# Patient Record
Sex: Female | Born: 1950
Health system: Southern US, Community
[De-identification: ages and names within clinical notes are randomized; demographics above are authoritative.]

## PROBLEM LIST (undated history)

## (undated) DIAGNOSIS — B1910 Unspecified viral hepatitis B without hepatic coma: Secondary | ICD-10-CM

## (undated) DIAGNOSIS — E785 Hyperlipidemia, unspecified: Secondary | ICD-10-CM

## (undated) DIAGNOSIS — I519 Heart disease, unspecified: Secondary | ICD-10-CM

## (undated) DIAGNOSIS — E119 Type 2 diabetes mellitus without complications: Secondary | ICD-10-CM

## (undated) DIAGNOSIS — I10 Essential (primary) hypertension: Secondary | ICD-10-CM

## (undated) HISTORY — DX: Type 2 diabetes mellitus without complications: E11.9

## (undated) HISTORY — DX: Hyperlipidemia, unspecified: E78.5

## (undated) HISTORY — DX: Unspecified viral hepatitis B without hepatic coma: B19.10

## (undated) HISTORY — DX: Heart disease, unspecified: I51.9

## (undated) HISTORY — PX: ABDOMINAL HYSTERECTOMY: SHX81

## (undated) HISTORY — DX: Essential (primary) hypertension: I10

---

## 2012-04-24 ENCOUNTER — Ambulatory Visit (INDEPENDENT_AMBULATORY_CARE_PROVIDER_SITE_OTHER): Payer: BC Managed Care – PPO | Admitting: Family Medicine

## 2012-04-24 ENCOUNTER — Other Ambulatory Visit: Payer: Self-pay | Admitting: Radiology

## 2012-04-24 ENCOUNTER — Ambulatory Visit: Payer: BC Managed Care – PPO

## 2012-04-24 VITALS — BP 172/100 | HR 82 | Temp 98.2°F | Resp 18 | Ht 59.5 in | Wt 135.0 lb

## 2012-04-24 DIAGNOSIS — R509 Fever, unspecified: Secondary | ICD-10-CM

## 2012-04-24 DIAGNOSIS — M79609 Pain in unspecified limb: Secondary | ICD-10-CM

## 2012-04-24 DIAGNOSIS — R05 Cough: Secondary | ICD-10-CM

## 2012-04-24 DIAGNOSIS — R059 Cough, unspecified: Secondary | ICD-10-CM

## 2012-04-24 DIAGNOSIS — J020 Streptococcal pharyngitis: Secondary | ICD-10-CM

## 2012-04-24 LAB — POCT CBC
HCT, POC: 39 % (ref 37.7–47.9)
Hemoglobin: 12.2 g/dL (ref 12.2–16.2)
Lymph, poc: 1.7 (ref 0.6–3.4)
MCH, POC: 29.6 pg (ref 27–31.2)
MCHC: 31.3 g/dL — AB (ref 31.8–35.4)
MCV: 94.6 fL (ref 80–97)
WBC: 4.1 10*3/uL — AB (ref 4.6–10.2)

## 2012-04-24 MED ORDER — MUCINEX DM 30-600 MG PO TB12: 1.0000 | ORAL_TABLET | Freq: Two times a day (BID) | ORAL | Status: DC | PRN

## 2012-04-24 MED ORDER — MELOXICAM 15 MG PO TABS: 15.0000 mg | ORAL_TABLET | Freq: Every day | ORAL | Status: DC

## 2012-04-24 MED ORDER — AZITHROMYCIN 250 MG PO TABS: ORAL_TABLET | ORAL | Status: DC

## 2012-04-24 NOTE — Patient Instructions (Addendum)
Fever, unspecified - Plan: POCT CBC, POCT Influenza A/B, DG Chest 2 View  Cough - Plan: POCT CBC, POCT Influenza A/B, DG Chest 2 View, azithromycin (ZITHROMAX Z-PAK) 250 MG tablet, Dextromethorphan-Guaifenesin (MUCINEX DM) 30-600 MG TB12  Streptococcal sore throat - Plan: POCT CBC, POCT Influenza A/B, DG Chest 2 View  Hand pain, unspecified laterality - Plan: meloxicam (MOBIC) 15 MG tablet    1.  Return in one month for recheck blood pressure.

## 2012-04-24 NOTE — Progress Notes (Signed)
[address]   [place], [place]  [address]   [phone]  Subjective:    Patient ID: Dominique Hayes, female    DOB: [birth year], 62 y.o.   MRN: [id number]  HPI This 62 y.o. female presents for evaluation of fever, cough.  Onset four days ago.  +fever; +cough with sputum production.  Has not checked temperpature; feels warm.  No ear pain; +ST painful.+rhionrrhea; +nasal congestion; +cough.  No v/d.  Works at [employer].  No medications at home.  From [place].  No tobacco.  No flu vaccine this year.  2.  Elevated blood pressure: has been told blood pressure elevated in past; no previous treatment.  3.  B hand pain:  Mentioned at very end of visit; onset one month ago; pain in DIP joints with swelling. Uses hands at work.  PCP: none   Review of Systems  Constitutional: Positive for fever and fatigue. Negative for chills and diaphoresis.  HENT: Positive for congestion, sore throat and trouble swallowing. Negative for ear pain, drooling and voice change.   Respiratory: Positive for cough. Negative for shortness of breath and stridor.   Gastrointestinal: Negative for nausea, vomiting and diarrhea.  Musculoskeletal: Positive for joint swelling and arthralgias.  Skin: Negative for pallor.    History reviewed. No pertinent past medical history.  Past Surgical History  Procedure Laterality Date  . Abdominal hysterectomy      Prior to Admission medications   Not on File    No Known Allergies  History   Social History  . Marital Status: Married    Spouse Name: N/A    Number of Children: N/A  . Years of Education: N/A   Occupational History  . Not on file.   Social History Main Topics  . Smoking status: Never Smoker   . Smokeless tobacco: Not on file  . Alcohol Use: No  . Drug Use: No  . Sexually Active: Yes   Other Topics Concern  . 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    Family  History  Problem Relation Age of Onset  . Hypertension Mother   . Hypertension Father   . Hypertension Sister   . Hypertension Sister        Objective:   Physical Exam  Nursing note and vitals reviewed. Constitutional: She is oriented to person, place, and time. She appears well-developed and well-nourished. No distress.  HENT:  Head: Normocephalic and atraumatic.  Right Ear: External ear normal.  Left Ear: External ear normal.  Nose: Nose normal.  Mouth/Throat: Oropharynx is clear and moist.  Eyes: Conjunctivae are normal. Pupils are equal, round, and reactive to light.  Neck: Normal range of motion. Neck supple.  Cardiovascular: Normal rate, regular rhythm and normal heart sounds.   Pulmonary/Chest: Effort normal and breath sounds normal.  Musculoskeletal:       Right hand: She exhibits decreased range of motion and swelling.       Left hand: She exhibits decreased range of motion and swelling. She exhibits no tenderness.  B HANDS: MILD SWELLING DIP, PIP JOINTS FINGERS THROUGHOUT; NON-TENDER ALONG METACARPALS AND CARPAL REGIONS.  Lymphadenopathy:    She has no cervical adenopathy.  Neurological: She is alert and oriented to person, place, and time.  Skin: Skin is warm and dry. She is not diaphoretic.  Psychiatric: She has a normal mood and affect. Her behavior is normal.  Results for orders placed in visit on 04/24/12  POCT CBC      Result Value Range   WBC 4.1 (*) 4.6 - 10.2 K/uL   Lymph, poc 1.7  0.6 - 3.4   POC LYMPH PERCENT 42.0  10 - 50 %L   MID (cbc) 0.4  0 - 0.9   POC MID % 10.8  0 - 12 %M   POC Granulocyte 1.9 (*) 2 - 6.9   Granulocyte percent 47.2  37 - 80 %G   RBC 4.12  4.04 - 5.48 M/uL   Hemoglobin 12.2  12.2 - 16.2 g/dL   HCT, POC 16.1  09.6 - 47.9 %   MCV 94.6  80 - 97 fL   MCH, POC 29.6  27 - 31.2 pg   MCHC 31.3 (*) 31.8 - 35.4 g/dL   RDW, POC 04.5     Platelet Count, POC 263  142 - 424 K/uL   MPV 7.8  0 - 99.8 fL  POCT INFLUENZA A/B      Result  Value Range   Influenza A, POC Negative     Influenza B, POC Negative     [facility] reading (PRIMARY) by  Dr. Katrinka Blazing.  CXR: small RML infiltrate?  Over-read by radiology: likely fat pad; no infiltrate.      Assessment & Plan:  Fever, unspecified - Plan: POCT CBC, POCT Influenza A/B, DG Chest 2 View  Cough - Plan: POCT CBC, POCT Influenza A/B, DG Chest 2 View, azithromycin (ZITHROMAX Z-PAK) 250 MG tablet, Dextromethorphan-Guaifenesin (MUCINEX DM) 30-600 MG TB12  Streptococcal sore throat - Plan: POCT CBC, POCT Influenza A/B, DG Chest 2 View  Hand pain, unspecified laterality - Plan: meloxicam (MOBIC) 15 MG tablet   1.  URI:  New.  Due to language barrier, treat with Zithromax, Mucinex DM bid.  Supportive care with rest, fluids.  RTC for acute worsening. 2.  Pain Hands B: New. Onset in past month; rx for Mobic 15mg  provided.  No further work up performed; recommend xray hand next month if persistent symptoms. 3.  Elevated blood pressure without diagnosis:  New.  History of elevated blood pressure per husband; RTC one month for BP recheck after resolution of acute illness.  Asymptomatic.  Meds ordered this encounter  Medications  . azithromycin (ZITHROMAX Z-PAK) 250 MG tablet    Sig: Two tablets daily x 1 day, then one tablet daily x 4 days; Vietnemese label if available.    Dispense:  6 each    Refill:  0  . Dextromethorphan-Guaifenesin (MUCINEX DM) 30-600 MG TB12    Sig: Take 1 tablet by mouth 2 (two) times daily as needed.    Dispense:  28 each    Refill:  0  . meloxicam (MOBIC) 15 MG tablet    Sig: Take 1 tablet (15 mg total) by mouth daily. For hand pain; Vietnemese label if available    Dispense:  30 tablet    Refill:  1

## 2012-10-13 ENCOUNTER — Ambulatory Visit (INDEPENDENT_AMBULATORY_CARE_PROVIDER_SITE_OTHER): Payer: BC Managed Care – PPO | Admitting: Emergency Medicine

## 2012-10-13 ENCOUNTER — Other Ambulatory Visit: Payer: Self-pay | Admitting: Emergency Medicine

## 2012-10-13 ENCOUNTER — Telehealth: Payer: Self-pay | Admitting: Radiology

## 2012-10-13 VITALS — BP 140/90 | HR 78 | Temp 99.0°F | Resp 16 | Ht 58.5 in | Wt 139.4 lb

## 2012-10-13 DIAGNOSIS — I10 Essential (primary) hypertension: Secondary | ICD-10-CM

## 2012-10-13 DIAGNOSIS — Z789 Other specified health status: Secondary | ICD-10-CM

## 2012-10-13 DIAGNOSIS — Z609 Problem related to social environment, unspecified: Secondary | ICD-10-CM

## 2012-10-13 DIAGNOSIS — Z23 Encounter for immunization: Secondary | ICD-10-CM

## 2012-10-13 DIAGNOSIS — R9431 Abnormal electrocardiogram [ECG] [EKG]: Secondary | ICD-10-CM

## 2012-10-13 LAB — LIPID PANEL
Cholesterol: 255 mg/dL — ABNORMAL HIGH (ref 0–200)
HDL: 46 mg/dL (ref >39)
LDL Cholesterol: 160 mg/dL — ABNORMAL HIGH (ref 0–99)
Total CHOL/HDL Ratio: 5.5 Ratio
Triglycerides: 243 mg/dL — ABNORMAL HIGH (ref <150)
VLDL: 49 mg/dL — ABNORMAL HIGH (ref 0–40)

## 2012-10-13 LAB — POCT CBC
Granulocyte percent: 67.5 %G (ref 37–80)
MCV: 97.8 fL — AB (ref 80–97)
MID (cbc): 0.5 (ref 0–0.9)
MPV: 8.2 fL (ref 0–99.8)
POC MID %: 6.3 %M (ref 0–12)
Platelet Count, POC: 335 10*3/uL (ref 142–424)
RBC: 4.83 M/uL (ref 4.04–5.48)

## 2012-10-13 LAB — COMPREHENSIVE METABOLIC PANEL
Albumin: 4.5 g/dL (ref 3.5–5.2)
BUN: 14 mg/dL (ref 6–23)
Calcium: 10.3 mg/dL (ref 8.4–10.5)
Chloride: 98 mEq/L (ref 96–112)
Glucose, Bld: 143 mg/dL — ABNORMAL HIGH (ref 70–99)
Potassium: 4.2 mEq/L (ref 3.5–5.3)
Total Protein: 7.9 g/dL (ref 6.0–8.3)

## 2012-10-13 MED ORDER — LOSARTAN POTASSIUM-HCTZ 50-12.5 MG PO TABS: 1.0000 | ORAL_TABLET | Freq: Every day | ORAL | Status: DC

## 2012-10-13 MED ORDER — ASPIRIN EC 81 MG PO TBEC: 81.0000 mg | DELAYED_RELEASE_TABLET | Freq: Every day | ORAL | Status: DC

## 2012-10-13 MED ORDER — NITROGLYCERIN 0.4 MG SL SUBL: 0.4000 mg | SUBLINGUAL_TABLET | SUBLINGUAL | Status: DC | PRN

## 2012-10-13 MED ORDER — METOPROLOL TARTRATE 25 MG PO TABS: 25.0000 mg | ORAL_TABLET | Freq: Two times a day (BID) | ORAL | Status: DC

## 2012-10-13 NOTE — Patient Instructions (Addendum)
[address], [place], [place] [address] phone [phone], Dr Jacinto Halim will call your son tomorrow with an appointment to be seen, he is the cardiologist.     Influenza Vaccine (Flu Vaccine, Inactivated) 2013 2014 What You Need to Know WHY GET VACCINATED?  Influenza ("flu") is a contagious disease that spreads around the [place] every winter, usually between October and May.  Flu is caused by the influenza virus, and can be spread by coughing, sneezing, and close contact.  Anyone can get flu, but the risk of getting flu is highest among children. Symptoms come on suddenly and may last several days. They can include:  Fever or chills.  Sore throat.  Muscle aches.  Fatigue.  Cough.  Headache.  Runny or stuffy nose. Flu can make some people much sicker than others. These people include young children, people 50 and older, pregnant women, and people with certain health conditions such as heart, lung or kidney disease, or a weakened immune system. Flu vaccine is especially important for these people, and anyone in close contact with them. Flu can also lead to pneumonia, and make existing medical conditions worse. It can cause diarrhea and seizures in children. Each year thousands of people in the [place] die from flu, and many more are hospitalized. Flu vaccine is the best protection we have from flu and its complications. Flu vaccine also helps prevent spreading flu from person to person. INACTIVATED FLU VACCINE There are 2 types of influenza vaccine:  You are getting an inactivated flu vaccine, which does not contain any live influenza virus. It is given by injection with a needle, and often called the "flu shot."  A different live, attenuated (weakened) influenza vaccine is sprayed into the nostrils. This vaccine is described in a separate Vaccine Information Statement. Flu vaccine is recommended every year. Children 6 months through 23 years of age should get  2 doses the first year they get vaccinated. Flu viruses are always changing. Each year's flu vaccine is made to protect from viruses that are most likely to cause disease that year. While flu vaccine cannot prevent all cases of flu, it is our best defense against the disease. Inactivated flu vaccine protects against 3 or 4 different influenza viruses. It takes about 2 weeks for protection to develop after the vaccination, and protection lasts several months to a year. Some illnesses that are not caused by influenza virus are often mistaken for flu. Flu vaccine will not prevent these illnesses. It can only prevent influenza. A "high-dose" flu vaccine is available for people 10 years of age and older. The person giving you the vaccine can tell you more about it. Some inactivated flu vaccine contains a very small amount of a mercury-based preservative called thimerosal. Studies have shown that thimerosal in vaccines is not harmful, but flu vaccines that do not contain a preservative are available. SOME PEOPLE SHOULD NOT GET THIS VACCINE Tell the person who gives you the vaccine:  If you have any severe (life-threatening) allergies. If you ever had a life-threatening allergic reaction after a dose of flu vaccine, or have a severe allergy to any part of this vaccine, you may be advised not to get a dose. Most, but not all, types of flu vaccine contain a small amount of egg.  If you ever had Guillain Barr Syndrome (a severe paralyzing illness, also called GBS). Some people with a history of GBS should not get this vaccine. This should be discussed with your doctor.  If you are not feeling well. They might suggest waiting until you feel better. But you should come back. RISKS OF A VACCINE REACTION With a vaccine, like any medicine, there is a chance of side effects. These are usually mild and go away on their own. Serious side effects are also possible, but are very rare. Inactivated flu vaccine does not  contain live flu virus, sogetting flu from this vaccine is not possible. Brief fainting spells and related symptoms (such as jerking movements) can happen after any medical procedure, including vaccination. Sitting or lying down for about 15 minutes after a vaccination can help prevent fainting and injuries caused by falls. Tell your doctor if you feel dizzy or lightheaded, or have vision changes or ringing in the ears. Mild problems following inactivated flu vaccine:  Soreness, redness, or swelling where the shot was given.  Hoarseness; sore, red or itchy eyes; or cough.  Fever.  Aches.  Headache.  Itching.  Fatigue. If these problems occur, they usually begin soon after the shot and last 1 or 2 days. Moderate problems following inactivated flu vaccine:  Young children who get inactivated flu vaccine and pneumococcal vaccine (PCV13) at the same time may be at increased risk for seizures caused by fever. Ask your doctor for more information. Tell your doctor if a child who is getting flu vaccine has ever had a seizure. Severe problems following inactivated flu vaccine:  A severe allergic reaction could occur after any vaccine (estimated less than 1 in a million doses).  There is a small possibility that inactivated flu vaccine could be associated with Guillan Barr Syndrome (GBS), no more than 1 or 2 cases per million people vaccinated. This is much lower than the risk of severe complications from flu, which can be prevented by flu vaccine. The safety of vaccines is always being monitored. For more information, visit: http://floyd.org/ WHAT IF THERE IS A SERIOUS REACTION? What should I look for?  Look for anything that concerns you, such as signs of a severe allergic reaction, very high fever, or behavior changes. Signs of a severe allergic reaction can include hives, swelling of the face and throat, difficulty breathing, a fast heartbeat, dizziness, and weakness. These would  start a few minutes to a few hours after the vaccination. What should I do?  If you think it is a severe allergic reaction or other emergency that cannot wait, call [phone] 1 1  or get the person to the nearest hospital. Otherwise, call your doctor.  Afterward, the reaction should be reported to the Vaccine Adverse Event Reporting System (VAERS). Your doctor might file this report, or you can do it yourself through the VAERS website at www.vaers.LAgents.no, or by calling 1-(514)819-7882. VAERS is only for reporting reactions. They do not give medical advice. THE NATIONAL VACCINE INJURY COMPENSATION PROGRAM The National Vaccine Injury Compensation Program (VICP) is a federal program that was created to compensate people who may have been injured by certain vaccines. Persons who believe they may have been injured by a vaccine can learn about the program and about filing a claim by calling 1-514-100-0118 or visiting the VICP website at SpiritualWord.at HOW CAN I LEARN MORE?  Ask your doctor.  Call your local or state health department.  Contact the Centers for Disease Control and Prevention (CDC):  Call 213-589-0002 (1-800-CDC-INFO) or  Visit CDC's website at BiotechRoom.com.cy CDC Inactivated Influenza Vaccine Interim VIS (08/11/11) Document Released: 10/27/2005 Document Revised: 09/27/2011 Document Reviewed: 08/11/2011 [facility] Patient Information 2014 [place], [place].

## 2012-10-13 NOTE — Progress Notes (Signed)
  Subjective:    Patient ID: Dominique Hayes, female    DOB: [birth year], 62 y.o.   MRN: [id number]  HPI Patient presents for blood pressure check. She has been taking her husbands Cozaar/ HCTZ. She has done well on this. Her BP today is 140/90. She states she would like a Rx for this medication so she does not have to continue using her husbands when she was seen here by Dr. Katrinka Blazing for an illness her blood pressure was significantly elevated at that time. She was advised to return to clinic for reevaluation.. Patient reports (husband interprets) 3 days ago patient felt very bad and had chest pains. States this began in the morning, she felt like her head was heavy. Her blood pressure was very elevated. Her husband gave her his blood pressure medication after this episode.  Today she reports she feels okay. She did not take the blood pressure medications today. Results for orders placed in visit on 10/13/12  POCT CBC      Result Value Range   WBC 8.7  4.6 - 10.2 K/uL   Lymph, poc 2.3  0.6 - 3.4   POC LYMPH PERCENT 26.2  10 - 50 %L   MID (cbc) 0.5  0 - 0.9   POC MID % 6.3  0 - 12 %M   POC Granulocyte 5.9  2 - 6.9   Granulocyte percent 67.5  37 - 80 %G   RBC 4.83  4.04 - 5.48 M/uL   Hemoglobin 15.4  12.2 - 16.2 g/dL   HCT, POC 13.0  86.5 - 47.9 %   MCV 97.8 (*) 80 - 97 fL   MCH, POC 31.9 (*) 27 - 31.2 pg   MCHC 32.6  31.8 - 35.4 g/dL   RDW, POC 78.4     Platelet Count, POC 335  142 - 424 K/uL   MPV 8.2  0 - 99.8 fL     Review of Systems     Objective:   Physical Exam patient is alert and cooperative she is not in any distress. Her neck is supple. There is no thyromegaly. Her chest was clear to auscultation and percussion. Cardiac exam is an S4 but no other murmurs or gallops abdomen was without masses or cells pedis posterior tibial pulses are 2+ with no edema.   EKG there T wave changes in V3 and V4     Assessment & Plan:  Abnormal EKG in a patient with a episode of palpitations lasting  1-2 minutes on Friday. She has a history of hypertension which has not been under treatment. We'll place the patient on metoprolol tartrate twice a day she will be on baby aspirin one a day she will also have nitroglycerin to take as needed for chest pain. She will follow up tomorrow with Dr. Jacinto Halim

## 2012-10-13 NOTE — Telephone Encounter (Signed)
Phone call to son from office. Dr Cleta Alberts wants to refer patient to cardiology. We have called [phone] to advise him of situation with his mother.

## 2012-10-14 LAB — HEPATITIS C ANTIBODY: HCV Ab: NEGATIVE

## 2012-10-14 LAB — HEPATITIS B SURF AG CONFIRMATION: Hepatitis B Surf Ag Confirmation: POSITIVE — AB

## 2012-10-18 ENCOUNTER — Other Ambulatory Visit: Payer: Self-pay | Admitting: Emergency Medicine

## 2012-10-18 ENCOUNTER — Ambulatory Visit (INDEPENDENT_AMBULATORY_CARE_PROVIDER_SITE_OTHER): Payer: BC Managed Care – PPO | Admitting: Emergency Medicine

## 2012-10-18 VITALS — BP 124/80 | HR 64 | Temp 98.0°F | Resp 16 | Ht 58.25 in | Wt 140.0 lb

## 2012-10-18 DIAGNOSIS — R7309 Other abnormal glucose: Secondary | ICD-10-CM

## 2012-10-18 DIAGNOSIS — E119 Type 2 diabetes mellitus without complications: Secondary | ICD-10-CM

## 2012-10-18 DIAGNOSIS — R748 Abnormal levels of other serum enzymes: Secondary | ICD-10-CM

## 2012-10-18 DIAGNOSIS — E1139 Type 2 diabetes mellitus with other diabetic ophthalmic complication: Secondary | ICD-10-CM | POA: Insufficient documentation

## 2012-10-18 DIAGNOSIS — E785 Hyperlipidemia, unspecified: Secondary | ICD-10-CM

## 2012-10-18 DIAGNOSIS — B1910 Unspecified viral hepatitis B without hepatic coma: Secondary | ICD-10-CM | POA: Insufficient documentation

## 2012-10-18 DIAGNOSIS — R7989 Other specified abnormal findings of blood chemistry: Secondary | ICD-10-CM

## 2012-10-18 DIAGNOSIS — R739 Hyperglycemia, unspecified: Secondary | ICD-10-CM

## 2012-10-18 LAB — HEPATITIS B SURFACE ANTIBODY,QUALITATIVE: Hep B S Ab: NEGATIVE

## 2012-10-18 NOTE — Progress Notes (Signed)
  Subjective:    Patient ID: Dominique Hayes, female    DOB: [birth year], 62 y.o.   MRN: [id number]  HPI patient in for followup. She was seen last week with palpitations chest discomfort and elevated blood pressure. She has been in to see Dr. Jacinto Halim. She is scheduled for an echocardiogram and a stress test. On screening labs she was found to have an elevated blood glucose as well as elevated liver function tests. Subsequent testing revealed her to be positive for hepatitis B surface antigen. She is in today for followup blood work.   Review of Systems     Objective:   Physical Exam Blood pressure is now under control at 124/80 her neck is supple her chest clear heart regular rate no murmurs. Results for orders placed in visit on 10/18/12  GLUCOSE, POCT (MANUAL RESULT ENTRY)      Result Value Range   POC Glucose 13 (*) 70 - 99 mg/dl  POCT GLYCOSYLATED HEMOGLOBIN (HGB A1C)      Result Value Range   Hemoglobin A1C 8.5         Assessment & Plan:  Today we did fasting glucose along with a hemoglobin A1c. I did confirmatory tests for her positive hepatitis B surface antigen. We will go ahead and check surface and core antibodies as well as e antigen and e antibodies. We'll go ahead and add an AFP because of the positive hepatitis B. Patient and her husband had to leave so we'll contact the son regarding Test Results. We'll go ahead also make referral to diabetic education.

## 2012-10-19 LAB — AFP TUMOR MARKER: AFP-Tumor Marker: 3.1 ng/mL (ref 0.0–8.0)

## 2012-10-20 LAB — HEPATITIS B CORE ANTIBODY, IGM: Hep B C IgM: NEGATIVE

## 2012-10-22 LAB — HEPATITIS B E ANTIGEN: Hepatitis Be Antigen: NEGATIVE

## 2012-10-23 LAB — HEPATITIS A ANTIBODY, IGM: Hep A IgM: NEGATIVE

## 2012-10-23 LAB — HEPATITIS A ANTIBODY, TOTAL: Hep A Total Ab: POSITIVE — AB

## 2012-11-10 ENCOUNTER — Ambulatory Visit (INDEPENDENT_AMBULATORY_CARE_PROVIDER_SITE_OTHER): Payer: BC Managed Care – PPO | Admitting: Family Medicine

## 2012-11-10 VITALS — BP 145/90 | HR 70 | Temp 98.6°F | Resp 18 | Wt 142.0 lb

## 2012-11-10 DIAGNOSIS — Z23 Encounter for immunization: Secondary | ICD-10-CM

## 2012-11-10 DIAGNOSIS — Z2839 Other underimmunization status: Secondary | ICD-10-CM

## 2012-11-10 DIAGNOSIS — Z283 Underimmunization status: Secondary | ICD-10-CM

## 2012-11-10 MED ORDER — VARICELLA-ZOSTER IMMUNE GLOB 125 UNITS IJ SOLR: 1.0000 "application " | Freq: Once | INTRAMUSCULAR | Status: DC

## 2012-11-10 NOTE — Progress Notes (Signed)
[facility]:  Office Visit  Chief Complaint:  Chief Complaint  Patient presents with  . Immunizations    wants tdap and shingles vaccine    HPI: Dominique Hayes is a 62 y.o. female who is here for  TDaP and shingles vaccine. No complaints. Had flu vaccine 2 weeks ago.  No fevers, chills, sick sxs.  On meds for hyperlipidemia and htn  Past Medical History  Diagnosis Date  . Hypertension   . Hyperlipidemia    Past Surgical History  Procedure Laterality Date  . Abdominal hysterectomy     History   Social History  . Marital Status: Married    Spouse Name: N/A    Number of Children: N/A  . Years of Education: N/A   Social History Main Topics  . Smoking status: Never Smoker   . Smokeless tobacco: None  . Alcohol Use: No  . Drug Use: No  . Sexual Activity: Yes   Other Topics Concern  . 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   Family History  Problem Relation Age of Onset  . Hypertension Mother   . Hypertension Father   . Hypertension Sister   . Hypertension Sister    No Known Allergies Prior to Admission medications   Medication Sig Start Date End Date Taking? Authorizing Provider  metoprolol tartrate (LOPRESSOR) 25 MG tablet Take 1 tablet (25 mg total) by mouth 2 (two) times daily. 10/13/12  Yes Collene Gobble, MD  nitroGLYCERIN (NITROSTAT) 0.4 MG SL tablet Place 1 tablet (0.4 mg total) under the tongue every 5 (five) minutes as needed for chest pain. 10/13/12  Yes Collene Gobble, MD  aspirin EC 81 MG tablet Take 1 tablet (81 mg total) by mouth daily. 10/13/12   Collene Gobble, MD  meloxicam (MOBIC) 15 MG tablet Take 1 tablet (15 mg total) by mouth daily. For hand pain; Vietnemese label if available 04/24/12   Ethelda Chick, MD  Varicella-Zoster Immune Glob 125 UNITS SOLR Inject 1 application as directed once. 11/10/12   Dessiree Sze P Patsy Zaragoza, DO     ROS: The patient denies  fevers, chills, night sweats, unintentional weight loss, chest pain, palpitations, wheezing, dyspnea on exertion, nausea, vomiting, abdominal pain, dysuria, hematuria, melena, numbness, weakness, or tingling.   All other systems have been reviewed and were otherwise negative with the exception of those mentioned in the HPI and as above.    PHYSICAL EXAM: Filed Vitals:   11/10/12 0807  BP: 145/90  Pulse: 70  Temp: 98.6 F (37 C)  Resp: 18   Filed Vitals:   11/10/12 0807  Weight: 142 lb (64.411 kg)   Body mass index is 29.41 kg/(m^2).  General: Alert, no acute distress HEENT:  Normocephalic, atraumatic, oropharynx patent. EOMI, PERRLA Cardiovascular:  Regular rate and rhythm, no rubs murmurs or gallops.  No Carotid bruits, radial pulse intact. No pedal edema.  Respiratory: Clear to auscultation bilaterally.  No wheezes, rales, or rhonchi.  No cyanosis, no use of accessory musculature GI: No organomegaly, abdomen is soft and non-tender, positive bowel sounds.  No masses. Skin: No rashes. Neurologic: Facial musculature symmetric. Psychiatric: Patient is appropriate throughout our interaction. Lymphatic: No cervical lymphadenopathy Musculoskeletal: Gait intact.   LABS: Results for orders placed in visit on 10/18/12  HEPATITIS B CORE ANTIBODY, IGM      Result Value Range   Hep B C  IgM NEG  NEGATIVE  HEPATITIS B E ANTIBODY      Result Value Range   Hepatitis Be Antibody Positive (*) Negative  HEPATITIS B E ANTIGEN      Result Value Range   Hepatitis Be Antigen Negative  Negative  HEPATITIS B SURFACE ANTIBODY      Result Value Range   Hep B S Ab NEG  NEGATIVE  AFP TUMOR MARKER      Result Value Range   AFP-Tumor Marker 3.1  0.0 - 8.0 ng/mL  GLUCOSE, POCT (MANUAL RESULT ENTRY)      Result Value Range   POC Glucose 113 (*) 70 - 99 mg/dl  POCT GLYCOSYLATED HEMOGLOBIN (HGB A1C)      Result Value Range   Hemoglobin A1C 8.5       EKG/XRAY:   Primary read interpreted by Dr.  Conley Rolls at [facility].   ASSESSMENT/PLAN: Encounter Diagnosis  Name Primary?  . Immunization deficiency Yes   Rx Shingles Vaccine [facility] She was given TDap today Has not taken her BP meds yet since has not eaten so slightly elevated, bp was 150/100 but recheck was lower F/u prn Gross sideeffects, risk and benefits, and alternatives of medications d/w patient. Patient is aware that all medications have potential sideeffects and we are unable to predict every sideeffect or drug-drug interaction that may occur.  Sherice Ijames PHUONG, DO 11/10/2012 8:35 AM

## 2012-11-10 NOTE — Progress Notes (Deleted)
Subjective:    Patient ID: Dominique Hayes, female    DOB: 1950-08-03, 62 y.o.   MRN: [id number]  HPI  [facility]:  Office Visit  Chief Complaint:  Chief Complaint  Patient presents with   Immunizations    wants tdap and shingles vaccine    HPI: Dominique Hayes is a 62 y.o. female who is here for  T-Dap vaccine and shingles vaccine. She has already had her flu shot. She states that she is not feeling ill and would just like the vaccinations.   No past medical history on file. 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   Family History  Problem Relation Age of Onset   Hypertension Mother    Hypertension Father    Hypertension Sister    Hypertension Sister    No Known Allergies Prior to Admission medications   Medication Sig Start Date End Date Taking? Authorizing Provider  metoprolol tartrate (LOPRESSOR) 25 MG tablet Take 1 tablet (25 mg total) by mouth 2 (two) times daily. 10/13/12  Yes Collene Gobble, MD  nitroGLYCERIN (NITROSTAT) 0.4 MG SL tablet Place 1 tablet (0.4 mg total) under the tongue every 5 (five) minutes as needed for chest pain. 10/13/12  Yes Collene Gobble, MD  aspirin EC 81 MG tablet Take 1 tablet (81 mg total) by mouth daily. 10/13/12   Collene Gobble, MD  meloxicam (MOBIC) 15 MG tablet Take 1 tablet (15 mg total) by mouth daily. For hand pain; Vietnemese label if available 04/24/12   Ethelda Chick, MD     ROS: The patient denies fevers, chills, night sweats, unintentional weight  loss, chest pain, palpitations, wheezing, dyspnea on exertion, nausea, vomiting, abdominal pain, dysuria, hematuria, melena, numbness, weakness, or tingling. ***  All other systems have been reviewed and were otherwise negative with the exception of those mentioned in the HPI and as above.    PHYSICAL EXAM: Filed Vitals:   11/10/12 0807  BP: 152/100  Pulse: 70  Temp: 98.6 F (37 C)  Resp: 18   Filed Vitals:   11/10/12 0807  Weight: 142 lb (64.411 kg)   Body mass index is 29.41 kg/(m^2).  General: Alert, no acute distress HEENT:  Normocephalic, atraumatic, oropharynx patent. EOMI, PERRLA Cardiovascular:  Regular rate and rhythm, no rubs murmurs or gallops.  No Carotid bruits, radial pulse intact. No pedal edema.  Respiratory: Clear to auscultation bilaterally.  No wheezes, rales, or rhonchi.  No cyanosis, no use of accessory musculature GI: No organomegaly, abdomen is soft and non-tender, positive bowel sounds.  No masses. Skin: No rashes. Neurologic: Facial musculature symmetric. Psychiatric: Patient is appropriate throughout our interaction. Lymphatic: No cervical lymphadenopathy Musculoskeletal: Gait intact.   LABS: Results for orders placed in visit on 10/18/12  HEPATITIS B CORE ANTIBODY, IGM      Result Value Range   Hep B C IgM NEG  NEGATIVE  HEPATITIS B E ANTIBODY      Result Value Range   Hepatitis Be Antibody Positive (*) Negative  HEPATITIS B E ANTIGEN      Result Value Range   Hepatitis Be Antigen Negative  Negative  HEPATITIS B SURFACE ANTIBODY      Result Value Range   Hep B S Ab NEG  NEGATIVE  AFP TUMOR MARKER      Result Value Range   AFP-Tumor Marker 3.1  0.0 - 8.0 ng/mL  GLUCOSE, POCT (MANUAL RESULT ENTRY)      Result Value Range   POC Glucose 113 (*) 70 - 99 mg/dl  POCT GLYCOSYLATED HEMOGLOBIN (HGB A1C)      Result Value Range   Hemoglobin A1C 8.5       EKG/XRAY:   Primary read interpreted by Dr. Conley Rolls at [facility].   ASSESSMENT/PLAN: No  diagnosis found.   Gross sideeffects, risk and benefits, and alternatives of medications d/w patient. Patient is aware that all medications have potential sideeffects and we are unable to predict every sideeffect or drug-drug interaction that may occur.  Callie A Corky Sox 11/10/2012 8:14 AM       Review of Systems     Objective:   Physical Exam        Assessment & Plan:

## 2012-12-28 ENCOUNTER — Other Ambulatory Visit: Payer: Self-pay | Admitting: Emergency Medicine

## 2013-03-11 ENCOUNTER — Ambulatory Visit: Payer: BC Managed Care – PPO | Admitting: Emergency Medicine

## 2013-05-15 ENCOUNTER — Ambulatory Visit: Payer: Self-pay | Admitting: Family Medicine

## 2013-05-23 ENCOUNTER — Telehealth: Payer: Self-pay

## 2013-05-23 NOTE — Telephone Encounter (Signed)
New Patient; [place]; Interpreter scheduled to interpret for patient.

## 2013-05-26 ENCOUNTER — Ambulatory Visit (INDEPENDENT_AMBULATORY_CARE_PROVIDER_SITE_OTHER): Payer: No Typology Code available for payment source | Admitting: Family Medicine

## 2013-05-26 ENCOUNTER — Encounter: Payer: Self-pay | Admitting: Internal Medicine

## 2013-05-26 ENCOUNTER — Encounter: Payer: Self-pay | Admitting: Family Medicine

## 2013-05-26 ENCOUNTER — Other Ambulatory Visit (HOSPITAL_COMMUNITY)
Admission: RE | Admit: 2013-05-26 | Discharge: 2013-05-26 | Disposition: A | Discharge status: Home / Self Care | Payer: No Typology Code available for payment source | Source: Ambulatory Visit | Attending: Family Medicine | Admitting: Family Medicine

## 2013-05-26 VITALS — BP 154/90 | HR 74 | Temp 98.1°F | Ht 59.5 in | Wt 134.0 lb

## 2013-05-26 DIAGNOSIS — R82998 Other abnormal findings in urine: Secondary | ICD-10-CM

## 2013-05-26 DIAGNOSIS — Z124 Encounter for screening for malignant neoplasm of cervix: Secondary | ICD-10-CM | POA: Insufficient documentation

## 2013-05-26 DIAGNOSIS — I519 Heart disease, unspecified: Secondary | ICD-10-CM

## 2013-05-26 DIAGNOSIS — R739 Hyperglycemia, unspecified: Secondary | ICD-10-CM

## 2013-05-26 DIAGNOSIS — R7309 Other abnormal glucose: Secondary | ICD-10-CM

## 2013-05-26 DIAGNOSIS — Z1239 Encounter for other screening for malignant neoplasm of breast: Secondary | ICD-10-CM

## 2013-05-26 DIAGNOSIS — E2839 Other primary ovarian failure: Secondary | ICD-10-CM

## 2013-05-26 DIAGNOSIS — Z1151 Encounter for screening for human papillomavirus (HPV): Secondary | ICD-10-CM | POA: Insufficient documentation

## 2013-05-26 DIAGNOSIS — L259 Unspecified contact dermatitis, unspecified cause: Secondary | ICD-10-CM

## 2013-05-26 DIAGNOSIS — N760 Acute vaginitis: Secondary | ICD-10-CM

## 2013-05-26 DIAGNOSIS — R8290 Unspecified abnormal findings in urine: Secondary | ICD-10-CM

## 2013-05-26 DIAGNOSIS — L309 Dermatitis, unspecified: Secondary | ICD-10-CM

## 2013-05-26 DIAGNOSIS — I10 Essential (primary) hypertension: Secondary | ICD-10-CM

## 2013-05-26 DIAGNOSIS — Z0000 Encounter for general adult medical examination without abnormal findings: Secondary | ICD-10-CM

## 2013-05-26 DIAGNOSIS — E785 Hyperlipidemia, unspecified: Secondary | ICD-10-CM

## 2013-05-26 LAB — CBC WITH DIFFERENTIAL/PLATELET
BASOS ABS: 0 10*3/uL (ref 0.0–0.1)
Basophils Relative: 0.6 % (ref 0.0–3.0)
Eosinophils Absolute: 0.2 10*3/uL (ref 0.0–0.7)
Eosinophils Relative: 3 % (ref 0.0–5.0)
HEMATOCRIT: 41.9 % (ref 36.0–46.0)
Hemoglobin: 14.3 g/dL (ref 12.0–15.0)
LYMPHS ABS: 2.4 10*3/uL (ref 0.7–4.0)
Lymphocytes Relative: 30.5 % (ref 12.0–46.0)
MCHC: 34.2 g/dL (ref 30.0–36.0)
MCV: 93.3 fl (ref 78.0–100.0)
MONO ABS: 0.5 10*3/uL (ref 0.1–1.0)
Monocytes Relative: 6.3 % (ref 3.0–12.0)
NEUTROS ABS: 4.8 10*3/uL (ref 1.4–7.7)
Neutrophils Relative %: 59.6 % (ref 43.0–77.0)
Platelets: 285 10*3/uL (ref 150.0–400.0)
RBC: 4.49 Mil/uL (ref 3.87–5.11)
RDW: 12.9 % (ref 11.5–15.5)
WBC: 8 10*3/uL (ref 4.0–10.5)

## 2013-05-26 LAB — POCT URINALYSIS DIPSTICK
Bilirubin, UA: NEGATIVE
Glucose, UA: NEGATIVE
Ketones, UA: NEGATIVE
Nitrite, UA: NEGATIVE
PH UA: 6
PROTEIN UA: NEGATIVE
Spec Grav, UA: 1.01
UROBILINOGEN UA: 0.2

## 2013-05-26 LAB — BASIC METABOLIC PANEL
BUN: 12 mg/dL (ref 6–23)
CHLORIDE: 100 meq/L (ref 96–112)
CO2: 28 meq/L (ref 19–32)
Calcium: 9.7 mg/dL (ref 8.4–10.5)
Creatinine, Ser: 0.7 mg/dL (ref 0.4–1.2)
GFR: 98.28 mL/min (ref >60.00)
Glucose, Bld: 177 mg/dL — ABNORMAL HIGH (ref 70–99)
POTASSIUM: 3.4 meq/L — AB (ref 3.5–5.1)
Sodium: 136 mEq/L (ref 135–145)

## 2013-05-26 LAB — LIPID PANEL
CHOLESTEROL: 174 mg/dL (ref 0–200)
HDL: 35.8 mg/dL — ABNORMAL LOW (ref >39.00)
LDL Cholesterol: 106 mg/dL — ABNORMAL HIGH (ref 0–99)
TRIGLYCERIDES: 159 mg/dL — AB (ref 0.0–149.0)
Total CHOL/HDL Ratio: 5
VLDL: 31.8 mg/dL (ref 0.0–40.0)

## 2013-05-26 LAB — HEMOGLOBIN A1C: Hgb A1c MFr Bld: 10.3 % — ABNORMAL HIGH (ref 4.6–6.5)

## 2013-05-26 LAB — HEPATIC FUNCTION PANEL
ALBUMIN: 4.3 g/dL (ref 3.5–5.2)
ALT: 57 U/L — AB (ref 0–35)
AST: 46 U/L — ABNORMAL HIGH (ref 0–37)
Alkaline Phosphatase: 43 U/L (ref 39–117)
Bilirubin, Direct: 0 mg/dL (ref 0.0–0.3)
TOTAL PROTEIN: 7.5 g/dL (ref 6.0–8.3)
Total Bilirubin: 1 mg/dL (ref 0.2–1.2)

## 2013-05-26 LAB — TSH: TSH: 1.05 u[IU]/mL (ref 0.35–4.50)

## 2013-05-26 MED ORDER — MOMETASONE FUROATE 0.1 % EX CREA: 1.0000 "application " | TOPICAL_CREAM | Freq: Every day | CUTANEOUS | Status: DC

## 2013-05-26 MED ORDER — NYSTATIN 100000 UNIT/GM EX CREA: 1.0000 "application " | TOPICAL_CREAM | Freq: Two times a day (BID) | CUTANEOUS | Status: DC

## 2013-05-26 MED ORDER — CHLORTHALIDONE 25 MG PO TABS: 25.0000 mg | ORAL_TABLET | Freq: Every day | ORAL | Status: DC

## 2013-05-26 NOTE — Progress Notes (Signed)
Pre visit review using our clinic review tool, if applicable. No additional management support is needed unless otherwise documented below in the visit note. 

## 2013-05-26 NOTE — Patient Instructions (Signed)
Preventing Constipation After Surgery Constipation is when a person has fewer than 3 bowel movements a week; has difficulty having a bowel movement; or has stools that are dry, hard, or larger than normal. Many things can make constipation likely after surgery. They include:  Medications, especially numbing medications (anesthetics) and very strong pain medications called narcotics.  Feeling stressed because of the surgery.  Eating different foods than normal.  Being less active. Symptoms of constipation include:  Having fewer than 3 bowel movements a week.  Straining to have a bowel movement.  Having hard, dry, or larger-than-normal stools.  Feeling full or bloated.  Having pain in the lower abdomen.  Not feeling relief after having a bowel movement. HOME CARE INSTRUCTIONS  Diet  Eat foods that have a lot of fiber. These include fruits, vegetables, whole grains, and beans. Limit foods high in fat and processed sugars. These include french fries, hamburgers, cookies, and candy.  Take a fiber supplement as directed. If you are not taking a fiber supplement and think that you are not getting enough fiber from foods, talk to your caregiver about adding a fiber supplement to your diet.  Drink clear fluids, especially water. Avoid drinking alcohol, caffeine, and soda. These can make constipation worse.  Drink enough fluids to keep your urine clear or pale yellow. Activity   After surgery, return to your normal activities slowly or when your caregiver says it is okay.  Start walking as soon as you can. Try to go a little farther each day.  Once your caregiver approves, do some sort of regular exercise. This helps prevent constipation. Bowel Movements  Go to the restroom when you have the urge to go. Do not hold it in.  Try drinking something hot to get a bowel movement started.  Keep track of how often you use the restroom. If you miss 2 3 bowel movements, talk to your  caregiver about medications that prevent constipation. Your caregiver may suggest a stool softener, laxative, or fiber supplement.  Only take over-the-counter or prescription medications as directed by your caregiver.  Do not take other medications without talking to your caregiver first. If you become constipated and take a medication to make you have a bowel movement, the problem may get worse. Other kinds of medication can also make the problem worse. SEEK MEDICAL CARE IF:  You used stool softeners or laxatives and still have not had a bowel movement within 24 48 hours after using them.  You have not had a bowel movement in 3 days. SEEK IMMEDIATE MEDICAL CARE IF:   Your constipation lasts for more than 4 days or gets worse.  You have bright red blood in your stool.  You have abdominal or rectal pain.  You have very bad cramping.  You have thin, pencil-like stools.  You have unexplained weight loss.  You have a fever or persistent symptoms for more than 2 3 days.  You have a fever and your symptoms suddenly get worse. MAKE SURE YOU:  Understand these instructions.  Will watch your condition.  Will get help right away if you are not doing well or get worse. Document Released: 04/29/2012 Document Reviewed: 04/29/2012 [facility] Patient Information 2014 [place], [place].

## 2013-05-26 NOTE — Addendum Note (Signed)
Addended by: Silvio PateHOMPSON, Eulanda Dorion D on: 05/26/2013 03:30 PM   Modules accepted: Orders

## 2013-05-26 NOTE — Progress Notes (Signed)
Subjective:     Dominique Hayes is a 63 y.o. female and is here for a comprehensive physical exam. The patient reports problems - rash on arms and legs.  Translator is present.   History   Social History  . Marital Status: Married    Spouse Name: N/A    Number of Children: N/A  . Years of Education: N/A   Occupational History  . Not on file.   Social History Main Topics  . Smoking status: Never Smoker   . Smokeless tobacco: Not on file  . Alcohol Use: No  . Drug Use: No  . Sexual Activity: Yes   Other Topics Concern  . Not on file   Social History Narrative   Marital status: married      Employment: employed; p/t [employer]   Exercise--- treadmill everday for 30 min   Health Maintenance  Topic Date Due  . Pap Smear  10/05/1969  . Mammogram  10/05/2001  . Colonoscopy  10/05/2001  . Influenza Vaccine  08/16/2013  . Tetanus/tdap  11/11/2022  . Zostavax  Completed    The following portions of the patient's history were reviewed and updated as appropriate:  She  has a past medical history of Hypertension and Hyperlipidemia. She  does not have any pertinent problems on file. She  has past surgical history that includes Abdominal hysterectomy. Her family history includes Hypertension in her father, mother, sister, and sister. She  reports that she has never smoked. She does not have any smokeless tobacco history on file. She reports that she does not drink alcohol or use illicit drugs. She has a current medication list which includes the following prescription(s): atorvastatin, chlorthalidone, [facility] aspirin low dose, meloxicam, metoprolol tartrate, nitroglycerin, ramipril, varicella-zoster immune glob, mometasone, and nystatin cream. Current Outpatient Prescriptions on File Prior to Visit  Medication Sig Dispense Refill  . [facility] ASPIRIN LOW DOSE 81 MG EC tablet TAKE 1 TABLET BY MOUTH DAILY.  30 tablet  3  . meloxicam (MOBIC) 15 MG tablet Take 1 tablet (15 mg total) by mouth daily.  For hand pain; Vietnemese label if available  30 tablet  1  . metoprolol tartrate (LOPRESSOR) 25 MG tablet Take 1 tablet (25 mg total) by mouth 2 (two) times daily.  60 tablet  2  . nitroGLYCERIN (NITROSTAT) 0.4 MG SL tablet Place 1 tablet (0.4 mg total) under the tongue every 5 (five) minutes as needed for chest pain.  30 tablet  0  . Varicella-Zoster Immune Glob 125 UNITS SOLR Inject 1 application as directed once.  1 each  0   No current facility-administered medications on file prior to visit.   She has No Known Allergies..  Review of Systems Review of Systems  Constitutional: Negative for activity change, appetite change and fatigue.  HENT: Negative for hearing loss, congestion, tinnitus and ear discharge.  dentist q181m Eyes: Negative for visual disturbance (see optho q1y -- vision corrected to 20/20 with glasses).  Respiratory: Negative for cough, chest tightness and shortness of breath.   Cardiovascular: Negative for chest pain, palpitations and leg swelling.  Gastrointestinal: Negative for abdominal pain, diarrhea, constipation and abdominal distention.  Genitourinary: Negative for urgency, frequency, decreased urine volume and difficulty urinating.  Musculoskeletal: Negative for back pain, arthralgias and gait problem.  Skin: Negative for color change, pallor and rash.  Neurological: Negative for dizziness, light-headedness, numbness and headaches.  Hematological: Negative for adenopathy. Does not bruise/bleed easily.  Psychiatric/Behavioral: Negative for suicidal ideas, confusion, sleep disturbance,  self-injury, dysphoric mood, decreased concentration and agitation.      Objective:    BP 154/90  Pulse 74  Temp(Src) 98.1 F (36.7 C) (Oral)  Ht 4' 11.5" (1.511 m)  Wt 134 lb (60.782 kg)  BMI 26.62 kg/m2  SpO2 98% General appearance: alert, cooperative and appears stated age Head: Normocephalic, without obvious abnormality, atraumatic Eyes: conjunctivae/corneas clear.  PERRL, EOM's intact. Fundi benign. Ears: normal TM's and external ear canals both ears Nose: Nares normal. Septum midline. Mucosa normal. No drainage or sinus tenderness. Throat: lips, mucosa, and tongue normal; teeth and gums normal Neck: no adenopathy, no carotid bruit, no JVD, supple, symmetrical, trachea midline and thyroid not enlarged, symmetric, no tenderness/mass/nodules Back: symmetric, no curvature. ROM normal. No CVA tenderness. Lungs: clear to auscultation bilaterally Breasts: normal appearance, no masses or tenderness Heart: regular rate and rhythm, S1, S2 normal, no murmur, click, rub or gallop Abdomen: soft, non-tender; bowel sounds normal; no masses,  no organomegaly Pelvic: cervix normal in appearance, external genitalia normal, no adnexal masses or tenderness, no cervical motion tenderness, rectovaginal septum normal, uterus normal size, shape, and consistency, vagina normal without discharge and pap done Extremities: extremities normal, atraumatic, no cyanosis or edema Pulses: 2+ and symmetric Skin: fine papular rash on arms and legs Lymph nodes: Cervical, supraclavicular, and axillary nodes normal. Neurologic: Alert and oriented X 3, normal strength and tone. Normal symmetric reflexes. Normal coordination and gait Psych- no depression, no anxiety      Assessment:    Healthy female exam.      Plan:    ghm utd Check labs See After Visit Summary for Counseling Recommendations   1. Dermatitis  - mometasone (ELOCON) 0.1 % cream; Apply 1 application topically daily.  Dispense: 45 g; Refill: 0  2. HTN (hypertension) stable - chlorthalidone (HYGROTON) 25 MG tablet; Take 1 tablet (25 mg total) by mouth daily.  Dispense: 90 tablet; Refill: 3 - Basic metabolic panel - CBC with Differential - POCT urinalysis dipstick - TSH  3. Other and unspecified hyperlipidemia con't meds - Hepatic function panel - Lipid panel  4. Preventative health care  - Ambulatory  referral to Gastroenterology  5. Estrogen deficiency  - DG Bone Density; Future  6. Other screening breast examination  - MM DIGITAL SCREENING BILATERAL; Future  7. Hyperglycemia  - Hemoglobin A1c  8. Mild diastolic dysfunction   9. Vaginitis and vulvovaginitis  - nystatin cream (MYCOSTATIN); Apply 1 application topically 2 (two) times daily.  Dispense: 30 g; Refill: 0  10. Screening for malignant neoplasm of the cervix  - Cytology - PAP

## 2013-05-27 ENCOUNTER — Telehealth: Payer: Self-pay | Admitting: Family Medicine

## 2013-05-27 NOTE — Telephone Encounter (Signed)
Relevant patient education mailed to patient.  

## 2013-05-28 ENCOUNTER — Telehealth: Payer: Self-pay

## 2013-05-28 LAB — URINE CULTURE: Colony Count: 70000

## 2013-05-28 MED ORDER — NITROGLYCERIN 0.4 MG SL SUBL
0.4000 mg | SUBLINGUAL_TABLET | SUBLINGUAL | Status: DC | PRN
Start: 2013-05-28 — End: 2014-10-16

## 2013-05-28 MED ORDER — METOPROLOL TARTRATE 25 MG PO TABS: 25.0000 mg | ORAL_TABLET | Freq: Two times a day (BID) | ORAL | Status: DC

## 2013-05-28 MED ORDER — RAMIPRIL 10 MG PO CAPS: 10.0000 mg | ORAL_CAPSULE | Freq: Every day | ORAL | Status: DC

## 2013-05-28 MED ORDER — MELOXICAM 15 MG PO TABS: 15.0000 mg | ORAL_TABLET | Freq: Every day | ORAL | Status: DC

## 2013-05-28 NOTE — Telephone Encounter (Signed)
Patient's husband walked into clinic requesting refills on his wife's behalf.  Interpreter language line was called to assist with this visit.  Husband expressed that patient needed the following refills:  Meloxicam 15 mg   Metoprolol tartrate 25 mg  Nitroglycerin 0.4 mg SL Tablets  Ramipril 10 mg capsule  Last OV:  05/26/2013 Labs: 05/26/2013  Medications reviewed with Dr. Laury AxonLowne and she ok'd to refill.    Medications refilled per prescription refill protocol [facility] [address].  Family aware.

## 2013-05-29 ENCOUNTER — Telehealth: Payer: Self-pay

## 2013-05-29 MED ORDER — METFORMIN HCL ER 500 MG PO TB24: 500.0000 mg | ORAL_TABLET | Freq: Every evening | ORAL | Status: DC

## 2013-05-29 MED ORDER — ATORVASTATIN CALCIUM 20 MG PO TABS: 10.0000 mg | ORAL_TABLET | Freq: Every day | ORAL | Status: DC

## 2013-05-29 NOTE — Telephone Encounter (Signed)
Spoke with Dominique Hayes along with an interpreter and he voiced understanding. Rx has been sent to the pharmacy. Ans apt scheduled for 06/06/13 at 8:15         KP

## 2013-05-29 NOTE — Telephone Encounter (Signed)
Message copied by Arnette NorrisPAYNE, Hasnain Manheim P on Thu May 29, 2013  1:26 PM ------      Message from: Lelon PerlaLOWNE, YVONNE R      Created: Tue May 27, 2013  8:37 AM       Pt is diabetic--- start metformin xr 500 mg 1 po qpm #30  , 2 refills.    Pt needs ov with translator to discuss diabetes and glucometer.   Cholesterol--- LDL goal < 70,  HDL >40,  TG < 150.  Diet and exercise will increase HDL and decrease LDL and TG.  Fish,  Fish Oil, Flaxseed oil will also help increase the HDL and decrease Triglycerides.   Recheck labs in 3 months--   250.02  272.4  Lipid, hep, bmp, hgba1c---- refer to diabetic educator      Increase lipitor 20  Mg #30  1 po qhs, 2 refills      Recheck 3 months-.             ------

## 2013-06-03 ENCOUNTER — Other Ambulatory Visit: Payer: Self-pay

## 2013-06-03 DIAGNOSIS — R87629 Unspecified abnormal cytological findings in specimens from vagina: Secondary | ICD-10-CM

## 2013-06-06 ENCOUNTER — Encounter: Payer: Self-pay | Admitting: Family Medicine

## 2013-06-06 ENCOUNTER — Ambulatory Visit (INDEPENDENT_AMBULATORY_CARE_PROVIDER_SITE_OTHER): Payer: No Typology Code available for payment source | Admitting: Family Medicine

## 2013-06-06 VITALS — BP 138/90 | HR 56 | Temp 98.6°F | Wt 135.0 lb

## 2013-06-06 DIAGNOSIS — R87629 Unspecified abnormal cytological findings in specimens from vagina: Secondary | ICD-10-CM

## 2013-06-06 DIAGNOSIS — E1159 Type 2 diabetes mellitus with other circulatory complications: Secondary | ICD-10-CM

## 2013-06-06 NOTE — Patient Instructions (Signed)

## 2013-06-06 NOTE — Progress Notes (Signed)
   Subjective:    Patient ID: Dominique Hayes, female    DOB: [birth year], 63 y.o.   MRN: [id number]  HPI \ Pt here to discuss abn pap and glucometer training.      Review of Systems As above    Objective:   Physical Exam  BP 138/90  Pulse 56  Temp(Src) 98.6 F (37 C) (Oral)  Wt 135 lb (61.236 kg)  SpO2 97% General appearance: alert, cooperative, appears stated age and no distress Neck: no adenopathy, no carotid bruit, no JVD, supple, symmetrical, trachea midline and thyroid not enlarged, symmetric, no tenderness/mass/nodules Lungs: clear to auscultation bilaterally Heart: regular rate and rhythm, S1, S2 normal, no murmur, click, rub or gallop      Assessment & Plan:  1. Type II or unspecified type diabetes mellitus with peripheral circulatory disorders, uncontrolled(250.72) Labs reviewed and pt instructed on glucometer use And diet Pt started glucometer  2. Abnormal Pap smear of vagina Gyn referral pending

## 2013-06-10 ENCOUNTER — Telehealth: Payer: Self-pay | Admitting: Family Medicine

## 2013-06-10 MED ORDER — ONETOUCH DELICA LANCETS FINE MISC: Status: DC

## 2013-06-10 MED ORDER — GLUCOSE BLOOD VI STRP
ORAL_STRIP | Status: DC
Start: 2013-06-10 — End: 2013-07-08

## 2013-06-10 NOTE — Telephone Encounter (Signed)
Medication has been sent         KP 

## 2013-06-10 NOTE — Telephone Encounter (Signed)
Pt's spouse came in today requesting refill on one touch ultra test strips and needles.  Please send to [facility].  Please advise.

## 2013-06-16 ENCOUNTER — Ambulatory Visit: Payer: No Typology Code available for payment source | Admitting: Family Medicine

## 2013-06-19 ENCOUNTER — Telehealth: Payer: Self-pay | Admitting: Gynecology

## 2013-06-19 NOTE — Telephone Encounter (Signed)
Called patient to schedule a new patient doctor referral appointment.

## 2013-06-25 ENCOUNTER — Other Ambulatory Visit: Payer: Self-pay

## 2013-06-25 DIAGNOSIS — L309 Dermatitis, unspecified: Secondary | ICD-10-CM

## 2013-06-25 DIAGNOSIS — I10 Essential (primary) hypertension: Secondary | ICD-10-CM

## 2013-06-25 MED ORDER — ATORVASTATIN CALCIUM 20 MG PO TABS: 20.0000 mg | ORAL_TABLET | Freq: Every day | ORAL | Status: DC

## 2013-06-25 MED ORDER — METFORMIN HCL ER 500 MG PO TB24: 500.0000 mg | ORAL_TABLET | Freq: Every evening | ORAL | Status: DC

## 2013-06-25 MED ORDER — CHLORTHALIDONE 25 MG PO TABS: 25.0000 mg | ORAL_TABLET | Freq: Every day | ORAL | Status: DC

## 2013-06-25 MED ORDER — RAMIPRIL 10 MG PO CAPS: 10.0000 mg | ORAL_CAPSULE | Freq: Every day | ORAL | Status: DC

## 2013-06-25 MED ORDER — MOMETASONE FUROATE 0.1 % EX CREA: 1.0000 "application " | TOPICAL_CREAM | Freq: Every day | CUTANEOUS | Status: DC

## 2013-06-25 MED ORDER — METOPROLOL TARTRATE 25 MG PO TABS: 25.0000 mg | ORAL_TABLET | Freq: Two times a day (BID) | ORAL | Status: DC

## 2013-07-08 ENCOUNTER — Other Ambulatory Visit: Payer: Self-pay

## 2013-07-08 ENCOUNTER — Telehealth: Payer: Self-pay | Admitting: Gynecology

## 2013-07-08 MED ORDER — ONETOUCH DELICA LANCETS FINE MISC: Status: DC

## 2013-07-08 MED ORDER — GLUCOSE BLOOD VI STRP: ORAL_STRIP | Status: DC

## 2013-07-08 NOTE — Telephone Encounter (Signed)
Called patient and LMTCB to reschedule new patient doctor referral.

## 2013-07-08 NOTE — Telephone Encounter (Signed)
Patient's husband called back and said, "My wife is not ready to be seen right now." He will call Dr. Ernst SpellLowne's office when his wife is ready to be seen. Referring office notified.

## 2013-07-11 ENCOUNTER — Telehealth: Payer: Self-pay

## 2013-07-11 NOTE — Telephone Encounter (Signed)
Glucose reading received from the patient and the reading are still elevated.  Per Dr.Lowne increase Metformin XR 500 mg to 2 po qpm #60 with 2 refills. Continue to check glucose. I tried calling along with [place] interpreter and the line rang. I will try again later.     KP

## 2013-07-15 ENCOUNTER — Ambulatory Visit: Payer: BC Managed Care – PPO | Admitting: Gynecology

## 2013-07-22 ENCOUNTER — Telehealth: Payer: Self-pay | Admitting: *Deleted

## 2013-07-22 MED ORDER — GLUCOSE BLOOD VI STRP: ORAL_STRIP | Status: DC

## 2013-07-22 NOTE — Telephone Encounter (Signed)
Rx has been sent.       KP 

## 2013-07-22 NOTE — Telephone Encounter (Signed)
Caller name:  Tot Relation to pt:  husband Call back number:  [phone]  Pharmacy:  [facility]  Reason for call:   Pt husband came by office.  [facility] sent letter stating they need prescription for ONE TOUCH ULTRA STRP BLUE 50S.  Letter states Dr can fax prescription, call in new prescription to [phone], will need member ID (last (4) [id number] and DOB when calling, or send using e-prescribing system.  Letter states once they receive doctor's authorization, they will process immediately.  Reference number on bottom of letter is:  [id number].

## 2013-07-22 NOTE — Telephone Encounter (Signed)
I tried again to get the patient but there was no answer on the home number.    KP

## 2013-07-28 NOTE — Telephone Encounter (Signed)
After multiple failed attempts. Letter mailed     KP

## 2013-07-29 ENCOUNTER — Encounter: Payer: No Typology Code available for payment source | Admitting: Internal Medicine

## 2013-08-04 ENCOUNTER — Telehealth: Payer: Self-pay

## 2013-08-04 DIAGNOSIS — I10 Essential (primary) hypertension: Secondary | ICD-10-CM

## 2013-08-04 DIAGNOSIS — E119 Type 2 diabetes mellitus without complications: Secondary | ICD-10-CM

## 2013-08-04 NOTE — Telephone Encounter (Signed)
Diabetic bundle  Per md's lab note pt needs to repeat labs in 3 months. Pt has appt with md on 09-10-13  Will order labs for that date

## 2013-08-25 ENCOUNTER — Telehealth: Payer: Self-pay | Admitting: *Deleted

## 2013-08-25 NOTE — Telephone Encounter (Signed)
Where is the letter???    KP

## 2013-08-25 NOTE — Telephone Encounter (Signed)
Caller name: Tawanna SatLe Tot Relation to pt: husband Call back number: [phone] Pharmacy: [facility]  Reason for call:   Pt is still having issue getting the One Touch Ultra Strip Blue 100 from pharmacy.  Letter dated 07/29/13 states it requires certain authorizations before it can be filled.  Can you check on this and let the pt husband know at above number what they need to do?  Thank you.

## 2013-08-27 NOTE — Telephone Encounter (Signed)
Pt husband had it with him.  I did not make a copy.  I can call and get him to bring it if you need to see it.

## 2013-09-05 ENCOUNTER — Other Ambulatory Visit: Payer: Self-pay | Admitting: Family Medicine

## 2013-09-05 DIAGNOSIS — E1159 Type 2 diabetes mellitus with other circulatory complications: Secondary | ICD-10-CM

## 2013-09-05 MED ORDER — ONETOUCH DELICA LANCETS FINE MISC: Status: DC

## 2013-09-05 MED ORDER — GLUCOSE BLOOD VI STRP: ORAL_STRIP | Status: DC

## 2013-09-05 NOTE — Telephone Encounter (Signed)
Before changing patient's diabetic supplies, [facility] [address].

## 2013-09-05 NOTE — Telephone Encounter (Signed)
We can change to freestyle-- we should have a meter here

## 2013-09-05 NOTE — Telephone Encounter (Signed)
Husband was here in office today for an appointment.  During appointment, husband stated that wife still has not received the ONE TOUCH ULTRA STRIP BLUE 50s strips from [facility].  Called [facility].  Representative stated that the test strips have not been released to the patient due to a need for a verification of the patient's address.  Was told that once a verification is received, the strips can be released.  Called patient utilizing [employer] interpre[employer].  Husband answered the phone.  Address was verified.  Called [facility], new representative stated that an address verification was not needed, instead a prior authorization is needed.  Pt's insurance will only pay for Freestyle products unless the patient is on an insulin pump, is deaf, blind, or unable to use Freestyle products.     Change pt's diabetic supplies to Freestyle products?    Please advise.

## 2013-09-05 NOTE — Telephone Encounter (Signed)
[facility] [address].  Called patient utilizing [employer].  Pt is aware and is in agreement with plan.  He was encouraged to notify us of any problems.  He stated understanding and said he would.

## 2013-09-10 ENCOUNTER — Ambulatory Visit (INDEPENDENT_AMBULATORY_CARE_PROVIDER_SITE_OTHER): Payer: No Typology Code available for payment source | Admitting: Family Medicine

## 2013-09-10 ENCOUNTER — Encounter: Payer: Self-pay | Admitting: Family Medicine

## 2013-09-10 VITALS — BP 130/92 | HR 80 | Temp 98.4°F | Wt 134.0 lb

## 2013-09-10 DIAGNOSIS — IMO0002 Reserved for concepts with insufficient information to code with codable children: Secondary | ICD-10-CM

## 2013-09-10 DIAGNOSIS — I10 Essential (primary) hypertension: Secondary | ICD-10-CM

## 2013-09-10 DIAGNOSIS — R82998 Other abnormal findings in urine: Secondary | ICD-10-CM

## 2013-09-10 DIAGNOSIS — IMO0001 Reserved for inherently not codable concepts without codable children: Secondary | ICD-10-CM

## 2013-09-10 DIAGNOSIS — E785 Hyperlipidemia, unspecified: Secondary | ICD-10-CM

## 2013-09-10 DIAGNOSIS — Z23 Encounter for immunization: Secondary | ICD-10-CM

## 2013-09-10 DIAGNOSIS — R8290 Unspecified abnormal findings in urine: Secondary | ICD-10-CM

## 2013-09-10 DIAGNOSIS — E1165 Type 2 diabetes mellitus with hyperglycemia: Secondary | ICD-10-CM

## 2013-09-10 LAB — BASIC METABOLIC PANEL
BUN: 12 mg/dL (ref 6–23)
CALCIUM: 9.7 mg/dL (ref 8.4–10.5)
CO2: 28 mEq/L (ref 19–32)
Chloride: 96 mEq/L (ref 96–112)
Creatinine, Ser: 0.8 mg/dL (ref 0.4–1.2)
GFR: 78.4 mL/min (ref >60.00)
GLUCOSE: 115 mg/dL — AB (ref 70–99)
POTASSIUM: 3.5 meq/L (ref 3.5–5.1)
Sodium: 134 mEq/L — ABNORMAL LOW (ref 135–145)

## 2013-09-10 LAB — MICROALBUMIN / CREATININE URINE RATIO
Creatinine,U: 18.3 mg/dL
MICROALB UR: 0.2 mg/dL (ref 0.0–1.9)
MICROALB/CREAT RATIO: 1.1 mg/g (ref 0.0–30.0)

## 2013-09-10 LAB — POCT URINALYSIS DIPSTICK
BILIRUBIN UA: NEGATIVE
Blood, UA: NEGATIVE
GLUCOSE UA: NEGATIVE
Ketones, UA: NEGATIVE
Nitrite, UA: NEGATIVE
Protein, UA: NEGATIVE
Spec Grav, UA: <=1.005
Urobilinogen, UA: NEGATIVE
pH, UA: 7

## 2013-09-10 LAB — HEMOGLOBIN A1C: Hgb A1c MFr Bld: 7 % — ABNORMAL HIGH (ref 4.6–6.5)

## 2013-09-10 LAB — HEPATIC FUNCTION PANEL
ALK PHOS: 37 U/L — AB (ref 39–117)
ALT: 44 U/L — ABNORMAL HIGH (ref 0–35)
AST: 36 U/L (ref 0–37)
Albumin: 4.1 g/dL (ref 3.5–5.2)
Bilirubin, Direct: 0 mg/dL (ref 0.0–0.3)
Total Bilirubin: 0.7 mg/dL (ref 0.2–1.2)
Total Protein: 7.6 g/dL (ref 6.0–8.3)

## 2013-09-10 LAB — LIPID PANEL
Cholesterol: 149 mg/dL (ref 0–200)
HDL: 40.6 mg/dL (ref >39.00)
LDL Cholesterol: 77 mg/dL (ref 0–99)
NONHDL: 108.4
Total CHOL/HDL Ratio: 4
Triglycerides: 155 mg/dL — ABNORMAL HIGH (ref 0.0–149.0)
VLDL: 31 mg/dL (ref 0.0–40.0)

## 2013-09-10 MED ORDER — LOSARTAN POTASSIUM 50 MG PO TABS: 50.0000 mg | ORAL_TABLET | Freq: Every day | ORAL | Status: DC

## 2013-09-10 MED ORDER — METOPROLOL TARTRATE 25 MG PO TABS: 25.0000 mg | ORAL_TABLET | Freq: Two times a day (BID) | ORAL | Status: DC

## 2013-09-10 MED ORDER — GLUCOSE BLOOD VI STRP: ORAL_STRIP | Status: DC

## 2013-09-10 MED ORDER — RAMIPRIL 10 MG PO CAPS: 10.0000 mg | ORAL_CAPSULE | Freq: Every day | ORAL | Status: DC

## 2013-09-10 MED ORDER — FREESTYLE LANCETS MISC: Status: DC

## 2013-09-10 MED ORDER — METFORMIN HCL ER 500 MG PO TB24: 500.0000 mg | ORAL_TABLET | Freq: Every evening | ORAL | Status: DC

## 2013-09-10 MED ORDER — ATORVASTATIN CALCIUM 20 MG PO TABS: 20.0000 mg | ORAL_TABLET | Freq: Every day | ORAL | Status: DC

## 2013-09-10 MED ORDER — CHLORTHALIDONE 25 MG PO TABS: 25.0000 mg | ORAL_TABLET | Freq: Every day | ORAL | Status: DC

## 2013-09-10 NOTE — Patient Instructions (Addendum)
Stop Altace and start cozaar.  Diabetes and Standards of Medical Care Diabetes is complicated. You may find that your diabetes team includes a dietitian, nurse, diabetes educator, eye doctor, and more. To help everyone know what is going on and to help you get the care you deserve, the following schedule of care was developed to help keep you on track. Below are the tests, exams, vaccines, medicines, education, and plans you will need. HbA1c test This test shows how well you have controlled your glucose over the past 2-3 months. It is used to see if your diabetes management plan needs to be adjusted.   It is performed at least 2 times a year if you are meeting treatment goals.  It is performed 4 times a year if therapy has changed or if you are not meeting treatment goals. Blood pressure test  This test is performed at every routine medical visit. The goal is less than 140/90 mm Hg for most people, but 130/80 mm Hg in some cases. Ask your health care provider about your goal. Dental exam  Follow up with the dentist regularly. Eye exam  If you are diagnosed with type 1 diabetes as a child, get an exam upon reaching the age of 62 years or older and have had diabetes for 3-5 years. Yearly eye exams are recommended after that initial eye exam.  If you are diagnosed with type 1 diabetes as an adult, get an exam within 5 years of diagnosis and then yearly.  If you are diagnosed with type 2 diabetes, get an exam as soon as possible after the diagnosis and then yearly. Foot care exam  Visual foot exams are performed at every routine medical visit. The exams check for cuts, injuries, or other problems with the feet.  A comprehensive foot exam should be done yearly. This includes visual inspection as well as assessing foot pulses and testing for loss of sensation.  Check your feet nightly for cuts, injuries, or other problems with your feet. Tell your health care provider if anything is not  healing. Kidney function test (urine microalbumin)  This test is performed once a year.  Type 1 diabetes: The first test is performed 5 years after diagnosis.  Type 2 diabetes: The first test is performed at the time of diagnosis.  A serum creatinine and estimated glomerular filtration rate (eGFR) test is done once a year to assess the level of chronic kidney disease (CKD), if present. Lipid profile (cholesterol, HDL, LDL, triglycerides)  Performed every 5 years for most people.  The goal for LDL is less than 100 mg/dL. If you are at high risk, the goal is less than 70 mg/dL.  The goal for HDL is 40 mg/dL-50 mg/dL for men and 50 mg/dL-60 mg/dL for women. An HDL cholesterol of 60 mg/dL or higher gives some protection against heart disease.  The goal for triglycerides is less than 150 mg/dL. Influenza vaccine, pneumococcal vaccine, and hepatitis B vaccine  The influenza vaccine is recommended yearly.  It is recommended that people with diabetes who are over 62 years old get the pneumonia vaccine. In some cases, two separate shots may be given. Ask your health care provider if your pneumonia vaccination is up to date.  The hepatitis B vaccine is also recommended for adults with diabetes. Diabetes self-management education  Education is recommended at diagnosis and ongoing as needed. Treatment plan  Your treatment plan is reviewed at every medical visit. Document Released: 10/30/2008 Document Revised: 05/19/2013 Document  Reviewed: 06/04/2012 [employer] Patient Information 2015 [place], [place]. This information is not intended to replace advice given to you by your health care provider. Make sure you discuss any questions you have with your health care provider.

## 2013-09-10 NOTE — Progress Notes (Signed)
Pre visit review using our clinic review tool, if applicable. No additional management support is needed unless otherwise documented below in the visit note. 

## 2013-09-10 NOTE — Progress Notes (Signed)
Subjective:    Patient ID: Dominique Hayes, female    DOB: 07/23/1950, 63 y.o.   MRN: [id number]  HPI  HPI HYPERTENSION  Blood pressure range-not checking  Chest pain- no      Dyspnea- no Lightheadedness- no   Edema- no Other side effects - no   Medication compliance: good Low salt diet- yes  DIABETES  Blood Sugar ranges-not checking  Polyuria- no New Visual problems- no Hypoglycemic symptoms- no Other side effects-no Medication compliance - good Last eye exam- due Foot exam- today  HYPERLIPIDEMIA  Medication compliance- good RUQ pain- no  Muscle aches- no Other side effects-no      Review of Systems As above    Past Medical History  Diagnosis Date  . Hypertension   . Hyperlipidemia    History   Social History  . Marital Status: Married    Spouse Name: N/A    Number of Children: N/A  . Years of Education: N/A   Occupational History  . Not on file.   Social History Main Topics  . Smoking status: Never Smoker   . Smokeless tobacco: Not on file  . Alcohol Use: No  . Drug Use: No  . Sexual Activity: Yes   Other Topics Concern  . Not on file   Social History Narrative   Marital status: married      Employment: employed; p/t [employer]   Exercise--- treadmill everday for 30 min   Family History  Problem Relation Age of Onset  . Hypertension Mother   . Hypertension Father   . Hypertension Sister   . Hypertension Sister    Current Outpatient Prescriptions  Medication Sig Dispense Refill  . atorvastatin (LIPITOR) 20 MG tablet Take 1 tablet (20 mg total) by mouth daily.  90 tablet  1  . chlorthalidone (HYGROTON) 25 MG tablet Take 1 tablet (25 mg total) by mouth daily.  90 tablet  3  . [facility] ASPIRIN LOW DOSE 81 MG EC tablet TAKE 1 TABLET BY MOUTH DAILY.  30 tablet  3  . glucose blood (FREESTYLE LITE) test strip Check blood sugar once daily Dx:250.00 (Freestyle Lite Meter)  100 each  12  . Lancets (FREESTYLE) lancets Check blood sugar daily Dx: 250.00  (Freestyle Lite Meter)  100 each  12  . meloxicam (MOBIC) 15 MG tablet Take 1 tablet (15 mg total) by mouth daily. For hand pain; Vietnemese label if available  30 tablet  2  . metFORMIN (GLUCOPHAGE XR) 500 MG 24 hr tablet Take 1 tablet (500 mg total) by mouth every evening.  90 tablet  1  . metoprolol tartrate (LOPRESSOR) 25 MG tablet Take 1 tablet (25 mg total) by mouth 2 (two) times daily.  180 tablet  1  . mometasone (ELOCON) 0.1 % cream Apply 1 application topically daily.  135 g  1  . nitroGLYCERIN (NITROSTAT) 0.4 MG SL tablet Place 1 tablet (0.4 mg total) under the tongue every 5 (five) minutes as needed for chest pain.  30 tablet  0  . nystatin cream (MYCOSTATIN) Apply 1 application topically 2 (two) times daily.  30 g  0  . Varicella-Zoster Immune Glob 125 UNITS SOLR Inject 1 application as directed once.  1 each  0  . losartan (COZAAR) 50 MG tablet Take 1 tablet (50 mg total) by mouth daily.  30 tablet  2   No current facility-administered medications for this visit.    Objective:   Physical Exam  BP 130/92  Pulse 80  Temp(Src) 98.4 F (36.9 C) (Oral)  Wt 134 lb (60.782 kg)  SpO2 99% General appearance: alert, cooperative, appears stated age and no distress Lungs: clear to auscultation bilaterally Heart: S1, S2 normal and + murmur Extremities: extremities normal, atraumatic, no cyanosis or edema Sensory exam of the foot is normal, tested with the monofilament. Good pulses, no lesions or ulcers, good peripheral pulses.       Assessment & Plan:  1. Essential hypertension D/c altace and start cozaar - metoprolol tartrate (LOPRESSOR) 25 MG tablet; Take 1 tablet (25 mg total) by mouth 2 (two) times daily.  Dispense: 180 tablet; Refill: 1 - chlorthalidone (HYGROTON) 25 MG tablet; Take 1 tablet (25 mg total) by mouth daily.  Dispense: 90 tablet; Refill: 3 - losartan (COZAAR) 50 MG tablet; Take 1 tablet (50 mg total) by mouth daily.  Dispense: 30 tablet; Refill: 2 - Basic  metabolic panel - POCT urinalysis dipstick - Microalbumin / creatinine urine ratio  2. Other and unspecified hyperlipidemia Check labs - atorvastatin (LIPITOR) 20 MG tablet; Take 1 tablet (20 mg total) by mouth daily.  Dispense: 90 tablet; Refill: 1 - Hepatic function panel - Lipid panel - POCT urinalysis dipstick - Microalbumin / creatinine urine ratio  3. Diabetes mellitus type II, uncontrolled Pt given freestyle meter-- that is the one covered by her ins - metFORMIN (GLUCOPHAGE XR) 500 MG 24 hr tablet; Take 1 tablet (500 mg total) by mouth every evening.  Dispense: 90 tablet; Refill: 1 - Hemoglobin A1c - POCT urinalysis dipstick - Microalbumin / creatinine urine ratio  4. Need for immunization against influenza   - Flu Vaccine QUAD 36+ mos IM  5. Abnormal urine   - Urine Culture

## 2013-09-11 ENCOUNTER — Other Ambulatory Visit: Payer: Self-pay

## 2013-09-12 LAB — URINE CULTURE
Colony Count: NO GROWTH
Organism ID, Bacteria: NO GROWTH

## 2013-09-17 DIAGNOSIS — E1165 Type 2 diabetes mellitus with hyperglycemia: Secondary | ICD-10-CM

## 2013-09-17 DIAGNOSIS — IMO0002 Reserved for concepts with insufficient information to code with codable children: Secondary | ICD-10-CM

## 2013-09-17 MED ORDER — METFORMIN HCL ER (MOD) 1000 MG PO TB24: 1000.0000 mg | ORAL_TABLET | Freq: Every evening | ORAL | Status: DC

## 2013-09-29 ENCOUNTER — Telehealth: Payer: Self-pay

## 2013-09-29 NOTE — Telephone Encounter (Signed)
Was able to reach pt with numbers provided.

## 2013-12-08 ENCOUNTER — Ambulatory Visit (INDEPENDENT_AMBULATORY_CARE_PROVIDER_SITE_OTHER): Payer: No Typology Code available for payment source | Admitting: Family Medicine

## 2013-12-08 ENCOUNTER — Encounter: Payer: Self-pay | Admitting: Family Medicine

## 2013-12-08 VITALS — HR 62 | Temp 98.3°F | Wt 139.4 lb

## 2013-12-08 DIAGNOSIS — E1165 Type 2 diabetes mellitus with hyperglycemia: Secondary | ICD-10-CM

## 2013-12-08 DIAGNOSIS — Z23 Encounter for immunization: Secondary | ICD-10-CM

## 2013-12-08 DIAGNOSIS — I10 Essential (primary) hypertension: Secondary | ICD-10-CM

## 2013-12-08 DIAGNOSIS — E785 Hyperlipidemia, unspecified: Secondary | ICD-10-CM

## 2013-12-08 DIAGNOSIS — IMO0002 Reserved for concepts with insufficient information to code with codable children: Secondary | ICD-10-CM

## 2013-12-08 DIAGNOSIS — Z1239 Encounter for other screening for malignant neoplasm of breast: Secondary | ICD-10-CM

## 2013-12-08 DIAGNOSIS — E2839 Other primary ovarian failure: Secondary | ICD-10-CM

## 2013-12-08 DIAGNOSIS — M150 Primary generalized (osteo)arthritis: Secondary | ICD-10-CM

## 2013-12-08 DIAGNOSIS — M8949 Other hypertrophic osteoarthropathy, multiple sites: Secondary | ICD-10-CM

## 2013-12-08 DIAGNOSIS — M159 Polyosteoarthritis, unspecified: Secondary | ICD-10-CM

## 2013-12-08 DIAGNOSIS — E119 Type 2 diabetes mellitus without complications: Secondary | ICD-10-CM

## 2013-12-08 DIAGNOSIS — R8290 Unspecified abnormal findings in urine: Secondary | ICD-10-CM

## 2013-12-08 LAB — LIPID PANEL
Cholesterol: 154 mg/dL (ref 0–200)
HDL: 32.2 mg/dL — AB (ref >39.00)
NonHDL: 121.8
Total CHOL/HDL Ratio: 5
Triglycerides: 221 mg/dL — ABNORMAL HIGH (ref 0.0–149.0)
VLDL: 44.2 mg/dL — ABNORMAL HIGH (ref 0.0–40.0)

## 2013-12-08 LAB — HEPATIC FUNCTION PANEL
ALBUMIN: 4.3 g/dL (ref 3.5–5.2)
ALT: 41 U/L — AB (ref 0–35)
AST: 38 U/L — ABNORMAL HIGH (ref 0–37)
Alkaline Phosphatase: 34 U/L — ABNORMAL LOW (ref 39–117)
Bilirubin, Direct: 0.1 mg/dL (ref 0.0–0.3)
Total Bilirubin: 0.8 mg/dL (ref 0.2–1.2)
Total Protein: 7.5 g/dL (ref 6.0–8.3)

## 2013-12-08 LAB — POCT URINALYSIS DIPSTICK
BILIRUBIN UA: NEGATIVE
Glucose, UA: NEGATIVE
Ketones, UA: NEGATIVE
Nitrite, UA: NEGATIVE
PROTEIN UA: NEGATIVE
RBC UA: NEGATIVE
Spec Grav, UA: 1.01
Urobilinogen, UA: 0.2
pH, UA: 7.5

## 2013-12-08 LAB — HEMOGLOBIN A1C: HEMOGLOBIN A1C: 7.2 % — AB (ref 4.6–6.5)

## 2013-12-08 LAB — MICROALBUMIN / CREATININE URINE RATIO
CREATININE, U: 50.2 mg/dL
Microalb Creat Ratio: 2.6 mg/g (ref 0.0–30.0)
Microalb, Ur: 1.3 mg/dL (ref 0.0–1.9)

## 2013-12-08 LAB — BASIC METABOLIC PANEL
BUN: 13 mg/dL (ref 6–23)
CALCIUM: 9.8 mg/dL (ref 8.4–10.5)
CHLORIDE: 100 meq/L (ref 96–112)
CO2: 30 meq/L (ref 19–32)
CREATININE: 0.8 mg/dL (ref 0.4–1.2)
GFR: 83.17 mL/min (ref >60.00)
Glucose, Bld: 132 mg/dL — ABNORMAL HIGH (ref 70–99)
Potassium: 4.7 mEq/L (ref 3.5–5.1)
Sodium: 138 mEq/L (ref 135–145)

## 2013-12-08 LAB — LDL CHOLESTEROL, DIRECT: Direct LDL: 89.7 mg/dL

## 2013-12-08 MED ORDER — LOSARTAN POTASSIUM 50 MG PO TABS: 50.0000 mg | ORAL_TABLET | Freq: Every day | ORAL | Status: DC

## 2013-12-08 MED ORDER — MELOXICAM 15 MG PO TABS: 15.0000 mg | ORAL_TABLET | Freq: Every day | ORAL | Status: DC

## 2013-12-08 MED ORDER — ATORVASTATIN CALCIUM 20 MG PO TABS: 20.0000 mg | ORAL_TABLET | Freq: Every day | ORAL | Status: DC

## 2013-12-08 MED ORDER — GLUCOSE BLOOD VI STRP: ORAL_STRIP | Status: DC

## 2013-12-08 MED ORDER — FREESTYLE LANCETS MISC: Status: DC

## 2013-12-08 MED ORDER — METFORMIN HCL ER (MOD) 1000 MG PO TB24: 1000.0000 mg | ORAL_TABLET | Freq: Every evening | ORAL | Status: DC

## 2013-12-08 MED ORDER — METOPROLOL TARTRATE 25 MG PO TABS: 25.0000 mg | ORAL_TABLET | Freq: Two times a day (BID) | ORAL | Status: DC

## 2013-12-08 MED ORDER — CHLORTHALIDONE 25 MG PO TABS: 25.0000 mg | ORAL_TABLET | Freq: Every day | ORAL | Status: DC

## 2013-12-08 NOTE — Progress Notes (Signed)
Pre visit review using our clinic review tool, if applicable. No additional management support is needed unless otherwise documented below in the visit note. 

## 2013-12-08 NOTE — Patient Instructions (Signed)
Diabetes and Standards of Medical Care Diabetes is complicated. You may find that your diabetes team includes a dietitian, nurse, diabetes educator, eye doctor, and more. To help everyone know what is going on and to help you get the care you deserve, the following schedule of care was developed to help keep you on track. Below are the tests, exams, vaccines, medicines, education, and plans you will need. HbA1c test This test shows how well you have controlled your glucose over the past 2-3 months. It is used to see if your diabetes management plan needs to be adjusted.   It is performed at least 2 times a year if you are meeting treatment goals.  It is performed 4 times a year if therapy has changed or if you are not meeting treatment goals. Blood pressure test  This test is performed at every routine medical visit. The goal is less than 140/90 mm Hg for most people, but 130/80 mm Hg in some cases. Ask your health care provider about your goal. Dental exam  Follow up with the dentist regularly. Eye exam  If you are diagnosed with type 1 diabetes as a child, get an exam upon reaching the age of 48 years or older and have had diabetes for 3-5 years. Yearly eye exams are recommended after that initial eye exam.  If you are diagnosed with type 1 diabetes as an adult, get an exam within 5 years of diagnosis and then yearly.  If you are diagnosed with type 2 diabetes, get an exam as soon as possible after the diagnosis and then yearly. Foot care exam  Visual foot exams are performed at every routine medical visit. The exams check for cuts, injuries, or other problems with the feet.  A comprehensive foot exam should be done yearly. This includes visual inspection as well as assessing foot pulses and testing for loss of sensation.  Check your feet nightly for cuts, injuries, or other problems with your feet. Tell your health care provider if anything is not healing. Kidney function test (urine  microalbumin)  This test is performed once a year.  Type 1 diabetes: The first test is performed 5 years after diagnosis.  Type 2 diabetes: The first test is performed at the time of diagnosis.  A serum creatinine and estimated glomerular filtration rate (eGFR) test is done once a year to assess the level of chronic kidney disease (CKD), if present. Lipid profile (cholesterol, HDL, LDL, triglycerides)  Performed every 5 years for most people.  The goal for LDL is less than 100 mg/dL. If you are at high risk, the goal is less than 70 mg/dL.  The goal for HDL is 40 mg/dL-50 mg/dL for men and 50 mg/dL-60 mg/dL for women. An HDL cholesterol of 60 mg/dL or higher gives some protection against heart disease.  The goal for triglycerides is less than 150 mg/dL. Influenza vaccine, pneumococcal vaccine, and hepatitis B vaccine  The influenza vaccine is recommended yearly.  It is recommended that people with diabetes who are over 84 years old get the pneumonia vaccine. In some cases, two separate shots may be given. Ask your health care provider if your pneumonia vaccination is up to date.  The hepatitis B vaccine is also recommended for adults with diabetes. Diabetes self-management education  Education is recommended at diagnosis and ongoing as needed. Treatment plan  Your treatment plan is reviewed at every medical visit. Document Released: 10/30/2008 Document Revised: 05/19/2013 Document Reviewed: 06/04/2012 [facility] Patient Information 2015 [place],  [employer]. This information is not intended to replace advice given to you by your health care provider. Make sure you discuss any questions you have with your health care provider.  

## 2013-12-08 NOTE — Progress Notes (Signed)
   Subjective:    Patient ID: Dominique Hayes, female    DOB: [birth year], 63 y.o.   MRN: [id number]  HPI  HPI HYPERTENSION  Blood pressure range-not checking  Chest pain- no      Dyspnea- no Lightheadedness- no   Edema- no Other side effects - no   Medication compliance: good Low salt diet- yes  DIABETES  Blood Sugar ranges-73-140  Polyuria- no New Visual problems- no Hypoglycemic symptoms- no Other side effects-no Medication compliance - good Last eye exam- due Foot exam- today  HYPERLIPIDEMIA  Medication compliance- good RUQ pain- no  Muscle aches- no Other side effects-no   Review of Systems As above     Objective:   Physical Exam Pulse 62  Temp(Src) 98.3 F (36.8 C) (Oral)  Wt 139 lb 6.4 oz (63.231 kg)  SpO2 98% General appearance: alert, cooperative, appears stated age and no distress Throat: lips, mucosa, and tongue normal; teeth and gums normal Neck: no adenopathy, supple, symmetrical, trachea midline and thyroid not enlarged, symmetric, no tenderness/mass/nodules Lungs: clear to auscultation bilaterally Heart: S1, S2 normal Extremities: extremities normal, atraumatic, no cyanosis or edema  Sensory exam of the foot is normal, tested with the monofilament. Good pulses, no lesions or ulcers, good peripheral pulses.      Assessment & Plan:  1. Diabetes mellitus type II, controlled Check labs, con't meds - Ambulatory referral to Ophthalmology - Lancets (FREESTYLE) lancets; Check blood sugar daily Dx: 250.00 (Freestyle Lite Meter)  Dispense: 100 each; Refill: 12 - glucose blood (FREESTYLE LITE) test strip; Check blood sugar once daily Dx:250.00 (Freestyle Lite Meter)  Dispense: 100 each; Refill: 12 - Basic metabolic panel - Hepatic function panel  2. Essential hypertension Stable, con't meds - metoprolol tartrate (LOPRESSOR) 25 MG tablet; Take 1 tablet (25 mg total) by mouth 2 (two) times daily.  Dispense: 180 tablet; Refill: 1 - chlorthalidone (HYGROTON)  25 MG tablet; Take 1 tablet (25 mg total) by mouth daily.  Dispense: 90 tablet; Refill: 3 - losartan (COZAAR) 50 MG tablet; Take 1 tablet (50 mg total) by mouth daily.  Dispense: 90 tablet; Refill - Hemoglobin A1c - Microalbumin / creatinine urine ratio - POCT urinalysis dipstick  4. Hyperlipidemia Check labs - atorvastatin (LIPITOR) 20 MG tablet; Take 1 tablet (20 mg total) by mouth daily.  Dispense: 90 tablet; Refill: 1 - Lipid panel  5. Primary osteoarthritis involving multiple joints Refill meds - meloxicam (MOBIC) 15 MG tablet; Take 1 tablet (15 mg total) by mouth daily. For hand pain; Vietnemese label if available  Dispense: 30 tablet; Refill: 5  6. Estrogen deficiency \ - DG Bone Density; Future  7. Breast cancer screening \ - MM DIGITAL SCREENING BILATERAL; Future  8. Need for pneumococcal vaccination \ - Pneumococcal polysaccharide vaccine 23-valent greater than or equal to 2yo subcutaneous/IM  9. Urine abnormality \ - Urine Culture

## 2013-12-09 LAB — URINE CULTURE: Colony Count: 8000

## 2013-12-22 ENCOUNTER — Telehealth: Payer: Self-pay | Admitting: Family Medicine

## 2013-12-22 MED ORDER — SITAGLIPTIN PHOS-METFORMIN HCL 50-1000 MG PO TABS: 1.0000 | ORAL_TABLET | Freq: Two times a day (BID) | ORAL | Status: DC

## 2013-12-22 MED ORDER — FENOFIBRATE 160 MG PO TABS: 160.0000 mg | ORAL_TABLET | Freq: Every day | ORAL | Status: DC

## 2013-12-22 NOTE — Telephone Encounter (Signed)
Pt in need of prio-auth for sitaGLIPtin-metformin (JANUMET) 50-1000 MG per tablet

## 2013-12-23 NOTE — Telephone Encounter (Signed)
PA initiated. Awaiting determination. JG//CMA 

## 2014-01-05 ENCOUNTER — Other Ambulatory Visit: Payer: Self-pay | Admitting: Family Medicine

## 2014-01-13 ENCOUNTER — Ambulatory Visit: Payer: No Typology Code available for payment source | Admitting: Family Medicine

## 2014-03-13 ENCOUNTER — Telehealth: Payer: Self-pay | Admitting: Family Medicine

## 2014-03-13 ENCOUNTER — Ambulatory Visit (INDEPENDENT_AMBULATORY_CARE_PROVIDER_SITE_OTHER): Payer: 59 | Admitting: Family Medicine

## 2014-03-13 ENCOUNTER — Encounter: Payer: Self-pay | Admitting: Family Medicine

## 2014-03-13 VITALS — BP 166/82 | HR 68 | Temp 98.2°F | Wt 132.6 lb

## 2014-03-13 DIAGNOSIS — N390 Urinary tract infection, site not specified: Secondary | ICD-10-CM

## 2014-03-13 DIAGNOSIS — R82998 Other abnormal findings in urine: Secondary | ICD-10-CM

## 2014-03-13 DIAGNOSIS — E119 Type 2 diabetes mellitus without complications: Secondary | ICD-10-CM

## 2014-03-13 DIAGNOSIS — R748 Abnormal levels of other serum enzymes: Secondary | ICD-10-CM

## 2014-03-13 DIAGNOSIS — E785 Hyperlipidemia, unspecified: Secondary | ICD-10-CM

## 2014-03-13 DIAGNOSIS — I10 Essential (primary) hypertension: Secondary | ICD-10-CM

## 2014-03-13 LAB — LIPID PANEL
CHOLESTEROL: 133 mg/dL (ref 0–200)
HDL: 31.9 mg/dL — ABNORMAL LOW (ref >39.00)
NONHDL: 101.1
Total CHOL/HDL Ratio: 4
Triglycerides: 213 mg/dL — ABNORMAL HIGH (ref 0.0–149.0)
VLDL: 42.6 mg/dL — ABNORMAL HIGH (ref 0.0–40.0)

## 2014-03-13 LAB — POCT URINALYSIS DIPSTICK
Bilirubin, UA: NEGATIVE
Glucose, UA: NEGATIVE
Ketones, UA: NEGATIVE
NITRITE UA: NEGATIVE
PH UA: 7
PROTEIN UA: NEGATIVE
RBC UA: NEGATIVE
Spec Grav, UA: 1.015
Urobilinogen, UA: 2

## 2014-03-13 LAB — MICROALBUMIN / CREATININE URINE RATIO
CREATININE, U: 135.5 mg/dL
Microalb Creat Ratio: 0.8 mg/g (ref 0.0–30.0)
Microalb, Ur: 1.1 mg/dL (ref 0.0–1.9)

## 2014-03-13 LAB — BASIC METABOLIC PANEL
BUN: 14 mg/dL (ref 6–23)
CHLORIDE: 104 meq/L (ref 96–112)
CO2: 30 mEq/L (ref 19–32)
Calcium: 10.1 mg/dL (ref 8.4–10.5)
Creatinine, Ser: 1 mg/dL (ref 0.40–1.20)
GFR: 59.63 mL/min — ABNORMAL LOW (ref >60.00)
GLUCOSE: 109 mg/dL — AB (ref 70–99)
Potassium: 4.4 mEq/L (ref 3.5–5.1)
Sodium: 140 mEq/L (ref 135–145)

## 2014-03-13 LAB — HEPATIC FUNCTION PANEL
ALBUMIN: 4.3 g/dL (ref 3.5–5.2)
ALT: 141 U/L — AB (ref 0–35)
AST: 109 U/L — AB (ref 0–37)
Alkaline Phosphatase: 42 U/L (ref 39–117)
Bilirubin, Direct: 0.2 mg/dL (ref 0.0–0.3)
Total Bilirubin: 0.8 mg/dL (ref 0.2–1.2)
Total Protein: 8.2 g/dL (ref 6.0–8.3)

## 2014-03-13 LAB — HEMOGLOBIN A1C: Hgb A1c MFr Bld: 7.3 % — ABNORMAL HIGH (ref 4.6–6.5)

## 2014-03-13 LAB — LDL CHOLESTEROL, DIRECT: LDL DIRECT: 76 mg/dL

## 2014-03-13 MED ORDER — METOPROLOL TARTRATE 25 MG PO TABS
25.0000 mg | ORAL_TABLET | Freq: Two times a day (BID) | ORAL | Status: DC
Start: 2014-03-13 — End: 2014-10-16

## 2014-03-13 MED ORDER — ATORVASTATIN CALCIUM 20 MG PO TABS: 20.0000 mg | ORAL_TABLET | Freq: Every day | ORAL | Status: DC

## 2014-03-13 MED ORDER — LOSARTAN POTASSIUM 50 MG PO TABS: 50.0000 mg | ORAL_TABLET | Freq: Every day | ORAL | Status: DC

## 2014-03-13 MED ORDER — SITAGLIPTIN PHOS-METFORMIN HCL 50-1000 MG PO TABS: 1.0000 | ORAL_TABLET | Freq: Two times a day (BID) | ORAL | Status: DC

## 2014-03-13 MED ORDER — METOPROLOL TARTRATE 25 MG PO TABS: 25.0000 mg | ORAL_TABLET | Freq: Two times a day (BID) | ORAL | Status: DC

## 2014-03-13 MED ORDER — FENOFIBRATE 160 MG PO TABS: 160.0000 mg | ORAL_TABLET | Freq: Every day | ORAL | Status: DC

## 2014-03-13 MED ORDER — CHLORTHALIDONE 25 MG PO TABS: 25.0000 mg | ORAL_TABLET | Freq: Every day | ORAL | Status: DC

## 2014-03-13 MED ORDER — GLUCOSE BLOOD VI STRP: ORAL_STRIP | Status: DC

## 2014-03-13 NOTE — Addendum Note (Signed)
Addended by: Arnette NorrisPAYNE, Brandis Wixted P on: 03/13/2014 12:50 PM   Modules accepted: Orders

## 2014-03-13 NOTE — Progress Notes (Signed)
Pre visit review using our clinic review tool, if applicable. No additional management support is needed unless otherwise documented below in the visit note. 

## 2014-03-13 NOTE — Telephone Encounter (Signed)
Caller name:Tot Le Relationship to patient:husband Can be reached:[phone]    Reason for call: PT husband calling back regarding wife's appointment today.

## 2014-03-13 NOTE — Patient Instructions (Signed)
Diabetes and Standards of Medical Care Diabetes is complicated. You may find that your diabetes team includes a dietitian, nurse, diabetes educator, eye doctor, and more. To help everyone know what is going on and to help you get the care you deserve, the following schedule of care was developed to help keep you on track. Below are the tests, exams, vaccines, medicines, education, and plans you will need. HbA1c test This test shows how well you have controlled your glucose over the past 2-3 months. It is used to see if your diabetes management plan needs to be adjusted.   It is performed at least 2 times a year if you are meeting treatment goals.  It is performed 4 times a year if therapy has changed or if you are not meeting treatment goals. Blood pressure test  This test is performed at every routine medical visit. The goal is less than 140/90 mm Hg for most people, but 130/80 mm Hg in some cases. Ask your health care provider about your goal. Dental exam  Follow up with the dentist regularly. Eye exam  If you are diagnosed with type 1 diabetes as a child, get an exam upon reaching the age of 37 years or older and have had diabetes for 3-5 years. Yearly eye exams are recommended after that initial eye exam.  If you are diagnosed with type 1 diabetes as an adult, get an exam within 5 years of diagnosis and then yearly.  If you are diagnosed with type 2 diabetes, get an exam as soon as possible after the diagnosis and then yearly. Foot care exam  Visual foot exams are performed at every routine medical visit. The exams check for cuts, injuries, or other problems with the feet.  A comprehensive foot exam should be done yearly. This includes visual inspection as well as assessing foot pulses and testing for loss of sensation.  Check your feet nightly for cuts, injuries, or other problems with your feet. Tell your health care provider if anything is not healing. Kidney function test (urine  microalbumin)  This test is performed once a year.  Type 1 diabetes: The first test is performed 5 years after diagnosis.  Type 2 diabetes: The first test is performed at the time of diagnosis.  A serum creatinine and estimated glomerular filtration rate (eGFR) test is done once a year to assess the level of chronic kidney disease (CKD), if present. Lipid profile (cholesterol, HDL, LDL, triglycerides)  Performed every 5 years for most people.  The goal for LDL is less than 100 mg/dL. If you are at high risk, the goal is less than 70 mg/dL.  The goal for HDL is 40 mg/dL-50 mg/dL for men and 50 mg/dL-60 mg/dL for women. An HDL cholesterol of 60 mg/dL or higher gives some protection against heart disease.  The goal for triglycerides is less than 150 mg/dL. Influenza vaccine, pneumococcal vaccine, and hepatitis B vaccine  The influenza vaccine is recommended yearly.  It is recommended that people with diabetes who are over 24 years old get the pneumonia vaccine. In some cases, two separate shots may be given. Ask your health care provider if your pneumonia vaccination is up to date.  The hepatitis B vaccine is also recommended for adults with diabetes. Diabetes self-management education  Education is recommended at diagnosis and ongoing as needed. Treatment plan  Your treatment plan is reviewed at every medical visit. Document Released: 10/30/2008 Document Revised: 05/19/2013 Document Reviewed: 06/04/2012 [facility] Patient Information 2015 [place],  LL[employer]. This information is not intended to replace advice given to you by your health care provider. Make sure you discuss any questions you have with your health care provider.  

## 2014-03-13 NOTE — Progress Notes (Signed)
Subjective:    Patient ID: Dominique Hayes, female    DOB: March 16, 1950, 64 y.o.   MRN: [id number]  HPI  Patient here for f/u dm, htn, hyperlipidemia.  HPI HYPERTENSION  Blood pressure range-not checking--off meds 2 months because of lack of communication with pharmacy  Chest pain- no      Dyspnea- no Lightheadedness- no   Edema- no Other side effects - no   Medication compliance: off meds Low salt diet- yes  DIABETES  Blood Sugar ranges- See home readings scanned in Polyuria- no New Visual problems- no Hypoglycemic symptoms- no Other side effects-no Medication compliance - off meds Last eye exam- last month Foot exam- today  HYPERLIPIDEMIA  Medication compliance- off meds RUQ pain- no  Muscle aches- no Other side effects-no  History   Social History  . Marital Status: Married    Spouse Name: N/A  . Number of Children: N/A  . Years of Education: N/A   Occupational History  . Not on file.   Social History Main Topics  . Smoking status: Never Smoker   . Smokeless tobacco: Not on file  . Alcohol Use: No  . Drug Use: No  . Sexual Activity: Yes   Other Topics Concern  . Not on file   Social History Narrative   Marital status: married      Employment: employed; p/t Chief Strategy Officer   Exercise--- treadmill everday for 30 min   Current Outpatient Prescriptions  Medication Sig Dispense Refill  . [facility] ASP[facility]IN LOW DOSE 81 MG EC tablet TAKE 1 TABLET BY MOUTH DAILY. 30 tablet 3  . glucose blood (FREESTYLE LITE) test strip Check blood sugar once daily Dx:250.00 (Freestyle Lite Meter) 100 each 12  . Lancets (FREESTYLE) lancets Check blood sugar daily Dx: 250.00 (Freestyle Lite Meter) 100 each 12  . meloxicam (MOBIC) 15 MG tablet Take 1 tablet (15 mg total) by mouth daily. For hand pain; Vietnemese label if available 30 tablet 5  . mometasone (ELOCON) 0.1 % cream Apply 1 application topically daily. 135 g 1  . nitroGLYCERIN (NITROSTAT) 0.4 MG SL tablet Place 1 tablet (0.4 mg  total) under the tongue every 5 (five) minutes as needed for chest pain. 30 tablet 0  . nystatin cream (MYCOSTATIN) Apply 1 application topically 2 (two) times daily. 30 g 0  . atorvastatin (LIPITOR) 20 MG tablet Take 1 tablet (20 mg total) by mouth daily. 30 tablet 5  . chlorthalidone (HYGROTON) 25 MG tablet Take 1 tablet (25 mg total) by mouth daily. 30 tablet 5  . fenofibrate 160 MG tablet Take 1 tablet (160 mg total) by mouth daily. 30 tablet 2  . losartan (COZAAR) 50 MG tablet Take 1 tablet (50 mg total) by mouth daily. 30 tablet 5  . metoprolol tartrate (LOPRESSOR) 25 MG tablet Take 1 tablet (25 mg total) by mouth 2 (two) times daily. 60 tablet 5  . sitaGLIPtin-metformin (JANUMET) 50-1000 MG per tablet Take 1 tablet by mouth 2 (two) times daily with a meal. 60 tablet 5   No current facility-administered medications for this visit.    Past Medical History  Diagnosis Date  . Hypertension   . Hyperlipidemia     Review of Systems  Constitutional: Negative for diaphoresis, appetite change, fatigue and unexpected weight change.  Eyes: Negative for pain, redness and visual disturbance.  Respiratory: Negative for cough, chest tightness, shortness of breath and wheezing.   Cardiovascular: Negative for chest pain, palpitations and leg swelling.  Endocrine: Negative for  cold intolerance, heat intolerance, polydipsia, polyphagia and polyuria.  Genitourinary: Negative for dysuria, frequency and difficulty urinating.  Neurological: Negative for dizziness, light-headedness, numbness and headaches.       Objective:    Physical Exam  Constitutional: She is oriented to person, place, and time. She appears well-developed and well-nourished. No distress.  HENT:  Right Ear: External ear normal.  Left Ear: External ear normal.  Nose: Nose normal.  Mouth/Throat: Oropharynx is clear and moist.  Eyes: EOM are normal. Pupils are equal, round, and reactive to light.  Neck: Normal range of motion.  Neck supple.  Cardiovascular: Normal rate, regular rhythm and normal heart sounds.   No murmur heard. Pulmonary/Chest: Effort normal and breath sounds normal. No respiratory distress. She has no wheezes. She has no rales. She exhibits no tenderness.  Neurological: She is alert and oriented to person, place, and time.  Psychiatric: She has a normal mood and affect. Her behavior is normal. Judgment and thought content normal.  Sensory exam of the foot is normal, tested with the monofilament. Good pulses, no lesions or ulcers, good peripheral pulses.   BP 166/82 mmHg  Pulse 68  Temp(Src) 98.2 F (36.8 C) (Oral)  Wt 132 lb 9.6 oz (60.147 kg)  SpO2 99% Wt Readings from Last 3 Encounters:  03/13/14 132 lb 9.6 oz (60.147 kg)  12/08/13 139 lb 6.4 oz (63.231 kg)  09/10/13 134 lb (60.782 kg)     Lab Results  Component Value Date   WBC 8.0 05/26/2013   HGB 14.3 05/26/2013   HCT 41.9 05/26/2013   PLT 285.0 05/26/2013   GLUCOSE 132* 12/08/2013   CHOL 154 12/08/2013   TRIG 221.0* 12/08/2013   HDL 32.20* 12/08/2013   LDLDIRECT 89.7 12/08/2013   LDLCALC 77 09/10/2013   ALT 41* 12/08/2013   AST 38* 12/08/2013   NA 138 12/08/2013   K 4.7 12/08/2013   CL 100 12/08/2013   CREATININE 0.8 12/08/2013   BUN 13 12/08/2013   CO2 30 12/08/2013   TSH 1.05 05/26/2013   HGBA1C 7.2* 12/08/2013   MICROALBUR 1.3 12/08/2013    No results found.     Assessment & Plan:   Problem List Items Addressed This Visit    None    Visit Diagnoses    Essential hypertension    -  Primary    Relevant Medications    losartan (COZAAR) tablet    chlorthalidone (HYGROTON) tablet    metoprolol tartrate (LOPRESSOR) tablet    fenofibrate tablet    atorvastatin (LIPITOR) tablet    Other Relevant Orders    Basic metabolic panel    POCT urinalysis dipstick    Diabetes mellitus type II, controlled        Relevant Medications    glucose blood (FREESTYLE LITE) test strip    sitaGLIPtin-metformin (JANUMET)  50-1000 MG per tablet    losartan (COZAAR) tablet    atorvastatin (LIPITOR) tablet    Other Relevant Orders    Basic metabolic panel    Hemoglobin A1c    Microalbumin / creatinine urine ratio    POCT urinalysis dipstick    Hyperlipidemia LDL goal <100        Relevant Medications    losartan (COZAAR) tablet    chlorthalidone (HYGROTON) tablet    metoprolol tartrate (LOPRESSOR) tablet    fenofibrate tablet    atorvastatin (LIPITOR) tablet    Other Relevant Orders    Hepatic function panel    Lipid panel    POCT urinalysis  dipstick        Loreen Freud, DO

## 2014-03-14 LAB — URINE CULTURE: Colony Count: >=100000

## 2014-03-17 ENCOUNTER — Telehealth: Payer: Self-pay | Admitting: *Deleted

## 2014-03-17 MED ORDER — SITAGLIPTIN PHOS-METFORMIN HCL 50-1000 MG PO TABS: 1.0000 | ORAL_TABLET | Freq: Two times a day (BID) | ORAL | Status: DC

## 2014-03-17 NOTE — Telephone Encounter (Signed)
Prior authorizations initiated for metformin and janumet. Awaiting determination. JG//CMA

## 2014-03-17 NOTE — Telephone Encounter (Signed)
She does need the janumet ----- con't meds and re will recheck in 3 months meds were all just restarted Liver function elevated ---she has not taken any meds in 2 months---- any abd pain? Or inc amounts of tylenol or other otc? Recheck hep, --- with acute hep and GGT--- elevated liver enzymes-- if remains elevated-- check US abd

## 2014-03-17 NOTE — Telephone Encounter (Signed)
Via [place] interpreter:    I gave her the below information per Dr.Lowne, she has not received the Janumet yet, I re-faxed it to the pharmacy. She stated she was not having pain but she was having the reflux. I scheduled the patient for Friday 03/20/14 at 8:30 for repeat labs.      KP

## 2014-03-20 ENCOUNTER — Telehealth: Payer: Self-pay | Admitting: Family Medicine

## 2014-03-20 ENCOUNTER — Other Ambulatory Visit (INDEPENDENT_AMBULATORY_CARE_PROVIDER_SITE_OTHER): Payer: 59

## 2014-03-20 DIAGNOSIS — R748 Abnormal levels of other serum enzymes: Secondary | ICD-10-CM

## 2014-03-20 LAB — HEPATIC FUNCTION PANEL
ALK PHOS: 41 U/L (ref 39–117)
ALT: 136 U/L — AB (ref 0–35)
AST: 85 U/L — ABNORMAL HIGH (ref 0–37)
Albumin: 4.4 g/dL (ref 3.5–5.2)
BILIRUBIN DIRECT: 0.1 mg/dL (ref 0.0–0.3)
Total Bilirubin: 0.6 mg/dL (ref 0.2–1.2)
Total Protein: 8.3 g/dL (ref 6.0–8.3)

## 2014-03-20 LAB — GAMMA GT: GGT: 32 U/L (ref 7–51)

## 2014-03-20 NOTE — Telephone Encounter (Signed)
I spoke with patient and the Janumet PA has been approved. I gave her a coupon for a discount and [facility].     KP

## 2014-03-20 NOTE — Telephone Encounter (Signed)
Caller name: Javier Dockerga T Relation to pt: Self Call back number: [phone] [facility] [address] [place]  Reason for call: Pt came in office for labs today and pt is requesting that needs medication for diabetes, pt does not remember name of med. Pt states also that Dr. Laury AxonLowne took the med away from her two months ago but pt wants to start med again. Please advise.

## 2014-03-21 LAB — HEPATITIS PANEL, ACUTE
HCV Ab: NEGATIVE
HEP B S AG: POSITIVE — AB
Hep A IgM: NONREACTIVE
Hep B C IgM: REACTIVE — AB

## 2014-03-21 LAB — HEPATITIS B SURF AG CONFIRMATION: Hepatitis B Surf Ag Confirmation: POSITIVE — AB

## 2014-03-24 ENCOUNTER — Other Ambulatory Visit: Payer: Self-pay

## 2014-03-24 ENCOUNTER — Encounter: Payer: Self-pay | Admitting: Internal Medicine

## 2014-03-24 DIAGNOSIS — B1910 Unspecified viral hepatitis B without hepatic coma: Secondary | ICD-10-CM

## 2014-03-30 ENCOUNTER — Encounter: Payer: Self-pay | Admitting: Internal Medicine

## 2014-05-15 ENCOUNTER — Ambulatory Visit: Payer: 59 | Admitting: Internal Medicine

## 2014-05-18 ENCOUNTER — Encounter: Payer: Self-pay | Admitting: Internal Medicine

## 2014-05-18 ENCOUNTER — Ambulatory Visit (INDEPENDENT_AMBULATORY_CARE_PROVIDER_SITE_OTHER): Payer: 59 | Admitting: Internal Medicine

## 2014-05-18 ENCOUNTER — Other Ambulatory Visit: Payer: 59

## 2014-05-18 VITALS — BP 128/78 | HR 60 | Ht <= 58 in | Wt 131.2 lb

## 2014-05-18 DIAGNOSIS — R7989 Other specified abnormal findings of blood chemistry: Secondary | ICD-10-CM | POA: Diagnosis not present

## 2014-05-18 DIAGNOSIS — R945 Abnormal results of liver function studies: Secondary | ICD-10-CM

## 2014-05-18 DIAGNOSIS — B169 Acute hepatitis B without delta-agent and without hepatic coma: Secondary | ICD-10-CM | POA: Diagnosis not present

## 2014-05-18 DIAGNOSIS — B181 Chronic viral hepatitis B without delta-agent: Secondary | ICD-10-CM | POA: Diagnosis not present

## 2014-05-18 DIAGNOSIS — B1910 Unspecified viral hepatitis B without hepatic coma: Secondary | ICD-10-CM

## 2014-05-18 NOTE — Patient Instructions (Addendum)
Your physician has requested that you go to the basement for the following lab work before leaving today:  Hepatitis B  You will be contacted regarding an appointment at the liver clinic.   [facility] of [facility] on 05/26/2014 at 8:00am. Please arrive 15 minutes prior to your appointment for registration. Make certain not to have anything to eat or drink  after midnight prior to your appointment. Should you need to reschedule your appointment, please contact radiology at [phone]. This test typically takes about 30 minutes to perform.

## 2014-05-18 NOTE — Progress Notes (Signed)
HISTORY OF PRESENT ILLNESS:  Dominique Hayes is a 64 y.o. female, non-English-speaking [place]namese with hypertension and diabetes mellitus, who is sent by Dr. Laury AxonLowne regarding abnormal liver tests. A professional interpreter assists. The patient has had abnormal hepatic transaminases for at least 20 months. Testing in 2009 suggested chronic hepatitis B infection with positive hepatitis B surface antigen, negative hepatitis B surface antibody, negative e antigen, and positive e antibody. Immunity to hepatitis A was demonstrated. Hepatitis C negative. Transaminases have been 2-4 times the upper limit of normal. Normal bilirubin, alkaline phosphatase, and albumin. The patient denies jaundice, edema, ascites, bleeding, or change in mental status. She does not use alcohol.  REVIEW OF SYSTEMS:  All non-GI ROS negative upon review  Past Medical History  Diagnosis Date  . Hypertension   . Hyperlipidemia   . DM (diabetes mellitus)   . Hepatitis B   . Mild diastolic dysfunction     Past Surgical History  Procedure Laterality Date  . Abdominal hysterectomy      TAH-- complications from D &C    Social History Dominique Hayes  reports that she has never smoked. She has never used smokeless tobacco. She reports that she does not drink alcohol or use illicit drugs.  family history includes Hypertension in her father, mother, sister, and sister.  No Known Allergies     PHYSICAL EXAMINATION: Vital signs: BP 128/78 mmHg  Pulse 60  Ht 4\' 10"  (1.473 m)  Wt 131 lb 4 oz (59.535 kg)  BMI 27.44 kg/m2  Constitutional: generally well-appearing, no acute distress Psychiatric: alert and oriented x3, cooperative Eyes: extraocular movements intact, anicteric, conjunctiva pink Mouth: oral pharynx moist, no lesions Neck: supple no lymphadenopathy Cardiovascular: heart regular rate and rhythm, no murmur Lungs: clear to auscultation bilaterally Abdomen: soft, nontender, nondistended, no obvious ascites, no peritoneal  signs, normal bowel sounds, no organomegaly Extremities: no lower extremity edema bilaterally Skin: no lesions on visible extremities Neuro: No focal deficits. No asterixis.   ASSESSMENT:  #1. Chronic hepatitis B infection without evidence of acute or chronic liver disease   PLAN:  #1. Obtain hepatitis B quantitative DNA #2. Hepatic ultrasound #3. [facility] for treatment and monitoring of chronic viral hepatitis. #4. Resume general medical care with Dr. Laury AxonLowne  A copy of this consultation note has been sent to Dr. Laury AxonLowne

## 2014-05-19 ENCOUNTER — Telehealth: Payer: Self-pay

## 2014-05-21 LAB — HEPATITIS B DNA, QUALITATIVE: Hepatitis B Virus DNA Qual: DETECTED — AB

## 2014-05-26 ENCOUNTER — Ambulatory Visit (HOSPITAL_COMMUNITY)
Admission: RE | Admit: 2014-05-26 | Discharge: 2014-05-26 | Disposition: A | Discharge status: Home / Self Care | Payer: 59 | Source: Ambulatory Visit | Attending: Internal Medicine | Admitting: Internal Medicine

## 2014-05-26 DIAGNOSIS — B1910 Unspecified viral hepatitis B without hepatic coma: Secondary | ICD-10-CM

## 2014-05-26 DIAGNOSIS — K8020 Calculus of gallbladder without cholecystitis without obstruction: Secondary | ICD-10-CM | POA: Diagnosis not present

## 2014-05-26 NOTE — Telephone Encounter (Signed)
[facility]ax[facility]

## 2014-09-11 ENCOUNTER — Ambulatory Visit: Payer: 59 | Admitting: Family Medicine

## 2014-10-16 ENCOUNTER — Encounter: Payer: Self-pay | Admitting: Family Medicine

## 2014-10-16 ENCOUNTER — Ambulatory Visit (INDEPENDENT_AMBULATORY_CARE_PROVIDER_SITE_OTHER): Payer: 59 | Admitting: Family Medicine

## 2014-10-16 ENCOUNTER — Other Ambulatory Visit: Payer: Self-pay

## 2014-10-16 VITALS — HR 78 | Temp 97.8°F | Ht <= 58 in | Wt 129.4 lb

## 2014-10-16 DIAGNOSIS — E785 Hyperlipidemia, unspecified: Secondary | ICD-10-CM

## 2014-10-16 DIAGNOSIS — M159 Polyosteoarthritis, unspecified: Secondary | ICD-10-CM

## 2014-10-16 DIAGNOSIS — I10 Essential (primary) hypertension: Secondary | ICD-10-CM

## 2014-10-16 DIAGNOSIS — M150 Primary generalized (osteo)arthritis: Secondary | ICD-10-CM

## 2014-10-16 DIAGNOSIS — E119 Type 2 diabetes mellitus without complications: Secondary | ICD-10-CM

## 2014-10-16 DIAGNOSIS — Z23 Encounter for immunization: Secondary | ICD-10-CM

## 2014-10-16 DIAGNOSIS — M8949 Other hypertrophic osteoarthropathy, multiple sites: Secondary | ICD-10-CM

## 2014-10-16 DIAGNOSIS — N760 Acute vaginitis: Secondary | ICD-10-CM

## 2014-10-16 DIAGNOSIS — L309 Dermatitis, unspecified: Secondary | ICD-10-CM

## 2014-10-16 LAB — POCT URINALYSIS DIPSTICK
Bilirubin, UA: NEGATIVE
Blood, UA: NEGATIVE
Glucose, UA: NEGATIVE
KETONES UA: NEGATIVE
Leukocytes, UA: NEGATIVE
Nitrite, UA: NEGATIVE
PROTEIN UA: NEGATIVE
SPEC GRAV UA: 1.015
Urobilinogen, UA: 0.2
pH, UA: 6

## 2014-10-16 LAB — MICROALBUMIN / CREATININE URINE RATIO
CREATININE, U: 85.5 mg/dL
MICROALB/CREAT RATIO: 0.8 mg/g (ref 0.0–30.0)

## 2014-10-16 LAB — LIPID PANEL
Cholesterol: 194 mg/dL (ref 0–200)
HDL: 45.2 mg/dL (ref >39.00)
LDL Cholesterol: 126 mg/dL — ABNORMAL HIGH (ref 0–99)
NONHDL: 148.62
Total CHOL/HDL Ratio: 4
Triglycerides: 111 mg/dL (ref 0.0–149.0)
VLDL: 22.2 mg/dL (ref 0.0–40.0)

## 2014-10-16 LAB — COMPREHENSIVE METABOLIC PANEL
ALK PHOS: 42 U/L (ref 39–117)
ALT: 85 U/L — ABNORMAL HIGH (ref 0–35)
AST: 67 U/L — ABNORMAL HIGH (ref 0–37)
Albumin: 4.2 g/dL (ref 3.5–5.2)
BUN: 13 mg/dL (ref 6–23)
CO2: 30 mEq/L (ref 19–32)
Calcium: 9.7 mg/dL (ref 8.4–10.5)
Chloride: 103 mEq/L (ref 96–112)
Creatinine, Ser: 0.77 mg/dL (ref 0.40–1.20)
GFR: 80.46 mL/min (ref >60.00)
GLUCOSE: 102 mg/dL — AB (ref 70–99)
POTASSIUM: 4.2 meq/L (ref 3.5–5.1)
Sodium: 139 mEq/L (ref 135–145)
TOTAL PROTEIN: 8.7 g/dL — AB (ref 6.0–8.3)
Total Bilirubin: 0.7 mg/dL (ref 0.2–1.2)

## 2014-10-16 LAB — HEMOGLOBIN A1C: Hgb A1c MFr Bld: 6.2 % (ref 4.6–6.5)

## 2014-10-16 MED ORDER — SITAGLIPTIN PHOS-METFORMIN HCL 50-1000 MG PO TABS: 1.0000 | ORAL_TABLET | Freq: Two times a day (BID) | ORAL | Status: DC

## 2014-10-16 MED ORDER — LOSARTAN POTASSIUM 50 MG PO TABS: 50.0000 mg | ORAL_TABLET | Freq: Every day | ORAL | Status: DC

## 2014-10-16 MED ORDER — MELOXICAM 15 MG PO TABS: 15.0000 mg | ORAL_TABLET | Freq: Every day | ORAL | Status: DC

## 2014-10-16 MED ORDER — INFLUENZA VAC SPLIT QUAD 0.5 ML IM SUSY
0.5000 mL | PREFILLED_SYRINGE | Freq: Once | INTRAMUSCULAR | Status: AC
  Administered 2014-10-16: 0.5 mL via INTRAMUSCULAR

## 2014-10-16 MED ORDER — FREESTYLE LANCETS MISC: Status: DC

## 2014-10-16 MED ORDER — FENOFIBRATE 160 MG PO TABS: 160.0000 mg | ORAL_TABLET | Freq: Every day | ORAL | Status: DC

## 2014-10-16 MED ORDER — METOPROLOL TARTRATE 25 MG PO TABS: 25.0000 mg | ORAL_TABLET | Freq: Two times a day (BID) | ORAL | Status: DC

## 2014-10-16 MED ORDER — NITROGLYCERIN 0.4 MG SL SUBL: 0.4000 mg | SUBLINGUAL_TABLET | SUBLINGUAL | Status: DC | PRN

## 2014-10-16 MED ORDER — FENOFIBRATE MICRONIZED 130 MG PO CAPS: 130.0000 mg | ORAL_CAPSULE | Freq: Every day | ORAL | Status: DC

## 2014-10-16 MED ORDER — ATORVASTATIN CALCIUM 20 MG PO TABS: 20.0000 mg | ORAL_TABLET | Freq: Every day | ORAL | Status: DC

## 2014-10-16 MED ORDER — GLUCOSE BLOOD VI STRP: ORAL_STRIP | Status: DC

## 2014-10-16 NOTE — Patient Instructions (Signed)

## 2014-10-16 NOTE — Addendum Note (Signed)
Addended by: Lurline Hare on: 10/16/2014 09:25 AM   Modules accepted: Orders

## 2014-10-16 NOTE — Progress Notes (Signed)
Pre visit review using our clinic review tool, if applicable. No additional management support is needed unless otherwise documented below in the visit note. 

## 2014-10-16 NOTE — Progress Notes (Signed)
Patient ID: AAMORI MCMASTERS, female    DOB: 21-Sep-1950  Age: 64 y.o. MRN: [id number]    Subjective:  Subjective HPI Dominique Hayes presents for f/u dm, bp and cholesterol.    HYPERTENSION  Blood pressure range-not checking   Chest pain- no      Dyspnea- no Lightheadedness- no   Edema- no Other side effects - no   Medication compliance: good  Low salt diet- yes   DIABETES  Blood Sugar ranges-90-low 100s   Polyuria- no New Visual problems- no Hypoglycemic symptoms- no Other side effects-no Medication compliance - good Last eye exam- recent Foot exam- today  HYPERLIPIDEMIA  Medication compliance- no RUQ pain- no  Muscle aches- no Other side effects-no  Review of Systems  Constitutional: Negative for diaphoresis, appetite change, fatigue and unexpected weight change.  Eyes: Negative for pain, redness and visual disturbance.  Respiratory: Negative for cough, chest tightness, shortness of breath and wheezing.   Cardiovascular: Negative for chest pain, palpitations and leg swelling.  Endocrine: Negative for cold intolerance, heat intolerance, polydipsia, polyphagia and polyuria.  Genitourinary: Negative for dysuria, frequency and difficulty urinating.  Neurological: Negative for dizziness, light-headedness, numbness and headaches.  All other systems reviewed and are negative.     History Past Medical History  Diagnosis Date  . Hypertension   . Hyperlipidemia   . DM (diabetes mellitus)   . Hepatitis B   . Mild diastolic dysfunction     She has past surgical history that includes Abdominal hysterectomy.   Her family history includes Hypertension in her father, mother, sister, and sister.She reports that she has never smoked. She has never used smokeless tobacco. She reports that she does not drink alcohol or use illicit drugs.  Current Outpatient Prescriptions on File Prior to Visit  Medication Sig Dispense Refill  . [facility] ASPIRIN LOW DOSE 81 MG EC tablet TAKE 1 TABLET BY  MOUTH DAILY. 30 tablet 3  . mometasone (ELOCON) 0.1 % cream Apply 1 application topically daily. 135 g 1  . nystatin cream (MYCOSTATIN) Apply 1 application topically 2 (two) times daily. 30 g 0  . chlorthalidone (HYGROTON) 25 MG tablet Take 1 tablet (25 mg total) by mouth daily. (Patient not taking: Reported on 10/16/2014) 30 tablet 5   No current facility-administered medications on file prior to visit.     Objective:  Objective Physical Exam  Constitutional: She is oriented to person, place, and time. She appears well-developed and well-nourished.  HENT:  Head: Normocephalic and atraumatic.  Eyes: Conjunctivae and EOM are normal.  Neck: Normal range of motion. Neck supple. No JVD present. Carotid bruit is not present. No thyromegaly present.  Cardiovascular: Normal rate, regular rhythm and normal heart sounds.   No murmur heard. Pulmonary/Chest: Effort normal and breath sounds normal. No respiratory distress. She has no wheezes. She has no rales. She exhibits no tenderness.  Musculoskeletal: She exhibits no edema.  Neurological: She is alert and oriented to person, place, and time.  Psychiatric: She has a normal mood and affect. Her behavior is normal. Judgment and thought content normal.  Nursing note and vitals reviewed. Sensory exam of the foot is normal, tested with the monofilament. Good pulses, no lesions or ulcers, good peripheral pulses.  Pulse 78  Temp(Src) 97.8 F (36.6 C) (Oral)  Ht _0  (1.473 m)  Wt 129 lb 6.4 oz (58.695 kg)  BMI 27.05 kg/m2  SpO2 98% Wt Readings from Last 3 Encounters:  10/16/14 129 lb 6.4 oz (58.695 kg)  05/18/14 131 lb 4 oz (59.535 kg)  03/13/14 132 lb 9.6 oz (60.147 kg)     Lab Results  Component Value Date   WBC 8.0 05/26/2013   HGB 14.3 05/26/2013   HCT 41.9 05/26/2013   PLT 285.0 05/26/2013   GLUCOSE 109* 03/13/2014   CHOL 133 03/13/2014   TRIG 213.0* 03/13/2014   HDL 31.90* 03/13/2014   LDLDIRECT 76.0 03/13/2014   LDLCALC 77  09/10/2013   ALT 136* 03/20/2014   AST 85* 03/20/2014   NA 140 03/13/2014   K 4.4 03/13/2014   CL 104 03/13/2014   CREATININE 1.00 03/13/2014   BUN 14 03/13/2014   CO2 30 03/13/2014   TSH 1.05 05/26/2013   HGBA1C 7.3* 03/13/2014   MICROALBUR 1.1 03/13/2014    US Abdomen Complete  05/26/2014   CLINICAL DATA:  Hepatitis-B  EXAM: ULTRASOUND ABDOMEN COMPLETE  COMPARISON:  None.  FINDINGS: Gallbladder: Multiple layering gallstones are noted within gallbladder the largest measures 6 mm. No thickening of gallbladder wall. No sonographic Murphy's sign.  Common bile duct: Diameter: 5.8 mm in diameter.  Liver: No focal lesion identified. Within normal limits in parenchymal echogenicity. No intrahepatic biliary ductal dilatation.  IVC: No abnormality visualized.  Pancreas: Visualized portion unremarkable.  Spleen: Size and appearance within normal limits. Measures 6.2 cm in length  Right Kidney: Length: 10.2 cm. Echogenicity within normal limits. No mass or hydronephrosis visualized.  Left Kidney: Length: 10.5 cm. Echogenicity within normal limits. No mass or hydronephrosis visualized.  Abdominal aorta: No aneurysm visualized. Measures up to 2.3 cm in diameter.  Other findings: None.  IMPRESSION: 1. Multiple gallstones are noted within gallbladder. 2. No sonographic Murphy's sign.  Normal CBD. 3. No hydronephrosis. 4. No aortic aneurysm. 5. No splenomegaly.   Electronically Signed   By: Lahoma Crocker M.D.   On: 05/26/2014 09:35     Assessment & Plan:  Plan I have discontinued Dominique Hayes's fenofibrate and fenofibrate. I am also having her maintain her [facility] ASPIRIN LOW DOSE, nystatin cream, mometasone, chlorthalidone, glucose blood, freestyle, losartan, nitroGLYCERIN, atorvastatin, fenofibrate micronized, meloxicam, metoprolol tartrate, and sitaGLIPtin-metformin.  Meds ordered this encounter  Medications  . DISCONTD: atorvastatin (LIPITOR) 20 MG tablet    Sig: Take 1 tablet (20 mg total) by mouth daily.     Dispense:  30 tablet    Refill:  5  . DISCONTD: fenofibrate 160 MG tablet    Sig: Take 1 tablet (160 mg total) by mouth daily.    Dispense:  30 tablet    Refill:  2  . glucose blood (FREESTYLE LITE) test strip    Sig: Check blood sugar once daily Dx:250.00 (Freestyle Lite Meter)    Dispense:  100 each    Refill:  12  . Lancets (FREESTYLE) lancets    Sig: Check blood sugar daily Dx: 250.00 (Freestyle Lite Meter)    Dispense:  100 each    Refill:  12  . losartan (COZAAR) 50 MG tablet    Sig: Take 1 tablet (50 mg total) by mouth daily.    Dispense:  30 tablet    Refill:  5  . DISCONTD: meloxicam (MOBIC) 15 MG tablet    Sig: Take 1 tablet (15 mg total) by mouth daily. For hand pain; Vietnemese label if available    Dispense:  30 tablet    Refill:  5  . DISCONTD: metoprolol tartrate (LOPRESSOR) 25 MG tablet    Sig: Take 1 tablet (25 mg total) by mouth 2 (two) times  daily.    Dispense:  60 tablet    Refill:  5  . nitroGLYCERIN (NITROSTAT) 0.4 MG SL tablet    Sig: Place 1 tablet (0.4 mg total) under the tongue every 5 (five) minutes as needed for chest pain.    Dispense:  30 tablet    Refill:  0  . DISCONTD: sitaGLIPtin-metformin (JANUMET) 50-1000 MG tablet    Sig: Take 1 tablet by mouth 2 (two) times daily with a meal.    Dispense:  60 tablet    Refill:  5  . DISCONTD: fenofibrate micronized (ANTARA) 130 MG capsule    Sig: Take 1 capsule (130 mg total) by mouth daily before breakfast.    Dispense:  90 capsule    Refill:  1  . atorvastatin (LIPITOR) 20 MG tablet    Sig: Take 1 tablet (20 mg total) by mouth daily.    Dispense:  90 tablet    Refill:  3  . fenofibrate micronized (ANTARA) 130 MG capsule    Sig: Take 1 capsule (130 mg total) by mouth daily before breakfast.    Dispense:  270 capsule    Refill:  1  . meloxicam (MOBIC) 15 MG tablet    Sig: Take 1 tablet (15 mg total) by mouth daily. For hand pain; Vietnemese label if available    Dispense:  90 tablet    Refill:  1    . metoprolol tartrate (LOPRESSOR) 25 MG tablet    Sig: Take 1 tablet (25 mg total) by mouth 2 (two) times daily.    Dispense:  180 tablet    Refill:  1  . sitaGLIPtin-metformin (JANUMET) 50-1000 MG tablet    Sig: Take 1 tablet by mouth 2 (two) times daily with a meal.    Dispense:  180 tablet    Refill:  1    Problem List Items Addressed This Visit    Essential hypertension    Stable con't metoprolol and losartan      Relevant Medications   losartan (COZAAR) 50 MG tablet   nitroGLYCERIN (NITROSTAT) 0.4 MG SL tablet   atorvastatin (LIPITOR) 20 MG tablet   fenofibrate micronized (ANTARA) 130 MG capsule   metoprolol tartrate (LOPRESSOR) 25 MG tablet   Other Relevant Orders   Hemoglobin A1c   Lipid panel   Microalbumin / creatinine urine ratio   POCT urinalysis dipstick   Comp Met (CMET)    Other Visit Diagnoses    Hyperlipidemia LDL goal <100    -  Primary    Relevant Medications    losartan (COZAAR) 50 MG tablet    nitroGLYCERIN (NITROSTAT) 0.4 MG SL tablet    atorvastatin (LIPITOR) 20 MG tablet    fenofibrate micronized (ANTARA) 130 MG capsule    metoprolol tartrate (LOPRESSOR) 25 MG tablet    Other Relevant Orders    Lipid panel    Comp Met (CMET)    Diabetes mellitus type II, controlled        Relevant Medications    glucose blood (FREESTYLE LITE) test strip    Lancets (FREESTYLE) lancets    losartan (COZAAR) 50 MG tablet    atorvastatin (LIPITOR) 20 MG tablet    sitaGLIPtin-metformin (JANUMET) 50-1000 MG tablet    Other Relevant Orders    Hemoglobin A1c    Microalbumin / creatinine urine ratio    POCT urinalysis dipstick    Comp Met (CMET)    Primary osteoarthritis involving multiple joints        Relevant Medications  meloxicam (MOBIC) 15 MG tablet       Follow-up: Return in about 6 months (around 04/15/2015), or if symptoms worsen or fail to improve, for hypertension, hyperlipidemia, diabetes II, annual exam, fasting.  Garnet Koyanagi, DO

## 2014-10-16 NOTE — Assessment & Plan Note (Addendum)
Stable con't metoprolol and losartan

## 2014-10-16 NOTE — Assessment & Plan Note (Signed)
Pt will find out from ins co what ins will pay for inplace of janumet Check labs

## 2014-10-19 ENCOUNTER — Telehealth: Payer: Self-pay | Admitting: *Deleted

## 2014-10-19 NOTE — Telephone Encounter (Signed)
PA for fenofibrate micronized (ANTARA) 130 MG capsule approved.

## 2014-11-17 ENCOUNTER — Ambulatory Visit (INDEPENDENT_AMBULATORY_CARE_PROVIDER_SITE_OTHER): Payer: 59

## 2014-11-17 DIAGNOSIS — Z23 Encounter for immunization: Secondary | ICD-10-CM

## 2014-11-23 IMAGING — CR DG CHEST 2V
2 series · 2 of 2 positions shown · non-contrast
Comparison: None.

CLINICAL DATA: Cough with fever.

CHEST - 2 VIEW

[PA]
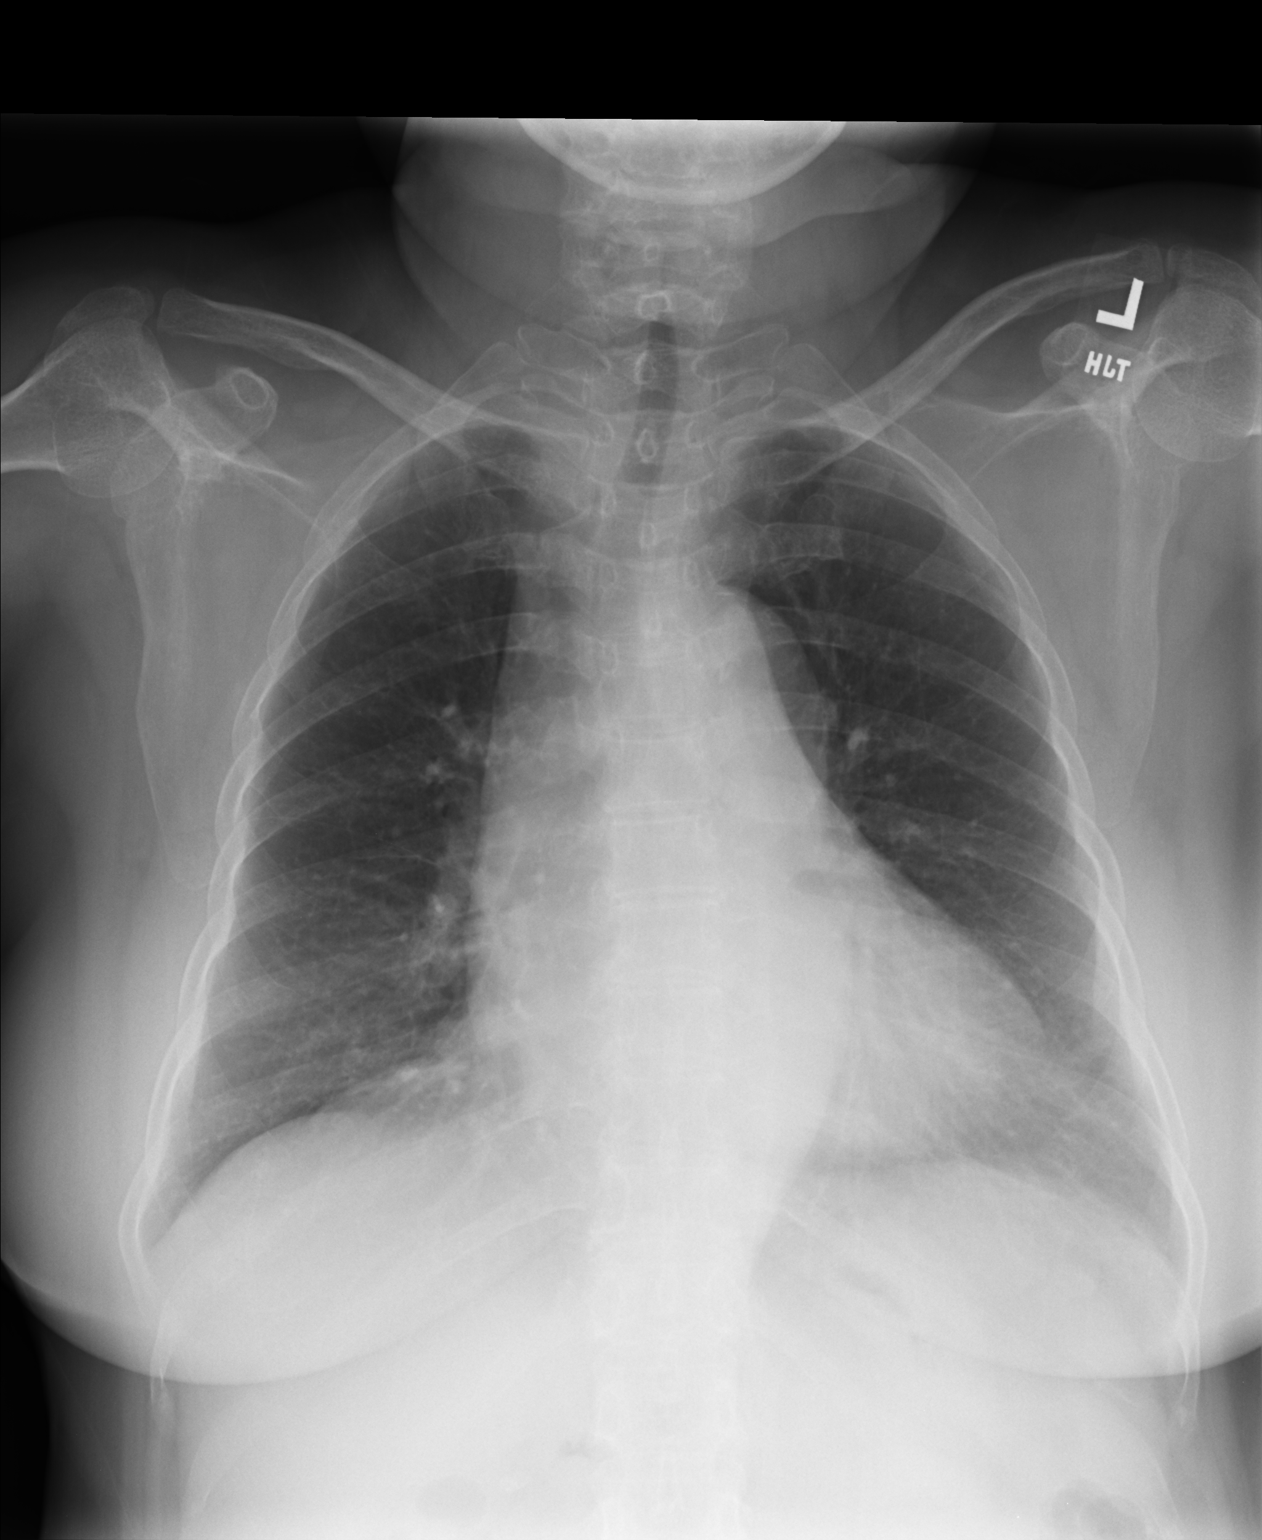

[lateral]
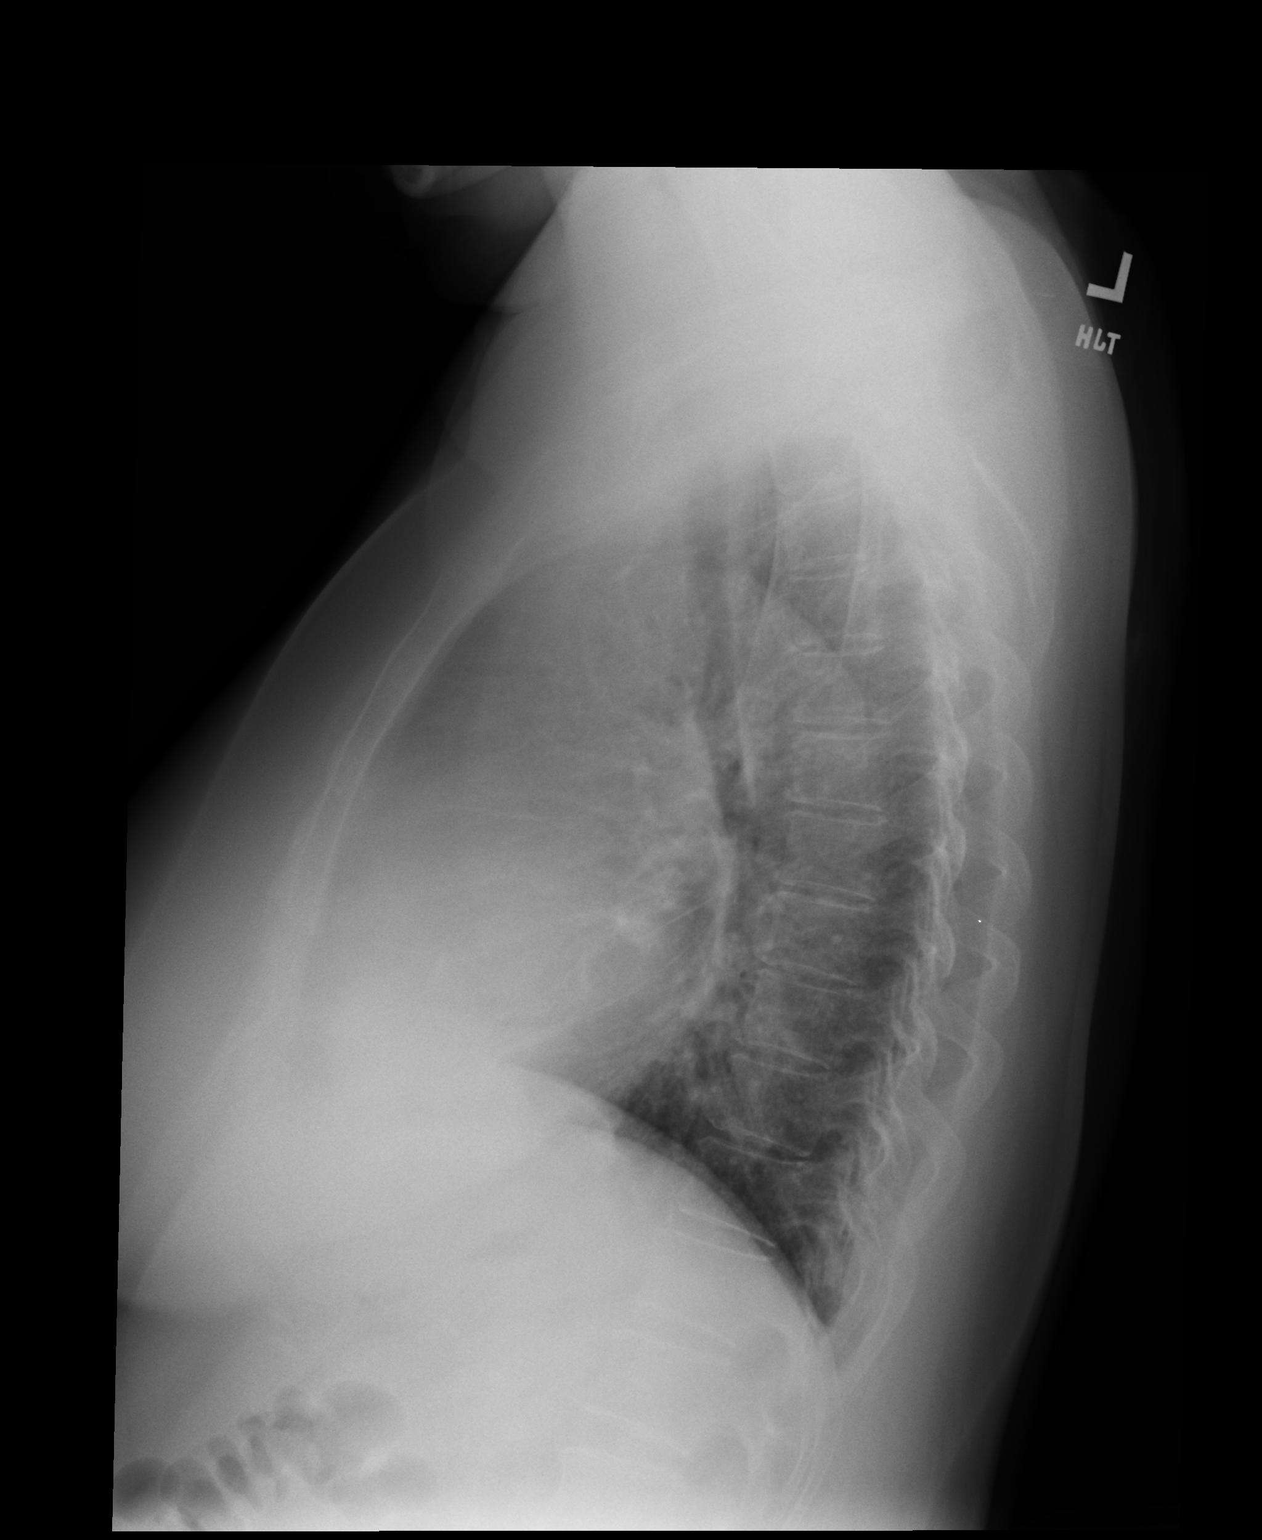

[2 of 2 positions shown; findings below may reference images not displayed]

FINDINGS: There is moderate cardiac enlargement.  The aorta is
ectatic without focal aneurysm.  The lungs appear clear.  Faint
density at the right cardiophrenic angle is likely due to
epicardial fat.  There is no pleural effusion or pneumothorax.
Osseous structures appear normal.
IMPRESSION: Cardiomegaly.  No acute cardiopulmonary process identified.

## 2015-03-04 ENCOUNTER — Other Ambulatory Visit (INDEPENDENT_AMBULATORY_CARE_PROVIDER_SITE_OTHER): Payer: BLUE CROSS/BLUE SHIELD

## 2015-03-04 ENCOUNTER — Other Ambulatory Visit: Payer: Self-pay

## 2015-03-04 ENCOUNTER — Other Ambulatory Visit: Payer: Self-pay | Admitting: Family Medicine

## 2015-03-04 DIAGNOSIS — I10 Essential (primary) hypertension: Secondary | ICD-10-CM

## 2015-03-04 DIAGNOSIS — E785 Hyperlipidemia, unspecified: Secondary | ICD-10-CM

## 2015-03-04 DIAGNOSIS — E119 Type 2 diabetes mellitus without complications: Secondary | ICD-10-CM

## 2015-03-04 DIAGNOSIS — L309 Dermatitis, unspecified: Secondary | ICD-10-CM

## 2015-03-04 DIAGNOSIS — M150 Primary generalized (osteo)arthritis: Secondary | ICD-10-CM

## 2015-03-04 DIAGNOSIS — E1165 Type 2 diabetes mellitus with hyperglycemia: Secondary | ICD-10-CM

## 2015-03-04 DIAGNOSIS — M8949 Other hypertrophic osteoarthropathy, multiple sites: Secondary | ICD-10-CM

## 2015-03-04 DIAGNOSIS — IMO0002 Reserved for concepts with insufficient information to code with codable children: Secondary | ICD-10-CM

## 2015-03-04 DIAGNOSIS — E1139 Type 2 diabetes mellitus with other diabetic ophthalmic complication: Secondary | ICD-10-CM

## 2015-03-04 DIAGNOSIS — M159 Polyosteoarthritis, unspecified: Secondary | ICD-10-CM

## 2015-03-04 LAB — COMPREHENSIVE METABOLIC PANEL
ALBUMIN: 4.1 g/dL (ref 3.5–5.2)
ALT: 43 U/L — ABNORMAL HIGH (ref 0–35)
AST: 42 U/L — ABNORMAL HIGH (ref 0–37)
Alkaline Phosphatase: 43 U/L (ref 39–117)
BUN: 9 mg/dL (ref 6–23)
CALCIUM: 9.7 mg/dL (ref 8.4–10.5)
CHLORIDE: 100 meq/L (ref 96–112)
CO2: 30 mEq/L (ref 19–32)
Creatinine, Ser: 0.81 mg/dL (ref 0.40–1.20)
GFR: 75.8 mL/min (ref >60.00)
Glucose, Bld: 94 mg/dL (ref 70–99)
POTASSIUM: 4.2 meq/L (ref 3.5–5.1)
Sodium: 136 mEq/L (ref 135–145)
Total Bilirubin: 0.6 mg/dL (ref 0.2–1.2)
Total Protein: 8 g/dL (ref 6.0–8.3)

## 2015-03-04 LAB — POC URINALSYSI DIPSTICK (AUTOMATED)
BILIRUBIN UA: NEGATIVE
Glucose, UA: NEGATIVE
KETONES UA: NEGATIVE
Nitrite, UA: NEGATIVE
PROTEIN UA: NEGATIVE
Spec Grav, UA: 1.025
Urobilinogen, UA: NEGATIVE
pH, UA: 6.5

## 2015-03-04 LAB — LIPID PANEL
CHOLESTEROL: 200 mg/dL (ref 0–200)
HDL: 40 mg/dL (ref >39.00)
LDL CALC: 127 mg/dL — AB (ref 0–99)
NonHDL: 159.51
TRIGLYCERIDES: 164 mg/dL — AB (ref 0.0–149.0)
Total CHOL/HDL Ratio: 5
VLDL: 32.8 mg/dL (ref 0.0–40.0)

## 2015-03-04 LAB — HEMOGLOBIN A1C: Hgb A1c MFr Bld: 6.5 % (ref 4.6–6.5)

## 2015-03-04 MED ORDER — CHLORTHALIDONE 25 MG PO TABS: 25.0000 mg | ORAL_TABLET | Freq: Every day | ORAL | Status: DC

## 2015-03-04 MED ORDER — NITROGLYCERIN 0.4 MG SL SUBL: 0.4000 mg | SUBLINGUAL_TABLET | SUBLINGUAL | Status: DC | PRN

## 2015-03-04 MED ORDER — FENOFIBRATE MICRONIZED 130 MG PO CAPS: 130.0000 mg | ORAL_CAPSULE | Freq: Every day | ORAL | Status: DC

## 2015-03-04 MED ORDER — ATORVASTATIN CALCIUM 20 MG PO TABS: 20.0000 mg | ORAL_TABLET | Freq: Every day | ORAL | Status: DC

## 2015-03-04 MED ORDER — MELOXICAM 15 MG PO TABS: 15.0000 mg | ORAL_TABLET | Freq: Every day | ORAL | Status: DC

## 2015-03-04 MED ORDER — LOSARTAN POTASSIUM 50 MG PO TABS
50.0000 mg | ORAL_TABLET | Freq: Every day | ORAL | Status: DC
Start: 2015-03-04 — End: 2015-06-28

## 2015-03-04 MED ORDER — METOPROLOL TARTRATE 25 MG PO TABS: 25.0000 mg | ORAL_TABLET | Freq: Two times a day (BID) | ORAL | Status: DC

## 2015-03-04 MED ORDER — SITAGLIPTIN PHOS-METFORMIN HCL 50-1000 MG PO TABS: 1.0000 | ORAL_TABLET | Freq: Two times a day (BID) | ORAL | Status: DC

## 2015-03-04 MED ORDER — MOMETASONE FUROATE 0.1 % EX CREA: 1.0000 "application " | TOPICAL_CREAM | Freq: Every day | CUTANEOUS | Status: DC

## 2015-03-04 NOTE — Addendum Note (Signed)
Addended by: Eustace Quail on: 03/04/2015 09:42 AM   Modules accepted: Orders

## 2015-03-05 LAB — URINE CULTURE: Colony Count: >=100000

## 2015-03-08 ENCOUNTER — Telehealth: Payer: Self-pay | Admitting: General Practice

## 2015-03-08 NOTE — Telephone Encounter (Signed)
Received a PA request [facility] from [facility]. PA began today on covermymeds

## 2015-03-10 NOTE — Telephone Encounter (Signed)
Received Denial from Insurance for Janumet XR; the only alternative medication that must be tried is [employer] XR/SLS 02/22

## 2015-03-11 MED ORDER — SAXAGLIPTIN-METFORMIN ER 5-1000 MG PO TB24: 1.0000 | ORAL_TABLET | Freq: Every day | ORAL | Status: DC

## 2015-03-11 NOTE — Telephone Encounter (Signed)
Change to kombiglyze xr 05/998 er 1 po qd #90  1 refill

## 2015-03-11 NOTE — Telephone Encounter (Signed)
Mr.Le has been made aware and verbalized understanding, Rx faxed.     KP

## 2015-03-16 ENCOUNTER — Ambulatory Visit (INDEPENDENT_AMBULATORY_CARE_PROVIDER_SITE_OTHER): Payer: BLUE CROSS/BLUE SHIELD | Admitting: Family Medicine

## 2015-03-16 ENCOUNTER — Encounter: Payer: Self-pay | Admitting: Family Medicine

## 2015-03-16 ENCOUNTER — Encounter: Payer: 59 | Admitting: Family Medicine

## 2015-03-16 VITALS — BP 134/84 | HR 75 | Temp 97.5°F | Ht <= 58 in | Wt 131.4 lb

## 2015-03-16 DIAGNOSIS — Z1231 Encounter for screening mammogram for malignant neoplasm of breast: Secondary | ICD-10-CM

## 2015-03-16 DIAGNOSIS — Z0000 Encounter for general adult medical examination without abnormal findings: Secondary | ICD-10-CM | POA: Diagnosis not present

## 2015-03-16 DIAGNOSIS — E2839 Other primary ovarian failure: Secondary | ICD-10-CM | POA: Diagnosis not present

## 2015-03-16 NOTE — Progress Notes (Signed)
Subjective:     Dominique Hayes is a 65 y.o. female and is here for a comprehensive physical exam. The patient reports no problems.  Social History   Social History  . Marital Status: Married    Spouse Name: N/A  . Number of Children: 1  . Years of Education: N/A   Occupational History  . nail tech    Social History Main Topics  . Smoking status: Never Smoker   . Smokeless tobacco: Never Used  . Alcohol Use: No  . Drug Use: No  . Sexual Activity: Yes   Other Topics Concern  . Not on file   Social History Narrative   Marital status: married      Employment: employed; p/t [employer]   Exercise--- treadmill everday for 30 min   Health Maintenance  Topic Date Due  . MAMMOGRAM  10/05/2001  . OPHTHALMOLOGY EXAM  01/28/2015  . COLONOSCOPY  03/15/2016 (Originally 10/05/2001)  . HIV Screening  03/15/2016 (Originally 10/06/1965)  . INFLUENZA VACCINE  08/17/2015  . HEMOGLOBIN A1C  09/01/2015  . FOOT EXAM  10/16/2015  . PAP SMEAR  05/26/2016  . PNEUMOCOCCAL POLYSACCHARIDE VACCINE (2) 12/09/2018  . TETANUS/TDAP  11/11/2022  . ZOSTAVAX  Completed  . Hepatitis C Screening  Completed    The following portions of the patient's history were reviewed and updated as appropriate:  She  has a past medical history of Hypertension; Hyperlipidemia; DM (diabetes mellitus) (HCC); Hepatitis B; and Mild diastolic dysfunction. She  does not have any pertinent problems on file. She  has past surgical history that includes Abdominal hysterectomy. Her family history includes Hypertension in her father, mother, sister, and sister. She  reports that she has never smoked. She has never used smokeless tobacco. She reports that she does not drink alcohol or use illicit drugs. She has a current medication list which includes the following prescription(s): atorvastatin, chlorthalidone, [facility] aspirin low dose, fenofibrate micronized, glucose blood, freestyle, losartan, meloxicam, metoprolol tartrate, mometasone,  nitroglycerin, nystatin cream, and saxagliptin-metformin. Current Outpatient Prescriptions on File Prior to Visit  Medication Sig Dispense Refill  . atorvastatin (LIPITOR) 20 MG tablet Take 1 tablet (20 mg total) by mouth daily. 90 tablet 3  . chlorthalidone (HYGROTON) 25 MG tablet Take 1 tablet (25 mg total) by mouth daily. 30 tablet 5  . [facility] ASPIRIN LOW DOSE 81 MG EC tablet TAKE 1 TABLET BY MOUTH DAILY. 30 tablet 3  . fenofibrate micronized (ANTARA) 130 MG capsule Take 1 capsule (130 mg total) by mouth daily before breakfast. 270 capsule 1  . glucose blood (FREESTYLE LITE) test strip Check blood sugar once daily Dx:250.00 (Freestyle Lite Meter) 100 each 12  . Lancets (FREESTYLE) lancets Check blood sugar daily Dx: 250.00 (Freestyle Lite Meter) 100 each 12  . losartan (COZAAR) 50 MG tablet Take 1 tablet (50 mg total) by mouth daily. 30 tablet 5  . meloxicam (MOBIC) 15 MG tablet Take 1 tablet (15 mg total) by mouth daily. For hand pain; Vietnemese label if available 90 tablet 1  . metoprolol tartrate (LOPRESSOR) 25 MG tablet Take 1 tablet (25 mg total) by mouth 2 (two) times daily. 180 tablet 1  . mometasone (ELOCON) 0.1 % cream Apply 1 application topically daily. 135 g 1  . nitroGLYCERIN (NITROSTAT) 0.4 MG SL tablet Place 1 tablet (0.4 mg total) under the tongue every 5 (five) minutes as needed for chest pain. 30 tablet 0  . nystatin cream (MYCOSTATIN) Apply 1 application topically 2 (two) times  daily. 30 g 0  . Saxagliptin-Metformin (KOMBIGLYZE XR) 05-998 MG TB24 Take 1 tablet by mouth daily. 90 tablet 1   No current facility-administered medications on file prior to visit.   She has No Known Allergies..  Review of Systems Review of Systems  Constitutional: Negative for activity change, appetite change and fatigue.  HENT: Negative for hearing loss, congestion, tinnitus and ear discharge.  dentist q48m Eyes: Negative for visual disturbance (see optho q1y -- vision corrected to 20/20 with  glasses).  Respiratory: Negative for cough, chest tightness and shortness of breath.   Cardiovascular: Negative for chest pain, palpitations and leg swelling.  Gastrointestinal: Negative for abdominal pain, diarrhea, constipation and abdominal distention.  Genitourinary: Negative for urgency, frequency, decreased urine volume and difficulty urinating.  Musculoskeletal: Negative for back pain, arthralgias and gait problem.  Skin: Negative for color change, pallor and rash.  Neurological: Negative for dizziness, light-headedness, numbness and headaches.  Hematological: Negative for adenopathy. Does not bruise/bleed easily.  Psychiatric/Behavioral: Negative for suicidal ideas, confusion, sleep disturbance, self-injury, dysphoric mood, decreased concentration and agitation.       Objective:    BP 134/84 mmHg  Pulse 75  Temp(Src) 97.5 F (36.4 C) (Oral)  Ht  (1.473 m)  Wt 131 lb 6.4 oz (59.603 kg)  BMI 27.47 kg/m2  SpO2 99% General appearance: alert, cooperative, appears stated age and no distress Head: Normocephalic, without obvious abnormality, atraumatic Eyes: conjunctivae/corneas clear. PERRL, EOM's intact. Fundi benign. Ears: normal TM's and external ear canals both ears Nose: Nares normal. Septum midline. Mucosa normal. No drainage or sinus tenderness. Throat: lips, mucosa, and tongue normal; teeth and gums normal Neck: no adenopathy, no carotid bruit, no JVD, supple, symmetrical, trachea midline and thyroid not enlarged, symmetric, no tenderness/mass/nodules Back: symmetric, no curvature. ROM normal. No CVA tenderness. Lungs: clear to auscultation bilaterally Breasts: normal appearance, no masses or tenderness Heart: regular rate and rhythm, S1, S2 normal, no murmur, click, rub or gallop Abdomen: soft, non-tender; bowel sounds normal; no masses,  no organomegaly Pelvic: not indicated; status post hysterectomy, negative ROS Extremities: extremities normal, atraumatic, no  cyanosis or edema Pulses: 2+ and symmetric Skin: Skin color, texture, turgor normal. No rashes or lesions Lymph nodes: Cervical, supraclavicular, and axillary nodes normal. Neurologic: Alert and oriented X 3, normal strength and tone. Normal symmetric reflexes. Normal coordination and gait Psych- no depression, no anxiety      Assessment:    Healthy female exam.       Plan:     ghm utd Check labs See After Visit Summary for Counseling Recommendations   1. Encounter for screening mammogram for malignant neoplasm of breast   - MM Digital Screening; Future  2. Estrogen deficiency   - DG Bone Density; Future  3. Preventative health care See above

## 2015-03-16 NOTE — Patient Instructions (Addendum)
Please schedule your Mammogram and Bone Density @ [facility] Address: [address], [place], [place] [address]  Phone: [phone]  Hours:   Tuesday 7AM-5PM  Wednesday 7AM-5PM  Thursday 7AM-5PM  Friday 7AM-5PM  Saturday Closed  Sunday Closed  Monday 7AM-6:30PM   Preventive Care for Adults, Female A healthy lifestyle and preventive care can promote health and wellness. Preventive health guidelines for women include the following key practices.  A routine yearly physical is a good way to check with your health care provider about your health and preventive screening. It is a chance to share any concerns and updates on your health and to receive a thorough exam.  Visit your dentist for a routine exam and preventive care every 6 months. Brush your teeth twice a day and floss once a day. Good oral hygiene prevents tooth decay and gum disease.  The frequency of eye exams is based on your age, health, family medical history, use of contact lenses, and other factors. Follow your health care provider's recommendations for frequency of eye exams.  Eat a healthy diet. Foods like vegetables, fruits, whole grains, low-fat dairy products, and lean protein foods contain the nutrients you need without too many calories. Decrease your intake of foods high in solid fats, added sugars, and salt. Eat the right amount of calories for you.Get information about a proper diet from your health care provider, if necessary.  Regular physical exercise is one of the most important things you can do for your health. Most adults should get at least 150 minutes of moderate-intensity exercise (any activity that increases your heart rate and causes you to sweat) each week. In addition, most adults need muscle-strengthening exercises on 2 or more days a week.  Maintain a healthy weight. The body mass index (BMI) is a screening tool to identify possible weight problems. It provides an estimate of body  fat based on height and weight. Your health care provider can find your BMI and can help you achieve or maintain a healthy weight.For adults 20 years and older:  A BMI below 18.5 is considered underweight.  A BMI of 18.5 to 24.9 is normal.  A BMI of 25 to 29.9 is considered overweight.  A BMI of 30 and above is considered obese.  Maintain normal blood lipids and cholesterol levels by exercising and minimizing your intake of saturated fat. Eat a balanced diet with plenty of fruit and vegetables. Blood tests for lipids and cholesterol should begin at age 17 and be repeated every 5 years. If your lipid or cholesterol levels are high, you are over 50, or you are at high risk for heart disease, you may need your cholesterol levels checked more frequently.Ongoing high lipid and cholesterol levels should be treated with medicines if diet and exercise are not working.  If you smoke, find out from your health care provider how to quit. If you do not use tobacco, do not start.  Lung cancer screening is recommended for adults aged 7-80 years who are at high risk for developing lung cancer because of a history of smoking. A yearly low-dose CT scan of the lungs is recommended for people who have at least a 30-pack-year history of smoking and are a current smoker or have quit within the past 15 years. A pack year of smoking is smoking an average of 1 pack of cigarettes a day for 1 year (for example: 1 pack a day for 30 years or 2 packs a day  for 15 years). Yearly screening should continue until the smoker has stopped smoking for at least 15 years. Yearly screening should be stopped for people who develop a health problem that would prevent them from having lung cancer treatment.  If you are pregnant, do not drink alcohol. If you are breastfeeding, be very cautious about drinking alcohol. If you are not pregnant and choose to drink alcohol, do not have more than 1 drink per day. One drink is considered to be 12  ounces (355 mL) of beer, 5 ounces (148 mL) of wine, or 1.5 ounces (44 mL) of liquor.  Avoid use of street drugs. Do not share needles with anyone. Ask for help if you need support or instructions about stopping the use of drugs.  High blood pressure causes heart disease and increases the risk of stroke. Your blood pressure should be checked at least every 1 to 2 years. Ongoing high blood pressure should be treated with medicines if weight loss and exercise do not work.  If you are 41-69 years old, ask your health care provider if you should take aspirin to prevent strokes.  Diabetes screening is done by taking a blood sample to check your blood glucose level after you have not eaten for a certain period of time (fasting). If you are not overweight and you do not have risk factors for diabetes, you should be screened once every 3 years starting at age 78. If you are overweight or obese and you are 72-70 years of age, you should be screened for diabetes every year as part of your cardiovascular risk assessment.  Breast cancer screening is essential preventive care for women. You should practice "breast self-awareness." This means understanding the normal appearance and feel of your breasts and may include breast self-examination. Any changes detected, no matter how small, should be reported to a health care provider. Women in their 65s and 30s should have a clinical breast exam (CBE) by a health care provider as part of a regular health exam every 1 to 3 years. After age 62, women should have a CBE every year. Starting at age 18, women should consider having a mammogram (breast X-ray test) every year. Women who have a family history of breast cancer should talk to their health care provider about genetic screening. Women at a high risk of breast cancer should talk to their health care providers about having an MRI and a mammogram every year.  Breast cancer gene (BRCA)-related cancer risk assessment is  recommended for women who have family members with BRCA-related cancers. BRCA-related cancers include breast, ovarian, tubal, and peritoneal cancers. Having family members with these cancers may be associated with an increased risk for harmful changes (mutations) in the breast cancer genes BRCA1 and BRCA2. Results of the assessment will determine the need for genetic counseling and BRCA1 and BRCA2 testing.  Your health care provider may recommend that you be screened regularly for cancer of the pelvic organs (ovaries, uterus, and vagina). This screening involves a pelvic examination, including checking for microscopic changes to the surface of your cervix (Pap test). You may be encouraged to have this screening done every 3 years, beginning at age 32.  For women ages 42-65, health care providers may recommend pelvic exams and Pap testing every 3 years, or they may recommend the Pap and pelvic exam, combined with testing for human papilloma virus (HPV), every 5 years. Some types of HPV increase your risk of cervical cancer. Testing for HPV may also be  done on women of any age with unclear Pap test results.  Other health care providers may not recommend any screening for nonpregnant women who are considered low risk for pelvic cancer and who do not have symptoms. Ask your health care provider if a screening pelvic exam is right for you.  If you have had past treatment for cervical cancer or a condition that could lead to cancer, you need Pap tests and screening for cancer for at least 20 years after your treatment. If Pap tests have been discontinued, your risk factors (such as having a new sexual partner) need to be reassessed to determine if screening should resume. Some women have medical problems that increase the chance of getting cervical cancer. In these cases, your health care provider may recommend more frequent screening and Pap tests.  Colorectal cancer can be detected and often prevented. Most  routine colorectal cancer screening begins at the age of 16 years and continues through age 12 years. However, your health care provider may recommend screening at an earlier age if you have risk factors for colon cancer. On a yearly basis, your health care provider may provide home test kits to check for hidden blood in the stool. Use of a small camera at the end of a tube, to directly examine the colon (sigmoidoscopy or colonoscopy), can detect the earliest forms of colorectal cancer. Talk to your health care provider about this at age 15, when routine screening begins. Direct exam of the colon should be repeated every 5-10 years through age 39 years, unless early forms of precancerous polyps or small growths are found.  People who are at an increased risk for hepatitis B should be screened for this virus. You are considered at high risk for hepatitis B if:  You were born in a country where hepatitis B occurs often. Talk with your health care provider about which countries are considered high risk.  Your parents were born in a high-risk country and you have not received a shot to protect against hepatitis B (hepatitis B vaccine).  You have HIV or AIDS.  You use needles to inject street drugs.  You live with, or have sex with, someone who has hepatitis B.  You get hemodialysis treatment.  You take certain medicines for conditions like cancer, organ transplantation, and autoimmune conditions.  Hepatitis C blood testing is recommended for all people born from [birth year] through 1965 and any individual with known risks for hepatitis C.  Practice safe sex. Use condoms and avoid high-risk sexual practices to reduce the spread of sexually transmitted infections (STIs). STIs include gonorrhea, chlamydia, syphilis, trichomonas, herpes, HPV, and human immunodeficiency virus (HIV). Herpes, HIV, and HPV are viral illnesses that have no cure. They can result in disability, cancer, and death.  You should be  screened for sexually transmitted illnesses (STIs) including gonorrhea and chlamydia if:  You are sexually active and are younger than 24 years.  You are older than 24 years and your health care provider tells you that you are at risk for this type of infection.  Your sexual activity has changed since you were last screened and you are at an increased risk for chlamydia or gonorrhea. Ask your health care provider if you are at risk.  If you are at risk of being infected with HIV, it is recommended that you take a prescription medicine daily to prevent HIV infection. This is called preexposure prophylaxis (PrEP). You are considered at risk if:  You are sexually active  and do not regularly use condoms or know the HIV status of your partner(s).  You take drugs by injection.  You are sexually active with a partner who has HIV.  Talk with your health care provider about whether you are at high risk of being infected with HIV. If you choose to begin PrEP, you should first be tested for HIV. You should then be tested every 3 months for as long as you are taking PrEP.  Osteoporosis is a disease in which the bones lose minerals and strength with aging. This can result in serious bone fractures or breaks. The risk of osteoporosis can be identified using a bone density scan. Women ages 56 years and over and women at risk for fractures or osteoporosis should discuss screening with their health care providers. Ask your health care provider whether you should take a calcium supplement or vitamin D to reduce the rate of osteoporosis.  Menopause can be associated with physical symptoms and risks. Hormone replacement therapy is available to decrease symptoms and risks. You should talk to your health care provider about whether hormone replacement therapy is right for you.  Use sunscreen. Apply sunscreen liberally and repeatedly throughout the day. You should seek shade when your shadow is shorter than you.  Protect yourself by wearing long sleeves, pants, a wide-brimmed hat, and sunglasses year round, whenever you are outdoors.  Once a month, do a whole body skin exam, using a mirror to look at the skin on your back. Tell your health care provider of new moles, moles that have irregular borders, moles that are larger than a pencil eraser, or moles that have changed in shape or color.  Stay current with required vaccines (immunizations).  Influenza vaccine. All adults should be immunized every year.  Tetanus, diphtheria, and acellular pertussis (Td, Tdap) vaccine. Pregnant women should receive 1 dose of Tdap vaccine during each pregnancy. The dose should be obtained regardless of the length of time since the last dose. Immunization is preferred during the 27th-36th week of gestation. An adult who has not previously received Tdap or who does not know her vaccine status should receive 1 dose of Tdap. This initial dose should be followed by tetanus and diphtheria toxoids (Td) booster doses every 10 years. Adults with an unknown or incomplete history of completing a 3-dose immunization series with Td-containing vaccines should begin or complete a primary immunization series including a Tdap dose. Adults should receive a Td booster every 10 years.  Varicella vaccine. An adult without evidence of immunity to varicella should receive 2 doses or a second dose if she has previously received 1 dose. Pregnant females who do not have evidence of immunity should receive the first dose after pregnancy. This first dose should be obtained before leaving the health care facility. The second dose should be obtained 4-8 weeks after the first dose.  Human papillomavirus (HPV) vaccine. Females aged 13-26 years who have not received the vaccine previously should obtain the 3-dose series. The vaccine is not recommended for use in pregnant females. However, pregnancy testing is not needed before receiving a dose. If a female is  found to be pregnant after receiving a dose, no treatment is needed. In that case, the remaining doses should be delayed until after the pregnancy. Immunization is recommended for any person with an immunocompromised condition through the age of 20 years if she did not get any or all doses earlier. During the 3-dose series, the second dose should be obtained 4-8 weeks  after the first dose. The third dose should be obtained 24 weeks after the first dose and 16 weeks after the second dose.  Zoster vaccine. One dose is recommended for adults aged 100 years or older unless certain conditions are present.  Measles, mumps, and rubella (MMR) vaccine. Adults born before [birth year] generally are considered immune to measles and mumps. Adults born in [birth year] or later should have 1 or more doses of MMR vaccine unless there is a contraindication to the vaccine or there is laboratory evidence of immunity to each of the three diseases. A routine second dose of MMR vaccine should be obtained at least 28 days after the first dose for students attending postsecondary schools, health care workers, or international travelers. People who received inactivated measles vaccine or an unknown type of measles vaccine during 1963-1967 should receive 2 doses of MMR vaccine. People who received inactivated mumps vaccine or an unknown type of mumps vaccine before 1979 and are at high risk for mumps infection should consider immunization with 2 doses of MMR vaccine. For females of childbearing age, rubella immunity should be determined. If there is no evidence of immunity, females who are not pregnant should be vaccinated. If there is no evidence of immunity, females who are pregnant should delay immunization until after pregnancy. Unvaccinated health care workers born before [birth year] who lack laboratory evidence of measles, mumps, or rubella immunity or laboratory confirmation of disease should consider measles and mumps immunization with 2 doses of MMR  vaccine or rubella immunization with 1 dose of MMR vaccine.  Pneumococcal 13-valent conjugate (PCV13) vaccine. When indicated, a person who is uncertain of his immunization history and has no record of immunization should receive the PCV13 vaccine. All adults 47 years of age and older should receive this vaccine. An adult aged 4 years or older who has certain medical conditions and has not been previously immunized should receive 1 dose of PCV13 vaccine. This PCV13 should be followed with a dose of pneumococcal polysaccharide (PPSV23) vaccine. Adults who are at high risk for pneumococcal disease should obtain the PPSV23 vaccine at least 8 weeks after the dose of PCV13 vaccine. Adults older than 64 years of age who have normal immune system function should obtain the PPSV23 vaccine dose at least 1 year after the dose of PCV13 vaccine.  Pneumococcal polysaccharide (PPSV23) vaccine. When PCV13 is also indicated, PCV13 should be obtained first. All adults aged 22 years and older should be immunized. An adult younger than age 74 years who has certain medical conditions should be immunized. Any person who resides in a nursing home or long-term care facility should be immunized. An adult smoker should be immunized. People with an immunocompromised condition and certain other conditions should receive both PCV13 and PPSV23 vaccines. People with human immunodeficiency virus (HIV) infection should be immunized as soon as possible after diagnosis. Immunization during chemotherapy or radiation therapy should be avoided. Routine use of PPSV23 vaccine is not recommended for American Indians, [place] Natives, or people younger than 65 years unless there are medical conditions that require PPSV23 vaccine. When indicated, people who have unknown immunization and have no record of immunization should receive PPSV23 vaccine. One-time revaccination 5 years after the first dose of PPSV23 is recommended for people aged 19-64 years  who have chronic kidney failure, nephrotic syndrome, asplenia, or immunocompromised conditions. People who received 1-2 doses of PPSV23 before age 21 years should receive another dose of PPSV23 vaccine at age 75 years or later if at least  5 years have passed since the previous dose. Doses of PPSV23 are not needed for people immunized with PPSV23 at or after age 39 years.  Meningococcal vaccine. Adults with asplenia or persistent complement component deficiencies should receive 2 doses of quadrivalent meningococcal conjugate (MenACWY-D) vaccine. The doses should be obtained at least 2 months apart. Microbiologists working with certain meningococcal bacteria, [employer] recruits, people at risk during an outbreak, and people who travel to or live in countries with a high rate of meningitis should be immunized. A first-year college student up through age 86 years who is living in a residence hall should receive a dose if she did not receive a dose on or after her 16th birthday. Adults who have certain high-risk conditions should receive one or more doses of vaccine.  Hepatitis A vaccine. Adults who wish to be protected from this disease, have certain high-risk conditions, work with hepatitis A-infected animals, work in hepatitis A research labs, or travel to or work in countries with a high rate of hepatitis A should be immunized. Adults who were previously unvaccinated and who anticipate close contact with an international adoptee during the first 60 days after arrival in the [place] from a country with a high rate of hepatitis A should be immunized.  Hepatitis B vaccine. Adults who wish to be protected from this disease, have certain high-risk conditions, may be exposed to blood or other infectious body fluids, are household contacts or sex partners of hepatitis B positive people, are clients or workers in certain care facilities, or travel to or work in countries with a high rate of hepatitis B should be  immunized.  Haemophilus influenzae type b (Hib) vaccine. A previously unvaccinated person with asplenia or sickle cell disease or having a scheduled splenectomy should receive 1 dose of Hib vaccine. Regardless of previous immunization, a recipient of a hematopoietic stem cell transplant should receive a 3-dose series 6-12 months after her successful transplant. Hib vaccine is not recommended for adults with HIV infection. Preventive Services / Frequency Ages 96 to 57 years  Blood pressure check.** / Every 3-5 years.  Lipid and cholesterol check.** / Every 5 years beginning at age 29.  Clinical breast exam.** / Every 3 years for women in their 51s and 71s.  BRCA-related cancer risk assessment.** / For women who have family members with a BRCA-related cancer (breast, ovarian, tubal, or peritoneal cancers).  Pap test.** / Every 2 years from ages 97 through 22. Every 3 years starting at age 49 through age 63 or 41 with a history of 3 consecutive normal Pap tests.  HPV screening.** / Every 3 years from ages 27 through ages 39 to 49 with a history of 3 consecutive normal Pap tests.  Hepatitis C blood test.** / For any individual with known risks for hepatitis C.  Skin self-exam. / Monthly.  Influenza vaccine. / Every year.  Tetanus, diphtheria, and acellular pertussis (Tdap, Td) vaccine.** / Consult your health care provider. Pregnant women should receive 1 dose of Tdap vaccine during each pregnancy. 1 dose of Td every 10 years.  Varicella vaccine.** / Consult your health care provider. Pregnant females who do not have evidence of immunity should receive the first dose after pregnancy.  HPV vaccine. / 3 doses over 6 months, if 74 and younger. The vaccine is not recommended for use in pregnant females. However, pregnancy testing is not needed before receiving a dose.  Measles, mumps, rubella (MMR) vaccine.** / You need at least 1 dose of  MMR if you were born in 1957 or later. You may also need  a 2nd dose. For females of childbearing age, rubella immunity should be determined. If there is no evidence of immunity, females who are not pregnant should be vaccinated. If there is no evidence of immunity, females who are pregnant should delay immunization until after pregnancy.  Pneumococcal 13-valent conjugate (PCV13) vaccine.** / Consult your health care provider.  Pneumococcal polysaccharide (PPSV23) vaccine.** / 1 to 2 doses if you smoke cigarettes or if you have certain conditions.  Meningococcal vaccine.** / 1 dose if you are age 75 to 29 years and a Market researcher living in a residence hall, or have one of several medical conditions, you need to get vaccinated against meningococcal disease. You may also need additional booster doses.  Hepatitis A vaccine.** / Consult your health care provider.  Hepatitis B vaccine.** / Consult your health care provider.  Haemophilus influenzae type b (Hib) vaccine.** / Consult your health care provider. Ages 5 to 56 years  Blood pressure check.** / Every year.  Lipid and cholesterol check.** / Every 5 years beginning at age 62 years.  Lung cancer screening. / Every year if you are aged 45-80 years and have a 30-pack-year history of smoking and currently smoke or have quit within the past 15 years. Yearly screening is stopped once you have quit smoking for at least 15 years or develop a health problem that would prevent you from having lung cancer treatment.  Clinical breast exam.** / Every year after age 23 years.  BRCA-related cancer risk assessment.** / For women who have family members with a BRCA-related cancer (breast, ovarian, tubal, or peritoneal cancers).  Mammogram.** / Every year beginning at age 25 years and continuing for as long as you are in good health. Consult with your health care provider.  Pap test.** / Every 3 years starting at age 73 years through age 76 or 34 years with a history of 3 consecutive normal Pap  tests.  HPV screening.** / Every 3 years from ages 48 years through ages 27 to 57 years with a history of 3 consecutive normal Pap tests.  Fecal occult blood test (FOBT) of stool. / Every year beginning at age 96 years and continuing until age 46 years. You may not need to do this test if you get a colonoscopy every 10 years.  Flexible sigmoidoscopy or colonoscopy.** / Every 5 years for a flexible sigmoidoscopy or every 10 years for a colonoscopy beginning at age 87 years and continuing until age 38 years.  Hepatitis C blood test.** / For all people born from [birth year] through 1965 and any individual with known risks for hepatitis C.  Skin self-exam. / Monthly.  Influenza vaccine. / Every year.  Tetanus, diphtheria, and acellular pertussis (Tdap/Td) vaccine.** / Consult your health care provider. Pregnant women should receive 1 dose of Tdap vaccine during each pregnancy. 1 dose of Td every 10 years.  Varicella vaccine.** / Consult your health care provider. Pregnant females who do not have evidence of immunity should receive the first dose after pregnancy.  Zoster vaccine.** / 1 dose for adults aged 37 years or older.  Measles, mumps, rubella (MMR) vaccine.** / You need at least 1 dose of MMR if you were born in 1957 or later. You may also need a second dose. For females of childbearing age, rubella immunity should be determined. If there is no evidence of immunity, females who are not pregnant should be vaccinated. If there  is no evidence of immunity, females who are pregnant should delay immunization until after pregnancy.  Pneumococcal 13-valent conjugate (PCV13) vaccine.** / Consult your health care provider.  Pneumococcal polysaccharide (PPSV23) vaccine.** / 1 to 2 doses if you smoke cigarettes or if you have certain conditions.  Meningococcal vaccine.** / Consult your health care provider.  Hepatitis A vaccine.** / Consult your health care provider.  Hepatitis B vaccine.** / Consult  your health care provider.  Haemophilus influenzae type b (Hib) vaccine.** / Consult your health care provider. Ages 12 years and over  Blood pressure check.** / Every year.  Lipid and cholesterol check.** / Every 5 years beginning at age 59 years.  Lung cancer screening. / Every year if you are aged 5-80 years and have a 30-pack-year history of smoking and currently smoke or have quit within the past 15 years. Yearly screening is stopped once you have quit smoking for at least 15 years or develop a health problem that would prevent you from having lung cancer treatment.  Clinical breast exam.** / Every year after age 74 years.  BRCA-related cancer risk assessment.** / For women who have family members with a BRCA-related cancer (breast, ovarian, tubal, or peritoneal cancers).  Mammogram.** / Every year beginning at age 63 years and continuing for as long as you are in good health. Consult with your health care provider.  Pap test.** / Every 3 years starting at age 17 years through age 73 or 42 years with 3 consecutive normal Pap tests. Testing can be stopped between 65 and 70 years with 3 consecutive normal Pap tests and no abnormal Pap or HPV tests in the past 10 years.  HPV screening.** / Every 3 years from ages 89 years through ages 60 or 86 years with a history of 3 consecutive normal Pap tests. Testing can be stopped between 65 and 70 years with 3 consecutive normal Pap tests and no abnormal Pap or HPV tests in the past 10 years.  Fecal occult blood test (FOBT) of stool. / Every year beginning at age 7 years and continuing until age 35 years. You may not need to do this test if you get a colonoscopy every 10 years.  Flexible sigmoidoscopy or colonoscopy.** / Every 5 years for a flexible sigmoidoscopy or every 10 years for a colonoscopy beginning at age 15 years and continuing until age 5 years.  Hepatitis C blood test.** / For all people born from [birth year] through 1965 and any  individual with known risks for hepatitis C.  Osteoporosis screening.** / A one-time screening for women ages 39 years and over and women at risk for fractures or osteoporosis.  Skin self-exam. / Monthly.  Influenza vaccine. / Every year.  Tetanus, diphtheria, and acellular pertussis (Tdap/Td) vaccine.** / 1 dose of Td every 10 years.  Varicella vaccine.** / Consult your health care provider.  Zoster vaccine.** / 1 dose for adults aged 93 years or older.  Pneumococcal 13-valent conjugate (PCV13) vaccine.** / Consult your health care provider.  Pneumococcal polysaccharide (PPSV23) vaccine.** / 1 dose for all adults aged 50 years and older.  Meningococcal vaccine.** / Consult your health care provider.  Hepatitis A vaccine.** / Consult your health care provider.  Hepatitis B vaccine.** / Consult your health care provider.  Haemophilus influenzae type b (Hib) vaccine.** / Consult your health care provider. ** Family history and personal history of risk and conditions may change your health care provider's recommendations.   This information is not intended to replace advice  given to you by your health care provider. Make sure you discuss any questions you have with your health care provider.   Document Released: 02/28/2001 Document Revised: 01/23/2014 Document Reviewed: 05/30/2010 Elsevier Interactive Patient Education 2016 [employer]. Preventive Care for Adults, Female A healthy lifestyle and preventive care can promote health and wellness. Preventive health guidelines for women include the following key practices.  A routine yearly physical is a good way to check with your health care provider about your health and preventive screening. It is a chance to share any concerns and updates on your health and to receive a thorough exam.  Visit your dentist for a routine exam and preventive care every 6 months. Brush your teeth twice a day and floss once a day. Good oral hygiene  prevents tooth decay and gum disease.  The frequency of eye exams is based on your age, health, family medical history, use of contact lenses, and other factors. Follow your health care provider's recommendations for frequency of eye exams.  Eat a healthy diet. Foods like vegetables, fruits, whole grains, low-fat dairy products, and lean protein foods contain the nutrients you need without too many calories. Decrease your intake of foods high in solid fats, added sugars, and salt. Eat the right amount of calories for you.Get information about a proper diet from your health care provider, if necessary.  Regular physical exercise is one of the most important things you can do for your health. Most adults should get at least 150 minutes of moderate-intensity exercise (any activity that increases your heart rate and causes you to sweat) each week. In addition, most adults need muscle-strengthening exercises on 2 or more days a week.  Maintain a healthy weight. The body mass index (BMI) is a screening tool to identify possible weight problems. It provides an estimate of body fat based on height and weight. Your health care provider can find your BMI and can help you achieve or maintain a healthy weight.For adults 20 years and older:  A BMI below 18.5 is considered underweight.  A BMI of 18.5 to 24.9 is normal.  A BMI of 25 to 29.9 is considered overweight.  A BMI of 30 and above is considered obese.  Maintain normal blood lipids and cholesterol levels by exercising and minimizing your intake of saturated fat. Eat a balanced diet with plenty of fruit and vegetables. Blood tests for lipids and cholesterol should begin at age 52 and be repeated every 5 years. If your lipid or cholesterol levels are high, you are over 50, or you are at high risk for heart disease, you may need your cholesterol levels checked more frequently.Ongoing high lipid and cholesterol levels should be treated with medicines if  diet and exercise are not working.  If you smoke, find out from your health care provider how to quit. If you do not use tobacco, do not start.  Lung cancer screening is recommended for adults aged 55-80 years who are at high risk for developing lung cancer because of a history of smoking. A yearly low-dose CT scan of the lungs is recommended for people who have at least a 30-pack-year history of smoking and are a current smoker or have quit within the past 15 years. A pack year of smoking is smoking an average of 1 pack of cigarettes a day for 1 year (for example: 1 pack a day for 30 years or 2 packs a day for 15 years). Yearly screening should continue until the smoker has  stopped smoking for at least 15 years. Yearly screening should be stopped for people who develop a health problem that would prevent them from having lung cancer treatment.  If you are pregnant, do not drink alcohol. If you are breastfeeding, be very cautious about drinking alcohol. If you are not pregnant and choose to drink alcohol, do not have more than 1 drink per day. One drink is considered to be 12 ounces (355 mL) of beer, 5 ounces (148 mL) of wine, or 1.5 ounces (44 mL) of liquor.  Avoid use of street drugs. Do not share needles with anyone. Ask for help if you need support or instructions about stopping the use of drugs.  High blood pressure causes heart disease and increases the risk of stroke. Your blood pressure should be checked at least every 1 to 2 years. Ongoing high blood pressure should be treated with medicines if weight loss and exercise do not work.  If you are 65-36 years old, ask your health care provider if you should take aspirin to prevent strokes.  Diabetes screening is done by taking a blood sample to check your blood glucose level after you have not eaten for a certain period of time (fasting). If you are not overweight and you do not have risk factors for diabetes, you should be screened once every 3  years starting at age 66. If you are overweight or obese and you are 39-74 years of age, you should be screened for diabetes every year as part of your cardiovascular risk assessment.  Breast cancer screening is essential preventive care for women. You should practice "breast self-awareness." This means understanding the normal appearance and feel of your breasts and may include breast self-examination. Any changes detected, no matter how small, should be reported to a health care provider. Women in their 67s and 30s should have a clinical breast exam (CBE) by a health care provider as part of a regular health exam every 1 to 3 years. After age 4, women should have a CBE every year. Starting at age 80, women should consider having a mammogram (breast X-ray test) every year. Women who have a family history of breast cancer should talk to their health care provider about genetic screening. Women at a high risk of breast cancer should talk to their health care providers about having an MRI and a mammogram every year.  Breast cancer gene (BRCA)-related cancer risk assessment is recommended for women who have family members with BRCA-related cancers. BRCA-related cancers include breast, ovarian, tubal, and peritoneal cancers. Having family members with these cancers may be associated with an increased risk for harmful changes (mutations) in the breast cancer genes BRCA1 and BRCA2. Results of the assessment will determine the need for genetic counseling and BRCA1 and BRCA2 testing.  Your health care provider may recommend that you be screened regularly for cancer of the pelvic organs (ovaries, uterus, and vagina). This screening involves a pelvic examination, including checking for microscopic changes to the surface of your cervix (Pap test). You may be encouraged to have this screening done every 3 years, beginning at age 109.  For women ages 44-65, health care providers may recommend pelvic exams and Pap testing  every 3 years, or they may recommend the Pap and pelvic exam, combined with testing for human papilloma virus (HPV), every 5 years. Some types of HPV increase your risk of cervical cancer. Testing for HPV may also be done on women of any age with unclear Pap test results.  Other health care providers may not recommend any screening for nonpregnant women who are considered low risk for pelvic cancer and who do not have symptoms. Ask your health care provider if a screening pelvic exam is right for you.  If you have had past treatment for cervical cancer or a condition that could lead to cancer, you need Pap tests and screening for cancer for at least 20 years after your treatment. If Pap tests have been discontinued, your risk factors (such as having a new sexual partner) need to be reassessed to determine if screening should resume. Some women have medical problems that increase the chance of getting cervical cancer. In these cases, your health care provider may recommend more frequent screening and Pap tests.  Colorectal cancer can be detected and often prevented. Most routine colorectal cancer screening begins at the age of 57 years and continues through age 52 years. However, your health care provider may recommend screening at an earlier age if you have risk factors for colon cancer. On a yearly basis, your health care provider may provide home test kits to check for hidden blood in the stool. Use of a small camera at the end of a tube, to directly examine the colon (sigmoidoscopy or colonoscopy), can detect the earliest forms of colorectal cancer. Talk to your health care provider about this at age 49, when routine screening begins. Direct exam of the colon should be repeated every 5-10 years through age 37 years, unless early forms of precancerous polyps or small growths are found.  People who are at an increased risk for hepatitis B should be screened for this virus. You are considered at high risk  for hepatitis B if:  You were born in a country where hepatitis B occurs often. Talk with your health care provider about which countries are considered high risk.  Your parents were born in a high-risk country and you have not received a shot to protect against hepatitis B (hepatitis B vaccine).  You have HIV or AIDS.  You use needles to inject street drugs.  You live with, or have sex with, someone who has hepatitis B.  You get hemodialysis treatment.  You take certain medicines for conditions like cancer, organ transplantation, and autoimmune conditions.  Hepatitis C blood testing is recommended for all people born from [birth year] through 1965 and any individual with known risks for hepatitis C.  Practice safe sex. Use condoms and avoid high-risk sexual practices to reduce the spread of sexually transmitted infections (STIs). STIs include gonorrhea, chlamydia, syphilis, trichomonas, herpes, HPV, and human immunodeficiency virus (HIV). Herpes, HIV, and HPV are viral illnesses that have no cure. They can result in disability, cancer, and death.  You should be screened for sexually transmitted illnesses (STIs) including gonorrhea and chlamydia if:  You are sexually active and are younger than 24 years.  You are older than 24 years and your health care provider tells you that you are at risk for this type of infection.  Your sexual activity has changed since you were last screened and you are at an increased risk for chlamydia or gonorrhea. Ask your health care provider if you are at risk.  If you are at risk of being infected with HIV, it is recommended that you take a prescription medicine daily to prevent HIV infection. This is called preexposure prophylaxis (PrEP). You are considered at risk if:  You are sexually active and do not regularly use condoms or know the HIV status of your  partner(s).  You take drugs by injection.  You are sexually active with a partner who has HIV.  Talk  with your health care provider about whether you are at high risk of being infected with HIV. If you choose to begin PrEP, you should first be tested for HIV. You should then be tested every 3 months for as long as you are taking PrEP.  Osteoporosis is a disease in which the bones lose minerals and strength with aging. This can result in serious bone fractures or breaks. The risk of osteoporosis can be identified using a bone density scan. Women ages 66 years and over and women at risk for fractures or osteoporosis should discuss screening with their health care providers. Ask your health care provider whether you should take a calcium supplement or vitamin D to reduce the rate of osteoporosis.  Menopause can be associated with physical symptoms and risks. Hormone replacement therapy is available to decrease symptoms and risks. You should talk to your health care provider about whether hormone replacement therapy is right for you.  Use sunscreen. Apply sunscreen liberally and repeatedly throughout the day. You should seek shade when your shadow is shorter than you. Protect yourself by wearing long sleeves, pants, a wide-brimmed hat, and sunglasses year round, whenever you are outdoors.  Once a month, do a whole body skin exam, using a mirror to look at the skin on your back. Tell your health care provider of new moles, moles that have irregular borders, moles that are larger than a pencil eraser, or moles that have changed in shape or color.  Stay current with required vaccines (immunizations).  Influenza vaccine. All adults should be immunized every year.  Tetanus, diphtheria, and acellular pertussis (Td, Tdap) vaccine. Pregnant women should receive 1 dose of Tdap vaccine during each pregnancy. The dose should be obtained regardless of the length of time since the last dose. Immunization is preferred during the 27th-36th week of gestation. An adult who has not previously received Tdap or who does not  know her vaccine status should receive 1 dose of Tdap. This initial dose should be followed by tetanus and diphtheria toxoids (Td) booster doses every 10 years. Adults with an unknown or incomplete history of completing a 3-dose immunization series with Td-containing vaccines should begin or complete a primary immunization series including a Tdap dose. Adults should receive a Td booster every 10 years.  Varicella vaccine. An adult without evidence of immunity to varicella should receive 2 doses or a second dose if she has previously received 1 dose. Pregnant females who do not have evidence of immunity should receive the first dose after pregnancy. This first dose should be obtained before leaving the health care facility. The second dose should be obtained 4-8 weeks after the first dose.  Human papillomavirus (HPV) vaccine. Females aged 13-26 years who have not received the vaccine previously should obtain the 3-dose series. The vaccine is not recommended for use in pregnant females. However, pregnancy testing is not needed before receiving a dose. If a female is found to be pregnant after receiving a dose, no treatment is needed. In that case, the remaining doses should be delayed until after the pregnancy. Immunization is recommended for any person with an immunocompromised condition through the age of 5 years if she did not get any or all doses earlier. During the 3-dose series, the second dose should be obtained 4-8 weeks after the first dose. The third dose should be obtained 24 weeks after  the first dose and 16 weeks after the second dose.  Zoster vaccine. One dose is recommended for adults aged 85 years or older unless certain conditions are present.  Measles, mumps, and rubella (MMR) vaccine. Adults born before [birth year] generally are considered immune to measles and mumps. Adults born in [birth year] or later should have 1 or more doses of MMR vaccine unless there is a contraindication to the vaccine or there  is laboratory evidence of immunity to each of the three diseases. A routine second dose of MMR vaccine should be obtained at least 28 days after the first dose for students attending postsecondary schools, health care workers, or international travelers. People who received inactivated measles vaccine or an unknown type of measles vaccine during 1963-1967 should receive 2 doses of MMR vaccine. People who received inactivated mumps vaccine or an unknown type of mumps vaccine before 1979 and are at high risk for mumps infection should consider immunization with 2 doses of MMR vaccine. For females of childbearing age, rubella immunity should be determined. If there is no evidence of immunity, females who are not pregnant should be vaccinated. If there is no evidence of immunity, females who are pregnant should delay immunization until after pregnancy. Unvaccinated health care workers born before 40 who lack laboratory evidence of measles, mumps, or rubella immunity or laboratory confirmation of disease should consider measles and mumps immunization with 2 doses of MMR vaccine or rubella immunization with 1 dose of MMR vaccine.  Pneumococcal 13-valent conjugate (PCV13) vaccine. When indicated, a person who is uncertain of his immunization history and has no record of immunization should receive the PCV13 vaccine. All adults 41 years of age and older should receive this vaccine. An adult aged 34 years or older who has certain medical conditions and has not been previously immunized should receive 1 dose of PCV13 vaccine. This PCV13 should be followed with a dose of pneumococcal polysaccharide (PPSV23) vaccine. Adults who are at high risk for pneumococcal disease should obtain the PPSV23 vaccine at least 8 weeks after the dose of PCV13 vaccine. Adults older than 64 years of age who have normal immune system function should obtain the PPSV23 vaccine dose at least 1 year after the dose of PCV13 vaccine.  Pneumococcal  polysaccharide (PPSV23) vaccine. When PCV13 is also indicated, PCV13 should be obtained first. All adults aged 40 years and older should be immunized. An adult younger than age 37 years who has certain medical conditions should be immunized. Any person who resides in a nursing home or long-term care facility should be immunized. An adult smoker should be immunized. People with an immunocompromised condition and certain other conditions should receive both PCV13 and PPSV23 vaccines. People with human immunodeficiency virus (HIV) infection should be immunized as soon as possible after diagnosis. Immunization during chemotherapy or radiation therapy should be avoided. Routine use of PPSV23 vaccine is not recommended for American Indians, [place] Natives, or people younger than 65 years unless there are medical conditions that require PPSV23 vaccine. When indicated, people who have unknown immunization and have no record of immunization should receive PPSV23 vaccine. One-time revaccination 5 years after the first dose of PPSV23 is recommended for people aged 19-64 years who have chronic kidney failure, nephrotic syndrome, asplenia, or immunocompromised conditions. People who received 1-2 doses of PPSV23 before age 43 years should receive another dose of PPSV23 vaccine at age 90 years or later if at least 5 years have passed since the previous dose. Doses of PPSV23 are not  needed for people immunized with PPSV23 at or after age 10 years.  Meningococcal vaccine. Adults with asplenia or persistent complement component deficiencies should receive 2 doses of quadrivalent meningococcal conjugate (MenACWY-D) vaccine. The doses should be obtained at least 2 months apart. Microbiologists working with certain meningococcal bacteria, [employer] recruits, people at risk during an outbreak, and people who travel to or live in countries with a high rate of meningitis should be immunized. A first-year college student up through age 16  years who is living in a residence hall should receive a dose if she did not receive a dose on or after her 16th birthday. Adults who have certain high-risk conditions should receive one or more doses of vaccine.  Hepatitis A vaccine. Adults who wish to be protected from this disease, have certain high-risk conditions, work with hepatitis A-infected animals, work in hepatitis A research labs, or travel to or work in countries with a high rate of hepatitis A should be immunized. Adults who were previously unvaccinated and who anticipate close contact with an international adoptee during the first 60 days after arrival in the [place] from a country with a high rate of hepatitis A should be immunized.  Hepatitis B vaccine. Adults who wish to be protected from this disease, have certain high-risk conditions, may be exposed to blood or other infectious body fluids, are household contacts or sex partners of hepatitis B positive people, are clients or workers in certain care facilities, or travel to or work in countries with a high rate of hepatitis B should be immunized.  Haemophilus influenzae type b (Hib) vaccine. A previously unvaccinated person with asplenia or sickle cell disease or having a scheduled splenectomy should receive 1 dose of Hib vaccine. Regardless of previous immunization, a recipient of a hematopoietic stem cell transplant should receive a 3-dose series 6-12 months after her successful transplant. Hib vaccine is not recommended for adults with HIV infection. Preventive Services / Frequency Ages 76 to 31 years  Blood pressure check.** / Every 3-5 years.  Lipid and cholesterol check.** / Every 5 years beginning at age 10.  Clinical breast exam.** / Every 3 years for women in their 12s and 72s.  BRCA-related cancer risk assessment.** / For women who have family members with a BRCA-related cancer (breast, ovarian, tubal, or peritoneal cancers).  Pap test.** / Every 2 years from ages  9 through 40. Every 3 years starting at age 70 through age 59 or 76 with a history of 3 consecutive normal Pap tests.  HPV screening.** / Every 3 years from ages 12 through ages 54 to 4 with a history of 3 consecutive normal Pap tests.  Hepatitis C blood test.** / For any individual with known risks for hepatitis C.  Skin self-exam. / Monthly.  Influenza vaccine. / Every year.  Tetanus, diphtheria, and acellular pertussis (Tdap, Td) vaccine.** / Consult your health care provider. Pregnant women should receive 1 dose of Tdap vaccine during each pregnancy. 1 dose of Td every 10 years.  Varicella vaccine.** / Consult your health care provider. Pregnant females who do not have evidence of immunity should receive the first dose after pregnancy.  HPV vaccine. / 3 doses over 6 months, if 56 and younger. The vaccine is not recommended for use in pregnant females. However, pregnancy testing is not needed before receiving a dose.  Measles, mumps, rubella (MMR) vaccine.** / You need at least 1 dose of MMR if you were born in 1957 or later. You may also  need a 2nd dose. For females of childbearing age, rubella immunity should be determined. If there is no evidence of immunity, females who are not pregnant should be vaccinated. If there is no evidence of immunity, females who are pregnant should delay immunization until after pregnancy.  Pneumococcal 13-valent conjugate (PCV13) vaccine.** / Consult your health care provider.  Pneumococcal polysaccharide (PPSV23) vaccine.** / 1 to 2 doses if you smoke cigarettes or if you have certain conditions.  Meningococcal vaccine.** / 1 dose if you are age 31 to 67 years and a Market researcher living in a residence hall, or have one of several medical conditions, you need to get vaccinated against meningococcal disease. You may also need additional booster doses.  Hepatitis A vaccine.** / Consult your health care provider.  Hepatitis B vaccine.** /  Consult your health care provider.  Haemophilus influenzae type b (Hib) vaccine.** / Consult your health care provider. Ages 14 to 34 years  Blood pressure check.** / Every year.  Lipid and cholesterol check.** / Every 5 years beginning at age 73 years.  Lung cancer screening. / Every year if you are aged 2-80 years and have a 30-pack-year history of smoking and currently smoke or have quit within the past 15 years. Yearly screening is stopped once you have quit smoking for at least 15 years or develop a health problem that would prevent you from having lung cancer treatment.  Clinical breast exam.** / Every year after age 73 years.  BRCA-related cancer risk assessment.** / For women who have family members with a BRCA-related cancer (breast, ovarian, tubal, or peritoneal cancers).  Mammogram.** / Every year beginning at age 99 years and continuing for as long as you are in good health. Consult with your health care provider.  Pap test.** / Every 3 years starting at age 26 years through age 71 or 27 years with a history of 3 consecutive normal Pap tests.  HPV screening.** / Every 3 years from ages 62 years through ages 68 to 15 years with a history of 3 consecutive normal Pap tests.  Fecal occult blood test (FOBT) of stool. / Every year beginning at age 34 years and continuing until age 49 years. You may not need to do this test if you get a colonoscopy every 10 years.  Flexible sigmoidoscopy or colonoscopy.** / Every 5 years for a flexible sigmoidoscopy or every 10 years for a colonoscopy beginning at age 14 years and continuing until age 32 years.  Hepatitis C blood test.** / For all people born from [birth year] through 1964 and any individual with known risks for hepatitis C.  Skin self-exam. / Monthly.  Influenza vaccine. / Every year.  Tetanus, diphtheria, and acellular pertussis (Tdap/Td) vaccine.** / Consult your health care provider. Pregnant women should receive 1 dose of Tdap  vaccine during each pregnancy. 1 dose of Td every 10 years.  Varicella vaccine.** / Consult your health care provider. Pregnant females who do not have evidence of immunity should receive the first dose after pregnancy.  Zoster vaccine.** / 1 dose for adults aged 49 years or older.  Measles, mumps, rubella (MMR) vaccine.** / You need at least 1 dose of MMR if you were born in 1957 or later. You may also need a second dose. For females of childbearing age, rubella immunity should be determined. If there is no evidence of immunity, females who are not pregnant should be vaccinated. If there is no evidence of immunity, females who are pregnant should delay immunization  until after pregnancy.  Pneumococcal 13-valent conjugate (PCV13) vaccine.** / Consult your health care provider.  Pneumococcal polysaccharide (PPSV23) vaccine.** / 1 to 2 doses if you smoke cigarettes or if you have certain conditions.  Meningococcal vaccine.** / Consult your health care provider.  Hepatitis A vaccine.** / Consult your health care provider.  Hepatitis B vaccine.** / Consult your health care provider.  Haemophilus influenzae type b (Hib) vaccine.** / Consult your health care provider. Ages 65 years and over  Blood pressure check.** / Every year.  Lipid and cholesterol check.** / Every 5 years beginning at age 106 years.  Lung cancer screening. / Every year if you are aged 55-80 years and have a 30-pack-year history of smoking and currently smoke or have quit within the past 15 years. Yearly screening is stopped once you have quit smoking for at least 15 years or develop a health problem that would prevent you from having lung cancer treatment.  Clinical breast exam.** / Every year after age 23 years.  BRCA-related cancer risk assessment.** / For women who have family members with a BRCA-related cancer (breast, ovarian, tubal, or peritoneal cancers).  Mammogram.** / Every year beginning at age 82 years and  continuing for as long as you are in good health. Consult with your health care provider.  Pap test.** / Every 3 years starting at age 5 years through age 45 or 53 years with 3 consecutive normal Pap tests. Testing can be stopped between 65 and 70 years with 3 consecutive normal Pap tests and no abnormal Pap or HPV tests in the past 10 years.  HPV screening.** / Every 3 years from ages 45 years through ages 85 or 38 years with a history of 3 consecutive normal Pap tests. Testing can be stopped between 65 and 70 years with 3 consecutive normal Pap tests and no abnormal Pap or HPV tests in the past 10 years.  Fecal occult blood test (FOBT) of stool. / Every year beginning at age 49 years and continuing until age 50 years. You may not need to do this test if you get a colonoscopy every 10 years.  Flexible sigmoidoscopy or colonoscopy.** / Every 5 years for a flexible sigmoidoscopy or every 10 years for a colonoscopy beginning at age 6 years and continuing until age 29 years.  Hepatitis C blood test.** / For all people born from [birth year] through 1964 and any individual with known risks for hepatitis C.  Osteoporosis screening.** / A one-time screening for women ages 18 years and over and women at risk for fractures or osteoporosis.  Skin self-exam. / Monthly.  Influenza vaccine. / Every year.  Tetanus, diphtheria, and acellular pertussis (Tdap/Td) vaccine.** / 1 dose of Td every 10 years.  Varicella vaccine.** / Consult your health care provider.  Zoster vaccine.** / 1 dose for adults aged 62 years or older.  Pneumococcal 13-valent conjugate (PCV13) vaccine.** / Consult your health care provider.  Pneumococcal polysaccharide (PPSV23) vaccine.** / 1 dose for all adults aged 66 years and older.  Meningococcal vaccine.** / Consult your health care provider.  Hepatitis A vaccine.** / Consult your health care provider.  Hepatitis B vaccine.** / Consult your health care provider.  Haemophilus  influenzae type b (Hib) vaccine.** / Consult your health care provider. ** Family history and personal history of risk and conditions may change your health care provider's recommendations.   This information is not intended to replace advice given to you by your health care provider. Make sure you discuss  any questions you have with your health care provider.   Document Released: 02/28/2001 Document Revised: 01/23/2014 Document Reviewed: 05/30/2010 Elsevier Interactive Patient Education [employer].

## 2015-03-16 NOTE — Progress Notes (Signed)
Pre visit review using our clinic review tool, if applicable. No additional management support is needed unless otherwise documented below in the visit note. 

## 2015-06-15 ENCOUNTER — Ambulatory Visit: Payer: BLUE CROSS/BLUE SHIELD | Admitting: Family Medicine

## 2015-06-28 ENCOUNTER — Ambulatory Visit (INDEPENDENT_AMBULATORY_CARE_PROVIDER_SITE_OTHER): Payer: BLUE CROSS/BLUE SHIELD | Admitting: Family Medicine

## 2015-06-28 ENCOUNTER — Encounter: Payer: Self-pay | Admitting: Family Medicine

## 2015-06-28 VITALS — BP 140/84 | HR 79 | Temp 98.0°F | Wt 134.2 lb

## 2015-06-28 DIAGNOSIS — M8949 Other hypertrophic osteoarthropathy, multiple sites: Secondary | ICD-10-CM

## 2015-06-28 DIAGNOSIS — I10 Essential (primary) hypertension: Secondary | ICD-10-CM | POA: Diagnosis not present

## 2015-06-28 DIAGNOSIS — E785 Hyperlipidemia, unspecified: Secondary | ICD-10-CM | POA: Diagnosis not present

## 2015-06-28 DIAGNOSIS — E1151 Type 2 diabetes mellitus with diabetic peripheral angiopathy without gangrene: Secondary | ICD-10-CM

## 2015-06-28 DIAGNOSIS — E1165 Type 2 diabetes mellitus with hyperglycemia: Secondary | ICD-10-CM | POA: Diagnosis not present

## 2015-06-28 DIAGNOSIS — M150 Primary generalized (osteo)arthritis: Secondary | ICD-10-CM

## 2015-06-28 DIAGNOSIS — IMO0002 Reserved for concepts with insufficient information to code with codable children: Secondary | ICD-10-CM

## 2015-06-28 DIAGNOSIS — M159 Polyosteoarthritis, unspecified: Secondary | ICD-10-CM

## 2015-06-28 DIAGNOSIS — I25700 Atherosclerosis of coronary artery bypass graft(s), unspecified, with unstable angina pectoris: Secondary | ICD-10-CM

## 2015-06-28 LAB — COMPREHENSIVE METABOLIC PANEL
ALBUMIN: 4.4 g/dL (ref 3.5–5.2)
ALK PHOS: 35 U/L — AB (ref 39–117)
ALT: 39 U/L — AB (ref 0–35)
AST: 38 U/L — AB (ref 0–37)
BILIRUBIN TOTAL: 0.5 mg/dL (ref 0.2–1.2)
BUN: 16 mg/dL (ref 6–23)
CALCIUM: 10 mg/dL (ref 8.4–10.5)
CHLORIDE: 104 meq/L (ref 96–112)
CO2: 29 mEq/L (ref 19–32)
CREATININE: 0.82 mg/dL (ref 0.40–1.20)
GFR: 74.66 mL/min (ref >60.00)
Glucose, Bld: 90 mg/dL (ref 70–99)
Potassium: 3.7 mEq/L (ref 3.5–5.1)
SODIUM: 139 meq/L (ref 135–145)
TOTAL PROTEIN: 8 g/dL (ref 6.0–8.3)

## 2015-06-28 LAB — LIPID PANEL
CHOLESTEROL: 200 mg/dL (ref 0–200)
HDL: 41.3 mg/dL (ref >39.00)
LDL Cholesterol: 121 mg/dL — ABNORMAL HIGH (ref 0–99)
NONHDL: 158.89
Total CHOL/HDL Ratio: 5
Triglycerides: 187 mg/dL — ABNORMAL HIGH (ref 0.0–149.0)
VLDL: 37.4 mg/dL (ref 0.0–40.0)

## 2015-06-28 LAB — HEMOGLOBIN A1C: HEMOGLOBIN A1C: 6.3 % (ref 4.6–6.5)

## 2015-06-28 MED ORDER — FENOFIBRATE MICRONIZED 130 MG PO CAPS: 130.0000 mg | ORAL_CAPSULE | Freq: Every day | ORAL | Status: DC

## 2015-06-28 MED ORDER — NITROGLYCERIN 0.4 MG SL SUBL: 0.4000 mg | SUBLINGUAL_TABLET | SUBLINGUAL | Status: DC | PRN

## 2015-06-28 MED ORDER — CHLORTHALIDONE 25 MG PO TABS: 25.0000 mg | ORAL_TABLET | Freq: Every day | ORAL | Status: DC

## 2015-06-28 MED ORDER — METOPROLOL TARTRATE 25 MG PO TABS: 25.0000 mg | ORAL_TABLET | Freq: Two times a day (BID) | ORAL | Status: DC

## 2015-06-28 MED ORDER — GLUCOSE BLOOD VI STRP: ORAL_STRIP | Status: DC

## 2015-06-28 MED ORDER — LOSARTAN POTASSIUM 50 MG PO TABS: 50.0000 mg | ORAL_TABLET | Freq: Every day | ORAL | Status: DC

## 2015-06-28 MED ORDER — ATORVASTATIN CALCIUM 20 MG PO TABS: 20.0000 mg | ORAL_TABLET | Freq: Every day | ORAL | Status: DC

## 2015-06-28 MED ORDER — SAXAGLIPTIN-METFORMIN ER 5-1000 MG PO TB24: 1.0000 | ORAL_TABLET | Freq: Every day | ORAL | Status: DC

## 2015-06-28 MED ORDER — MELOXICAM 15 MG PO TABS: 15.0000 mg | ORAL_TABLET | Freq: Every day | ORAL | Status: DC

## 2015-06-28 NOTE — Patient Instructions (Signed)

## 2015-06-28 NOTE — Progress Notes (Signed)
Pre visit review using our clinic review tool, if applicable. No additional management support is needed unless otherwise documented below in the visit note. 

## 2015-06-28 NOTE — Progress Notes (Signed)
Patient ID: Dominique Hayes, female    DOB: Feb 21, 1950  Age: 65 y.o. MRN: [id number]    Subjective:  Subjective HPI Dominique Hayes presents for f/u dm, bp and cholesterol.    Pt also c/o some poison ivy on her hand and legs-- she is using elocon and it helps.     HYPERTENSION  Blood pressure range-131/75  Chest pain- no      Dyspnea- no Lightheadedness- no   Edema- no Other side effects - no   Medication compliance: good Low salt diet- yes  DIABETES  Blood Sugar ranges-94-104   Polyuria- no New Visual problems- no Hypoglycemic symptoms- no Other side effects-no Medication compliance - good Last eye exam- last year  Foot exam- today  HYPERLIPIDEMIA  Medication compliance- good RUQ pain- no  Muscle aches- no Other side effects-no   Review of Systems  Constitutional: Negative for diaphoresis, appetite change, fatigue and unexpected weight change.  Eyes: Negative for pain, redness and visual disturbance.  Respiratory: Negative for cough, chest tightness, shortness of breath and wheezing.   Cardiovascular: Negative for chest pain, palpitations and leg swelling.  Endocrine: Negative for cold intolerance, heat intolerance, polydipsia, polyphagia and polyuria.  Genitourinary: Negative for dysuria, frequency and difficulty urinating.  Neurological: Negative for dizziness, light-headedness, numbness and headaches.      History Past Medical History  Diagnosis Date  . Hypertension   . Hyperlipidemia   . DM (diabetes mellitus) (HCC)   . Hepatitis B   . Mild diastolic dysfunction     She has past surgical history that includes Abdominal hysterectomy.   Her family history includes Hypertension in her father, mother, sister, and sister.She reports that she has never smoked. She has never used smokeless tobacco. She reports that she does not drink alcohol or use illicit drugs.  Current Outpatient Prescriptions on File Prior to Visit  Medication Sig Dispense Refill  . [facility] ASPIRIN  LOW DOSE 81 MG EC tablet TAKE 1 TABLET BY MOUTH DAILY. 30 tablet 3  . Lancets (FREESTYLE) lancets Check blood sugar daily Dx: 250.00 (Freestyle Lite Meter) 100 each 12  . mometasone (ELOCON) 0.1 % cream Apply 1 application topically daily. 135 g 1  . nystatin cream (MYCOSTATIN) Apply 1 application topically 2 (two) times daily. 30 g 0   No current facility-administered medications on file prior to visit.     Objective:  Objective Physical Exam  Constitutional: She is oriented to person, place, and time. She appears well-developed and well-nourished.  HENT:  Head: Normocephalic and atraumatic.  Eyes: Conjunctivae and EOM are normal.  Neck: Normal range of motion. Neck supple. No JVD present. Carotid bruit is not present. No thyromegaly present.  Cardiovascular: Normal rate, regular rhythm and normal heart sounds.   No murmur heard. Pulmonary/Chest: Effort normal and breath sounds normal. No respiratory distress. She has no wheezes. She has no rales. She exhibits no tenderness.  Musculoskeletal: She exhibits no edema.  Neurological: She is alert and oriented to person, place, and time.  Skin: Rash noted. Rash is papular and vesicular.  Psychiatric: She has a normal mood and affect.  Nursing note and vitals reviewed.  BP 140/84 mmHg  Pulse 79  Temp(Src) 98 F (36.7 C) (Oral)  Wt 134 lb 3.2 oz (60.873 kg)  SpO2 98% Wt Readings from Last 3 Encounters:  06/28/15 134 lb 3.2 oz (60.873 kg)  03/16/15 131 lb 6.4 oz (59.603 kg)  10/16/14 129 lb 6.4 oz (58.695 kg)     Lab  Results  Component Value Date   WBC 8.0 05/26/2013   HGB 14.3 05/26/2013   HCT 41.9 05/26/2013   PLT 285.0 05/26/2013   GLUCOSE 90 06/28/2015   CHOL 200 06/28/2015   TRIG 187.0* 06/28/2015   HDL 41.30 06/28/2015   LDLDIRECT 76.0 03/13/2014   LDLCALC 121* 06/28/2015   ALT 39* 06/28/2015   AST 38* 06/28/2015   NA 139 06/28/2015   K 3.7 06/28/2015   CL 104 06/28/2015   CREATININE 0.82 06/28/2015   BUN 16  06/28/2015   CO2 29 06/28/2015   TSH 1.05 05/26/2013   HGBA1C 6.3 06/28/2015   MICROALBUR <0.7 10/16/2014    US Abdomen Complete  05/26/2014  CLINICAL DATA:  Hepatitis-B EXAM: ULTRASOUND ABDOMEN COMPLETE COMPARISON:  None. FINDINGS: Gallbladder: Multiple layering gallstones are noted within gallbladder the largest measures 6 mm. No thickening of gallbladder wall. No sonographic Murphy's sign. Common bile duct: Diameter: 5.8 mm in diameter. Liver: No focal lesion identified. Within normal limits in parenchymal echogenicity. No intrahepatic biliary ductal dilatation. IVC: No abnormality visualized. Pancreas: Visualized portion unremarkable. Spleen: Size and appearance within normal limits. Measures 6.2 cm in length Right Kidney: Length: 10.2 cm. Echogenicity within normal limits. No mass or hydronephrosis visualized. Left Kidney: Length: 10.5 cm. Echogenicity within normal limits. No mass or hydronephrosis visualized. Abdominal aorta: No aneurysm visualized. Measures up to 2.3 cm in diameter. Other findings: None. IMPRESSION: 1. Multiple gallstones are noted within gallbladder. 2. No sonographic Murphy's sign.  Normal CBD. 3. No hydronephrosis. 4. No aortic aneurysm. 5. No splenomegaly. Electronically Signed   By: Natasha Mead M.D.   On: 05/26/2014 09:35     Assessment & Plan:  Plan I am having Ms. Keeling maintain her [facility] ASPIRIN LOW DOSE, nystatin cream, freestyle, mometasone, atorvastatin, chlorthalidone, fenofibrate micronized, losartan, meloxicam, Saxagliptin-Metformin, nitroGLYCERIN, metoprolol tartrate, and glucose blood.  Meds ordered this encounter  Medications  . atorvastatin (LIPITOR) 20 MG tablet    Sig: Take 1 tablet (20 mg total) by mouth daily.    Dispense:  90 tablet    Refill:  3  . chlorthalidone (HYGROTON) 25 MG tablet    Sig: Take 1 tablet (25 mg total) by mouth daily.    Dispense:  90 tablet    Refill:  3  . fenofibrate micronized (ANTARA) 130 MG capsule    Sig: Take 1 capsule  (130 mg total) by mouth daily before breakfast.    Dispense:  270 capsule    Refill:  3  . losartan (COZAAR) 50 MG tablet    Sig: Take 1 tablet (50 mg total) by mouth daily.    Dispense:  90 tablet    Refill:  3  . meloxicam (MOBIC) 15 MG tablet    Sig: Take 1 tablet (15 mg total) by mouth daily. For hand pain; [place]ese label if available    Dispense:  90 tablet    Refill:  1  . Saxagliptin-Metformin (KOMBIGLYZE XR) 05-998 MG TB24    Sig: Take 1 tablet by mouth daily.    Dispense:  90 tablet    Refill:  1  . nitroGLYCERIN (NITROSTAT) 0.4 MG SL tablet    Sig: Place 1 tablet (0.4 mg total) under the tongue every 5 (five) minutes as needed for chest pain.    Dispense:  30 tablet    Refill:  0  . metoprolol tartrate (LOPRESSOR) 25 MG tablet    Sig: Take 1 tablet (25 mg total) by mouth 2 (two) times daily.  Dispense:  180 tablet    Refill:  1  . glucose blood (FREESTYLE LITE) test strip    Sig: Check blood sugar once daily Dx:250.00 (Freestyle Lite Meter)    Dispense:  100 each    Refill:  12    Problem List Items Addressed This Visit    Essential hypertension   Relevant Medications   atorvastatin (LIPITOR) 20 MG tablet   chlorthalidone (HYGROTON) 25 MG tablet   fenofibrate micronized (ANTARA) 130 MG capsule   losartan (COZAAR) 50 MG tablet   nitroGLYCERIN (NITROSTAT) 0.4 MG SL tablet   metoprolol tartrate (LOPRESSOR) 25 MG tablet   Other Relevant Orders   Comprehensive metabolic panel (Completed)   Hemoglobin A1c (Completed)   Lipid panel (Completed)   POCT urinalysis dipstick    Other Visit Diagnoses    DM (diabetes mellitus) type II uncontrolled, periph vascular disorder (HCC)    -  Primary    Relevant Medications    atorvastatin (LIPITOR) 20 MG tablet    chlorthalidone (HYGROTON) 25 MG tablet    fenofibrate micronized (ANTARA) 130 MG capsule    losartan (COZAAR) 50 MG tablet    Saxagliptin-Metformin (KOMBIGLYZE XR) 05-998 MG TB24    nitroGLYCERIN (NITROSTAT) 0.4  MG SL tablet    metoprolol tartrate (LOPRESSOR) 25 MG tablet    glucose blood (FREESTYLE LITE) test strip    Other Relevant Orders    Comprehensive metabolic panel (Completed)    Hemoglobin A1c (Completed)    Lipid panel (Completed)    POCT urinalysis dipstick    Ambulatory referral to diabetic education    Hyperlipidemia        Relevant Medications    atorvastatin (LIPITOR) 20 MG tablet    chlorthalidone (HYGROTON) 25 MG tablet    fenofibrate micronized (ANTARA) 130 MG capsule    losartan (COZAAR) 50 MG tablet    nitroGLYCERIN (NITROSTAT) 0.4 MG SL tablet    metoprolol tartrate (LOPRESSOR) 25 MG tablet    Other Relevant Orders    Comprehensive metabolic panel (Completed)    Hemoglobin A1c (Completed)    Lipid panel (Completed)    POCT urinalysis dipstick    Hyperlipidemia LDL goal <100        Relevant Medications    atorvastatin (LIPITOR) 20 MG tablet    chlorthalidone (HYGROTON) 25 MG tablet    fenofibrate micronized (ANTARA) 130 MG capsule    losartan (COZAAR) 50 MG tablet    nitroGLYCERIN (NITROSTAT) 0.4 MG SL tablet    metoprolol tartrate (LOPRESSOR) 25 MG tablet    Primary osteoarthritis involving multiple joints        Relevant Medications    meloxicam (MOBIC) 15 MG tablet    Coronary artery disease involving coronary bypass graft of native heart with unstable angina pectoris (HCC)        Relevant Medications    atorvastatin (LIPITOR) 20 MG tablet    chlorthalidone (HYGROTON) 25 MG tablet    fenofibrate micronized (ANTARA) 130 MG capsule    losartan (COZAAR) 50 MG tablet    nitroGLYCERIN (NITROSTAT) 0.4 MG SL tablet    metoprolol tartrate (LOPRESSOR) 25 MG tablet       Follow-up: Return in about 6 months (around 12/28/2015), or if symptoms worsen or fail to improve, for hypertension, hyperlipidemia, diabetes II.  Donato SchultzYvonne R Lowne Chase, DO

## 2015-06-28 NOTE — Assessment & Plan Note (Signed)
Pt noncompliant with diet-- gave some ed material in office and refer to diabetic educator con't meds Check labs

## 2015-07-13 ENCOUNTER — Telehealth: Payer: Self-pay | Admitting: *Deleted

## 2015-07-13 MED ORDER — ATORVASTATIN CALCIUM 40 MG PO TABS
40.0000 mg | ORAL_TABLET | Freq: Every day | ORAL | Status: DC
Start: 2015-07-13 — End: 2015-10-04

## 2015-07-13 NOTE — Telephone Encounter (Signed)
Called and spoke with the pt's husband and informed him of the pt's recent lab results and note.  He verbalized understanding.  New prescription was sent to the pharmacy by e-script.   Pt is already scheduled to see Dr. Laury AxonLowne in 3 months for follow-up on (10/04/15).//AB/CMA

## 2015-07-13 NOTE — Telephone Encounter (Signed)
-----   Message from Donato SchultzYvonne R Lowne Chase, DO sent at 07/07/2015 10:37 AM EDT ----- Liver function is better Inc lipitor to 40 mg and con't fenofibrate Repeat in 3 months--- lipid, cmp, acute hep panal ,  Dm controlled---- also add hgba1c

## 2015-07-15 ENCOUNTER — Encounter: Payer: Self-pay | Admitting: Family Medicine

## 2015-08-03 ENCOUNTER — Encounter: Payer: BLUE CROSS/BLUE SHIELD | Attending: Family Medicine | Admitting: Dietician

## 2015-08-03 ENCOUNTER — Encounter: Payer: Self-pay | Admitting: Dietician

## 2015-08-03 VITALS — Ht 61.0 in | Wt 132.0 lb

## 2015-08-03 DIAGNOSIS — Z713 Dietary counseling and surveillance: Secondary | ICD-10-CM | POA: Diagnosis not present

## 2015-08-03 DIAGNOSIS — E1165 Type 2 diabetes mellitus with hyperglycemia: Secondary | ICD-10-CM | POA: Diagnosis not present

## 2015-08-03 DIAGNOSIS — E1151 Type 2 diabetes mellitus with diabetic peripheral angiopathy without gangrene: Secondary | ICD-10-CM | POA: Diagnosis present

## 2015-08-03 DIAGNOSIS — E119 Type 2 diabetes mellitus without complications: Secondary | ICD-10-CM

## 2015-08-03 NOTE — Progress Notes (Signed)
  Medical Nutrition Therapy:  Appt start time: 0945 end time:  1200.   Assessment:  Primary concerns today: Patient is here today with her husband.  They have been referred by their doctor to better control blood sugar to help reduced the need of medication.  History includes Type 2 Diabetes diagnosed in 2007 when she arrived in the [place], hyperlipidemia and HTN.  Her weight has been stable and is 132 lbs today.  Used an interpretor from [employer]([phone]) but felt there still was a little language barrier at times.  Her last HgbA1C was 6.3% which was down from 7.3% 03/13/14.  Patient [place] husband, [place]  She cooks for herself and her husband.  Her son and wife eat out.  She is a vegetarian for more than 40 years ([employer] and does not want to kill animals).  She has a college education and was Publishing copystudying science and wanted to go on to [employer] but was not able due to poor finances after the war.    Preferred Learning Style:   No preference indicated   Learning Readiness:   Ready  MEDICATIONS: see list to include Saxagliptin-Metformin   DIETARY INTAKE:  Usual eating pattern includes 3 meals and 2 snacks per day.  Everyday foods include Red rice powder mixed with water every am to make her feel full.  Avoided foods include rare fried, no eggs.    24-hr recall:  B (9 AM): white bread with cheese, milk, yogurt, occasional soy milk Snk ( AM): banana or yam  L (1 PM): 1 cup rice with boiled vegetables and green vegetables or salad, with occasional tofu Snk ( PM): apple D (7 PM):  2/3 cup rice with boiled vegetables and green vegetables or salad Snk ( PM):  none Beverages: water  Usual physical activity: exercises two times every day (gardening or walking and lite weights).  Estimated energy needs: 1600 calories 180 g carbohydrates 120 g protein 44 g fat  Progress Towards Goal(s):  In progress.   Nutritional Diagnosis:  NB-1.1 Food and  nutrition-related knowledge deficit As related to balance of carbohydrates, protein, and fat.  As evidenced by diet hx.    Intervention:  Nutrition education regarding a healthy vegetarian diet for diabetes.  Recommended patient change to [employer] bread and brown rice.  Culturally they are not used to it but are willing to try.  Discussed avoiding added sugar and the benefit of increased activity.  Discussed use of green soybeans and tofu as good protein sources to include more often.  Teaching Method Utilized:  Visual Auditory Hands on  Handouts given during visit include: (mailed)  Diabetic diet in Viet[place]  Barriers to learning/adherence to lifestyle change: none  Demonstrated degree of understanding via:  Teach Back   Monitoring/Evaluation:  Dietary intake, exercise, and body weight prn.

## 2015-10-04 ENCOUNTER — Ambulatory Visit (INDEPENDENT_AMBULATORY_CARE_PROVIDER_SITE_OTHER): Payer: BLUE CROSS/BLUE SHIELD | Admitting: Family Medicine

## 2015-10-04 ENCOUNTER — Encounter: Payer: Self-pay | Admitting: Family Medicine

## 2015-10-04 VITALS — BP 120/72 | HR 80 | Temp 97.8°F | Ht 61.0 in | Wt 129.0 lb

## 2015-10-04 DIAGNOSIS — Z23 Encounter for immunization: Secondary | ICD-10-CM | POA: Diagnosis not present

## 2015-10-04 DIAGNOSIS — I10 Essential (primary) hypertension: Secondary | ICD-10-CM | POA: Diagnosis not present

## 2015-10-04 DIAGNOSIS — I25700 Atherosclerosis of coronary artery bypass graft(s), unspecified, with unstable angina pectoris: Secondary | ICD-10-CM | POA: Diagnosis not present

## 2015-10-04 DIAGNOSIS — E785 Hyperlipidemia, unspecified: Secondary | ICD-10-CM | POA: Diagnosis not present

## 2015-10-04 DIAGNOSIS — IMO0002 Reserved for concepts with insufficient information to code with codable children: Secondary | ICD-10-CM

## 2015-10-04 DIAGNOSIS — E1165 Type 2 diabetes mellitus with hyperglycemia: Secondary | ICD-10-CM | POA: Diagnosis not present

## 2015-10-04 DIAGNOSIS — E1151 Type 2 diabetes mellitus with diabetic peripheral angiopathy without gangrene: Secondary | ICD-10-CM

## 2015-10-04 LAB — COMPREHENSIVE METABOLIC PANEL
ALT: 37 U/L — AB (ref 0–35)
AST: 35 U/L (ref 0–37)
Albumin: 4 g/dL (ref 3.5–5.2)
Alkaline Phosphatase: 41 U/L (ref 39–117)
BUN: 23 mg/dL (ref 6–23)
CHLORIDE: 104 meq/L (ref 96–112)
CO2: 32 meq/L (ref 19–32)
Calcium: 9.5 mg/dL (ref 8.4–10.5)
Creatinine, Ser: 1 mg/dL (ref 0.40–1.20)
GFR: 59.33 mL/min — AB (ref >60.00)
GLUCOSE: 100 mg/dL — AB (ref 70–99)
POTASSIUM: 3.9 meq/L (ref 3.5–5.1)
Sodium: 141 mEq/L (ref 135–145)
Total Bilirubin: 0.7 mg/dL (ref 0.2–1.2)
Total Protein: 7.6 g/dL (ref 6.0–8.3)

## 2015-10-04 LAB — LIPID PANEL
CHOL/HDL RATIO: 4
Cholesterol: 135 mg/dL (ref 0–200)
HDL: 36.7 mg/dL — ABNORMAL LOW (ref >39.00)
LDL CALC: 71 mg/dL (ref 0–99)
NONHDL: 98.78
Triglycerides: 141 mg/dL (ref 0.0–149.0)
VLDL: 28.2 mg/dL (ref 0.0–40.0)

## 2015-10-04 LAB — HEMOGLOBIN A1C: HEMOGLOBIN A1C: 6.1 % (ref 4.6–6.5)

## 2015-10-04 MED ORDER — CHLORTHALIDONE 25 MG PO TABS: 25.0000 mg | ORAL_TABLET | Freq: Every day | ORAL | 3 refills | Status: DC

## 2015-10-04 MED ORDER — NITROGLYCERIN 0.4 MG SL SUBL
0.4000 mg | SUBLINGUAL_TABLET | SUBLINGUAL | 0 refills | Status: DC | PRN
Start: 2015-10-04 — End: 2016-04-05

## 2015-10-04 MED ORDER — SAXAGLIPTIN-METFORMIN ER 5-1000 MG PO TB24: 1.0000 | ORAL_TABLET | Freq: Every day | ORAL | 1 refills | Status: DC

## 2015-10-04 MED ORDER — LOSARTAN POTASSIUM 50 MG PO TABS
50.0000 mg | ORAL_TABLET | Freq: Every day | ORAL | 3 refills | Status: DC
Start: 2015-10-04 — End: 2016-04-05

## 2015-10-04 MED ORDER — ATORVASTATIN CALCIUM 40 MG PO TABS: 40.0000 mg | ORAL_TABLET | Freq: Every day | ORAL | 0 refills | Status: DC

## 2015-10-04 MED ORDER — GLUCOSE BLOOD VI STRP: ORAL_STRIP | 12 refills | Status: DC

## 2015-10-04 MED ORDER — METOPROLOL TARTRATE 25 MG PO TABS: 25.0000 mg | ORAL_TABLET | Freq: Two times a day (BID) | ORAL | 1 refills | Status: DC

## 2015-10-04 NOTE — Progress Notes (Signed)
Pre visit review using our clinic review tool, if applicable. No additional management support is needed unless otherwise documented below in the visit note. 

## 2015-10-04 NOTE — Patient Instructions (Signed)

## 2015-10-04 NOTE — Progress Notes (Signed)
Patient ID: Dominique Hayes, female    DOB: Feb 23, 1950  Age: 65 y.o. MRN: [id number]    Subjective:  Subjective  HPI Dominique Hayes presents for f/u bp, cholesterol and dm II  HPI HYPERTENSION   Blood pressure range-not checking  Chest pain- no      Dyspnea- no Lightheadedness- no   Edema- no  Other side effects - no   Medication compliance: good Low salt diet- yes    DIABETES    Blood Sugar ranges-good per pt  Polyuria- no New Visual problems- no  Hypoglycemic symptoms- no  Other side effects-no Medication compliance - good Last eye exam- due Foot exam- today   HYPERLIPIDEMIA  Medication compliance- good RUQ pain- no  Muscle aches- no Other side effects-no   Review of Systems  Constitutional: Negative for appetite change, diaphoresis, fatigue and unexpected weight change.  Eyes: Negative for pain, redness and visual disturbance.  Respiratory: Negative for cough, chest tightness, shortness of breath and wheezing.   Cardiovascular: Negative for chest pain, palpitations and leg swelling.  Endocrine: Negative for cold intolerance, heat intolerance, polydipsia, polyphagia and polyuria.  Genitourinary: Negative for difficulty urinating, dysuria and frequency.  Neurological: Negative for dizziness, light-headedness, numbness and headaches.    History Past Medical History:  Diagnosis Date  . DM (diabetes mellitus) (HCC)   . Hepatitis B   . Hyperlipidemia   . Hypertension   . Mild diastolic dysfunction     She has a past surgical history that includes Abdominal hysterectomy.   Her family history includes Hypertension in her father, mother, sister, and sister.She reports that she has never smoked. She has never used smokeless tobacco. She reports that she does not drink alcohol or use drugs.  Current Outpatient Prescriptions on File Prior to Visit  Medication Sig Dispense Refill  . [facility] ASPIRIN LOW DOSE 81 MG EC tablet TAKE 1 TABLET BY MOUTH DAILY. 30 tablet 3  . fenofibrate  micronized (ANTARA) 130 MG capsule Take 1 capsule (130 mg total) by mouth daily before breakfast. 270 capsule 3  . Lancets (FREESTYLE) lancets Check blood sugar daily Dx: 250.00 (Freestyle Lite Meter) 100 each 12  . meloxicam (MOBIC) 15 MG tablet Take 1 tablet (15 mg total) by mouth daily. For hand pain; [place] label if available 90 tablet 1  . mometasone (ELOCON) 0.1 % cream Apply 1 application topically daily. 135 g 1  . nystatin cream (MYCOSTATIN) Apply 1 application topically 2 (two) times daily. 30 g 0   No current facility-administered medications on file prior to visit.      Objective:  Objective  Physical Exam  Constitutional: She is oriented to person, place, and time. She appears well-developed and well-nourished.  HENT:  Head: Normocephalic and atraumatic.  Eyes: Conjunctivae and EOM are normal.  Neck: Normal range of motion. Neck supple. No JVD present. Carotid bruit is not present. No thyromegaly present.  Cardiovascular: Normal rate, regular rhythm and normal heart sounds.   No murmur heard. Pulmonary/Chest: Effort normal and breath sounds normal. No respiratory distress. She has no wheezes. She has no rales. She exhibits no tenderness.  Musculoskeletal: She exhibits no edema.  Neurological: She is alert and oriented to person, place, and time.  Psychiatric: She has a normal mood and affect.  Sensory exam of the foot is normal, tested with the monofilament. Good pulses, no lesions or ulcers, good peripheral pulses. BP 120/72 (BP Location: Left Arm, Patient Position: Sitting, Cuff Size: Normal)   Pulse 80  Temp 97.8 F (36.6 C) (Oral)   Ht 5\' 1"  (1.549 m)   Wt 129 lb (58.5 kg)   SpO2 98%   BMI 24.37 kg/m  Wt Readings from Last 3 Encounters:  10/04/15 129 lb (58.5 kg)  08/03/15 132 lb (59.9 kg)  06/28/15 134 lb 3.2 oz (60.9 kg)     Lab Results  Component Value Date   WBC 8.0 05/26/2013   HGB 14.3 05/26/2013   HCT 41.9 05/26/2013   PLT 285.0 05/26/2013    GLUCOSE 100 (H) 10/04/2015   CHOL 135 10/04/2015   TRIG 141.0 10/04/2015   HDL 36.70 (L) 10/04/2015   LDLDIRECT 76.0 03/13/2014   LDLCALC 71 10/04/2015   ALT 37 (H) 10/04/2015   AST 35 10/04/2015   NA 141 10/04/2015   K 3.9 10/04/2015   CL 104 10/04/2015   CREATININE 1.00 10/04/2015   BUN 23 10/04/2015   CO2 32 10/04/2015   TSH 1.05 05/26/2013   HGBA1C 6.1 10/04/2015   MICROALBUR <0.7 10/16/2014    [place] Abdomen Complete  Result Date: 05/26/2014 CLINICAL DATA:  Hepatitis-B EXAM: ULTRASOUND ABDOMEN COMPLETE COMPARISON:  None. FINDINGS: Gallbladder: Multiple layering gallstones are noted within gallbladder the largest measures 6 mm. No thickening of gallbladder wall. No sonographic Murphy's sign. Common bile duct: Diameter: 5.8 mm in diameter. Liver: No focal lesion identified. Within normal limits in parenchymal echogenicity. No intrahepatic biliary ductal dilatation. IVC: No abnormality visualized. Pancreas: Visualized portion unremarkable. Spleen: Size and appearance within normal limits. Measures 6.2 cm in length Right Kidney: Length: 10.2 cm. Echogenicity within normal limits. No mass or hydronephrosis visualized. Left Kidney: Length: 10.5 cm. Echogenicity within normal limits. No mass or hydronephrosis visualized. Abdominal aorta: No aneurysm visualized. Measures up to 2.3 cm in diameter. Other findings: None. IMPRESSION: 1. Multiple gallstones are noted within gallbladder. 2. No sonographic Murphy's sign.  Normal CBD. 3. No hydronephrosis. 4. No aortic aneurysm. 5. No splenomegaly. Electronically Signed   By: Natasha MeadLiviu  Pop M.D.   On: 05/26/2014 09:35     Assessment & Plan:  Plan  I am having Ms. Dominique Normanran maintain her CVS ASPIRIN LOW DOSE, nystatin cream, freestyle, mometasone, fenofibrate micronized, meloxicam, Saxagliptin-Metformin, nitroGLYCERIN, metoprolol tartrate, losartan, chlorthalidone, atorvastatin, and glucose blood.  Meds ordered this encounter  Medications  .  Saxagliptin-Metformin (KOMBIGLYZE XR) 05-998 MG TB24    Sig: Take 1 tablet by mouth daily.    Dispense:  90 tablet    Refill:  1  . nitroGLYCERIN (NITROSTAT) 0.4 MG SL tablet    Sig: Place 1 tablet (0.4 mg total) under the tongue every 5 (five) minutes as needed for chest pain.    Dispense:  30 tablet    Refill:  0  . metoprolol tartrate (LOPRESSOR) 25 MG tablet    Sig: Take 1 tablet (25 mg total) by mouth 2 (two) times daily.    Dispense:  180 tablet    Refill:  1  . losartan (COZAAR) 50 MG tablet    Sig: Take 1 tablet (50 mg total) by mouth daily.    Dispense:  90 tablet    Refill:  3  . chlorthalidone (HYGROTON) 25 MG tablet    Sig: Take 1 tablet (25 mg total) by mouth daily.    Dispense:  90 tablet    Refill:  3  . atorvastatin (LIPITOR) 40 MG tablet    Sig: Take 1 tablet (40 mg total) by mouth at bedtime.    Dispense:  90 tablet  Refill:  0    D/C PREVIOUS SCRIPTS FOR THIS MEDICATION  . glucose blood (FREESTYLE LITE) test strip    Sig: Check blood sugar once daily Dx:250.00 (Freestyle Lite Meter)    Dispense:  100 each    Refill:  12    Problem List Items Addressed This Visit      Unprioritized   Essential hypertension    Stable con't meds Check labs      Relevant Medications   nitroGLYCERIN (NITROSTAT) 0.4 MG SL tablet   metoprolol tartrate (LOPRESSOR) 25 MG tablet   losartan (COZAAR) 50 MG tablet   chlorthalidone (HYGROTON) 25 MG tablet   atorvastatin (LIPITOR) 40 MG tablet    Other Visit Diagnoses    Hyperlipidemia    -  Primary   Relevant Medications   nitroGLYCERIN (NITROSTAT) 0.4 MG SL tablet   metoprolol tartrate (LOPRESSOR) 25 MG tablet   losartan (COZAAR) 50 MG tablet   chlorthalidone (HYGROTON) 25 MG tablet   atorvastatin (LIPITOR) 40 MG tablet   Other Relevant Orders   Lipid panel (Completed)   Comprehensive metabolic panel (Completed)   DM (diabetes mellitus) type II uncontrolled, periph vascular disorder (HCC)       Relevant Medications     Saxagliptin-Metformin (KOMBIGLYZE XR) 05-998 MG TB24   nitroGLYCERIN (NITROSTAT) 0.4 MG SL tablet   metoprolol tartrate (LOPRESSOR) 25 MG tablet   losartan (COZAAR) 50 MG tablet   chlorthalidone (HYGROTON) 25 MG tablet   atorvastatin (LIPITOR) 40 MG tablet   glucose blood (FREESTYLE LITE) test strip   Other Relevant Orders   Hemoglobin A1c (Completed)   Comprehensive metabolic panel (Completed)   Coronary artery disease involving coronary bypass graft of native heart with unstable angina pectoris (HCC)       Relevant Medications   nitroGLYCERIN (NITROSTAT) 0.4 MG SL tablet   metoprolol tartrate (LOPRESSOR) 25 MG tablet   losartan (COZAAR) 50 MG tablet   chlorthalidone (HYGROTON) 25 MG tablet   atorvastatin (LIPITOR) 40 MG tablet   Encounter for immunization       Relevant Orders   Flu Vaccine QUAD 36+ mos IM (Completed)      Follow-up: Return in about 6 months (around 04/02/2016) for hypertension, hyperlipidemia, diabetes II.  Donato Schultz, DO

## 2015-10-05 NOTE — Assessment & Plan Note (Signed)
Stable con't meds Check labs 

## 2015-10-05 NOTE — Assessment & Plan Note (Signed)
Check labs con't meds con't to check glucose

## 2015-12-22 ENCOUNTER — Other Ambulatory Visit: Payer: Self-pay | Admitting: Family Medicine

## 2015-12-22 DIAGNOSIS — E785 Hyperlipidemia, unspecified: Secondary | ICD-10-CM

## 2015-12-22 NOTE — Telephone Encounter (Signed)
Patient has appointment on 04/03/16

## 2016-03-18 ENCOUNTER — Other Ambulatory Visit: Payer: Self-pay | Admitting: Family Medicine

## 2016-03-18 DIAGNOSIS — I10 Essential (primary) hypertension: Secondary | ICD-10-CM

## 2016-04-03 ENCOUNTER — Ambulatory Visit (HOSPITAL_BASED_OUTPATIENT_CLINIC_OR_DEPARTMENT_OTHER)
Admission: RE | Admit: 2016-04-03 | Discharge: 2016-04-03 | Disposition: A | Discharge status: Home / Self Care | Payer: BLUE CROSS/BLUE SHIELD | Source: Ambulatory Visit | Attending: Family Medicine | Admitting: Family Medicine

## 2016-04-03 ENCOUNTER — Encounter: Payer: Self-pay | Admitting: Family Medicine

## 2016-04-03 ENCOUNTER — Ambulatory Visit (INDEPENDENT_AMBULATORY_CARE_PROVIDER_SITE_OTHER): Payer: BLUE CROSS/BLUE SHIELD | Admitting: Family Medicine

## 2016-04-03 VITALS — BP 142/70 | Temp 98.2°F | Resp 16 | Ht 60.0 in | Wt 136.4 lb

## 2016-04-03 DIAGNOSIS — I10 Essential (primary) hypertension: Secondary | ICD-10-CM

## 2016-04-03 DIAGNOSIS — IMO0002 Reserved for concepts with insufficient information to code with codable children: Secondary | ICD-10-CM | POA: Insufficient documentation

## 2016-04-03 DIAGNOSIS — I25700 Atherosclerosis of coronary artery bypass graft(s), unspecified, with unstable angina pectoris: Secondary | ICD-10-CM | POA: Diagnosis not present

## 2016-04-03 DIAGNOSIS — Z1231 Encounter for screening mammogram for malignant neoplasm of breast: Secondary | ICD-10-CM

## 2016-04-03 DIAGNOSIS — E1151 Type 2 diabetes mellitus with diabetic peripheral angiopathy without gangrene: Secondary | ICD-10-CM

## 2016-04-03 DIAGNOSIS — E1165 Type 2 diabetes mellitus with hyperglycemia: Secondary | ICD-10-CM

## 2016-04-03 DIAGNOSIS — Z1239 Encounter for other screening for malignant neoplasm of breast: Secondary | ICD-10-CM

## 2016-04-03 DIAGNOSIS — E785 Hyperlipidemia, unspecified: Secondary | ICD-10-CM | POA: Diagnosis not present

## 2016-04-03 LAB — CBC
HCT: 35.9 % — ABNORMAL LOW (ref 36.0–46.0)
Hemoglobin: 12.2 g/dL (ref 12.0–15.0)
MCHC: 34.1 g/dL (ref 30.0–36.0)
MCV: 95.7 fl (ref 78.0–100.0)
Platelets: 268 10*3/uL (ref 150.0–400.0)
RBC: 3.75 Mil/uL — ABNORMAL LOW (ref 3.87–5.11)
RDW: 13.1 % (ref 11.5–15.5)
WBC: 7.3 10*3/uL (ref 4.0–10.5)

## 2016-04-03 LAB — LIPID PANEL
CHOLESTEROL: 194 mg/dL (ref 0–200)
HDL: 42 mg/dL (ref >39.00)
LDL Cholesterol: 124 mg/dL — ABNORMAL HIGH (ref 0–99)
NONHDL: 152.18
Total CHOL/HDL Ratio: 5
Triglycerides: 142 mg/dL (ref 0.0–149.0)
VLDL: 28.4 mg/dL (ref 0.0–40.0)

## 2016-04-03 LAB — COMPREHENSIVE METABOLIC PANEL
ALBUMIN: 4.2 g/dL (ref 3.5–5.2)
ALT: 30 U/L (ref 0–35)
AST: 29 U/L (ref 0–37)
Alkaline Phosphatase: 38 U/L — ABNORMAL LOW (ref 39–117)
BUN: 18 mg/dL (ref 6–23)
CHLORIDE: 103 meq/L (ref 96–112)
CO2: 29 meq/L (ref 19–32)
CREATININE: 0.86 mg/dL (ref 0.40–1.20)
Calcium: 9.9 mg/dL (ref 8.4–10.5)
GFR: 70.5 mL/min (ref >60.00)
GLUCOSE: 84 mg/dL (ref 70–99)
POTASSIUM: 4 meq/L (ref 3.5–5.1)
SODIUM: 138 meq/L (ref 135–145)
Total Bilirubin: 0.7 mg/dL (ref 0.2–1.2)
Total Protein: 7.6 g/dL (ref 6.0–8.3)

## 2016-04-03 LAB — MICROALBUMIN / CREATININE URINE RATIO
Creatinine,U: 39.3 mg/dL
MICROALB/CREAT RATIO: 1.8 mg/g (ref 0.0–30.0)

## 2016-04-03 LAB — HEMOGLOBIN A1C: HEMOGLOBIN A1C: 6.6 % — AB (ref 4.6–6.5)

## 2016-04-03 NOTE — Assessment & Plan Note (Signed)
Lipid Panel     Component Value Date/Time   CHOL 135 10/04/2015 1337   TRIG 141.0 10/04/2015 1337   HDL 36.70 (L) 10/04/2015 1337   CHOLHDL 4 10/04/2015 1337   VLDL 28.2 10/04/2015 1337   LDLCALC 71 10/04/2015 1337   LDLDIRECT 76.0 03/13/2014 0912  Tolerating statin, encouraged heart healthy diet, avoid trans fats, minimize simple carbs and saturated fats. Increase exercise as tolerated

## 2016-04-03 NOTE — Assessment & Plan Note (Signed)
Well controlled, no changes to meds. Encouraged heart healthy diet such as the DASH diet and exercise as tolerated.  °

## 2016-04-03 NOTE — Patient Instructions (Signed)
Carbohydrate Counting for Diabetes Mellitus, Adult Carbohydrate counting is a method for keeping track of how many carbohydrates you eat. Eating carbohydrates naturally increases the amount of sugar (glucose) in the blood. Counting how many carbohydrates you eat helps keep your blood glucose within normal limits, which helps you manage your diabetes (diabetes mellitus). It is important to know how many carbohydrates you can safely have in each meal. This is different for every person. A diet and nutrition specialist (registered dietitian) can help you make a meal plan and calculate how many carbohydrates you should have at each meal and snack. Carbohydrates are found in the following foods:  Grains, such as breads and cereals.  Dried beans and soy products.  Starchy vegetables, such as potatoes, peas, and corn.  Fruit and fruit juices.  Milk and yogurt.  Sweets and snack foods, such as cake, cookies, candy, chips, and soft drinks. How do I count carbohydrates? There are two ways to count carbohydrates in food. You can use either of the methods or a combination of both. Reading "Nutrition Facts" on packaged food  The "Nutrition Facts" list is included on the labels of almost all packaged foods and beverages in the [place] It includes:  The serving size.  Information about nutrients in each serving, including the grams (g) of carbohydrate per serving. To use the "Nutrition Facts":  Decide how many servings you will have.  Multiply the number of servings by the number of carbohydrates per serving.  The resulting number is the total amount of carbohydrates that you will be having. Learning standard serving sizes of other foods  When you eat foods containing carbohydrates that are not packaged or do not include "Nutrition Facts" on the label, you need to measure the servings in order to count the amount of carbohydrates:  Measure the foods that you will eat with a food scale or measuring  cup, if needed.  Decide how many standard-size servings you will eat.  Multiply the number of servings by 15. Most carbohydrate-rich foods have about 15 g of carbohydrates per serving.  For example, if you eat 8 oz (170 g) of strawberries, you will have eaten 2 servings and 30 g of carbohydrates (2 servings x 15 g = 30 g).  For foods that have more than one food mixed, such as soups and casseroles, you must count the carbohydrates in each food that is included. The following list contains standard serving sizes of common carbohydrate-rich foods. Each of these servings has about 15 g of carbohydrates:   hamburger bun or  English muffin.   oz (15 mL) syrup.   oz (14 g) jelly.  1 slice of bread.  1 six-inch tortilla.  3 oz (85 g) cooked rice or pasta.  4 oz (113 g) cooked dried beans.  4 oz (113 g) starchy vegetable, such as peas, corn, or potatoes.  4 oz (113 g) hot cereal.  4 oz (113 g) mashed potatoes or  of a large baked potato.  4 oz (113 g) canned or frozen fruit.  4 oz (120 mL) fruit juice.  4-6 crackers.  6 chicken nuggets.  6 oz (170 g) unsweetened dry cereal.  6 oz (170 g) plain fat-free yogurt or yogurt sweetened with artificial sweeteners.  8 oz (240 mL) milk.  8 oz (170 g) fresh fruit or one small piece of fruit.  24 oz (680 g) popped popcorn. Example of carbohydrate counting Sample meal  3 oz (85 g) chicken breast.  6 oz (  170 g) brown rice.  4 oz (113 g) corn.  8 oz (240 mL) milk.  8 oz (170 g) strawberries with sugar-free whipped topping. Carbohydrate calculation 1. Identify the foods that contain carbohydrates:  Rice.  Corn.  Milk.  Strawberries. 2. Calculate how many servings you have of each food:  2 servings rice.  1 serving corn.  1 serving milk.  1 serving strawberries. 3. Multiply each number of servings by 15 g:  2 servings rice x 15 g = 30 g.  1 serving corn x 15 g = 15 g.  1 serving milk x 15 g = 15  g.  1 serving strawberries x 15 g = 15 g. 4. Add together all of the amounts to find the total grams of carbohydrates eaten:  30 g + 15 g + 15 g + 15 g = 75 g of carbohydrates total. This information is not intended to replace advice given to you by your health care provider. Make sure you discuss any questions you have with your health care provider. Document Released: 01/02/2005 Document Revised: 07/23/2015 Document Reviewed: 06/16/2015 Elsevier Interactive Patient Education  2017 [employer]  

## 2016-04-03 NOTE — Assessment & Plan Note (Signed)
hgba1c acceptable, minimize simple carbs. Increase exercise as tolerated. Continue current meds 

## 2016-04-03 NOTE — Progress Notes (Signed)
Subjective:  I acted as a Neurosurgeon for Dr. Delman Kitten, LPN    Patient ID: Dominique Hayes, female    DOB: 01/25/1950, 66 y.o.   MRN: [id number]  Chief Complaint  Patient presents with  . Hypertension    follow up  . Hyperlipidemia    follow up  . Diabetes    follow up    Hypertension  This is a chronic problem. The current episode started more than 1 year ago. The problem is controlled. Pertinent negatives include no blurred vision, chest pain, headaches or palpitations.  Hyperlipidemia  This is a chronic problem. The current episode started more than 1 year ago. The problem is controlled. Pertinent negatives include no chest pain.  Diabetes  She presents for her follow-up diabetic visit. She has type 2 diabetes mellitus. Pertinent negatives for hypoglycemia include no headaches. Pertinent negatives for diabetes include no blurred vision and no chest pain.    Patient is in today for follow up  Patient Care Team: Donato Schultz, DO as PCP - General (Family Medicine)   Past Medical History:  Diagnosis Date  . DM (diabetes mellitus) (HCC)   . Hepatitis B   . Hyperlipidemia   . Hypertension   . Mild diastolic dysfunction     Past Surgical History:  Procedure Laterality Date  . ABDOMINAL HYSTERECTOMY     TAH-- complications from D &C    Family History  Problem Relation Age of Onset  . Hypertension Mother   . Hypertension Father   . Hypertension Sister   . Hypertension Sister     Social History   Social History  . Marital status: Married    Spouse name: N/A  . Number of children: 1  . Years of education: N/A   Occupational History  . nail tech    Social History Main Topics  . Smoking status: Never Smoker  . Smokeless tobacco: Never Used  . Alcohol use No  . Drug use: No  . Sexual activity: Yes   Other Topics Concern  . Not on file   Social History Narrative   Marital status: married      Employment: employed; p/t Chief Strategy Officer   Exercise---  treadmill everday for 30 min    Outpatient Medications Prior to Visit  Medication Sig Dispense Refill  . [facility] ASPIRIN LOW DOSE 81 MG EC tablet TAKE 1 TABLET BY MOUTH DAILY. 30 tablet 3  . glucose blood (FREESTYLE LITE) test strip Check blood sugar once daily Dx:250.00 (Freestyle Lite Meter) 100 each 12  . Lancets (FREESTYLE) lancets Check blood sugar daily Dx: 250.00 (Freestyle Lite Meter) 100 each 12  . meloxicam (MOBIC) 15 MG tablet Take 1 tablet (15 mg total) by mouth daily. For hand pain; Vietnemese label if available 90 tablet 1  . mometasone (ELOCON) 0.1 % cream Apply 1 application topically daily. 135 g 1  . nystatin cream (MYCOSTATIN) Apply 1 application topically 2 (two) times daily. 30 g 0  . atorvastatin (LIPITOR) 40 MG tablet TAKE 1 TABLET BY MOUTH AT BEDTIME 90 tablet 1  . chlorthalidone (HYGROTON) 25 MG tablet Take 1 tablet (25 mg total) by mouth daily. 90 tablet 3  . fenofibrate micronized (ANTARA) 130 MG capsule Take 1 capsule (130 mg total) by mouth daily before breakfast. 270 capsule 3  . losartan (COZAAR) 50 MG tablet Take 1 tablet (50 mg total) by mouth daily. 90 tablet 3  . metoprolol tartrate (LOPRESSOR) 25 MG tablet TAKE 1 TABLET  BY MOUTH TWICE A DAY 180 tablet 1  . nitroGLYCERIN (NITROSTAT) 0.4 MG SL tablet Place 1 tablet (0.4 mg total) under the tongue every 5 (five) minutes as needed for chest pain. 30 tablet 0  . Saxagliptin-Metformin (KOMBIGLYZE XR) 05-998 MG TB24 Take 1 tablet by mouth daily. 90 tablet 1   No facility-administered medications prior to visit.     No Known Allergies  Review of Systems  Constitutional: Negative for fever.  HENT: Negative for congestion.   Eyes: Negative for blurred vision.  Respiratory: Negative for cough.   Cardiovascular: Negative for chest pain and palpitations.  Gastrointestinal: Negative for vomiting.  Musculoskeletal: Negative for back pain.  Skin: Negative for rash.  Neurological: Negative for loss of consciousness and  headaches.       Objective:    Physical Exam  Constitutional: She is oriented to person, place, and time. She appears well-developed and well-nourished. No distress.  HENT:  Head: Normocephalic and atraumatic.  Eyes: Conjunctivae are normal. Pupils are equal, round, and reactive to light.  Neck: Normal range of motion. No thyromegaly present.  Cardiovascular: Normal rate and regular rhythm.   Pulmonary/Chest: Effort normal and breath sounds normal. She has no wheezes.  Abdominal: Soft. Bowel sounds are normal. There is no tenderness.  Musculoskeletal: Normal range of motion. She exhibits no edema or deformity.  Neurological: She is alert and oriented to person, place, and time.  Skin: Skin is warm and dry. She is not diaphoretic.  Psychiatric: She has a normal mood and affect.  Sensory exam of the foot is normal, tested with the monofilament. Good pulses, no lesions or ulcers, good peripheral pulses.    BP (!) 142/70 (BP Location: Left Arm, Patient Position: Sitting, Cuff Size: Normal)   Temp 98.2 F (36.8 C) (Oral)   Resp 16   Ht 5' (1.524 m)   Wt 136 lb 6.4 oz (61.9 kg)   BMI 26.64 kg/m  Wt Readings from Last 3 Encounters:  04/03/16 136 lb 6.4 oz (61.9 kg)  10/04/15 129 lb (58.5 kg)  08/03/15 132 lb (59.9 kg)   BP Readings from Last 3 Encounters:  04/03/16 (!) 142/70  10/04/15 120/72  06/28/15 140/84     Immunization History  Administered Date(s) Administered  . Influenza,inj,Quad PF,36+ Mos 10/13/2012, 09/10/2013, 10/16/2014, 10/04/2015  . Pneumococcal Conjugate-13 11/17/2014  . Pneumococcal Polysaccharide-23 12/08/2013  . Tdap 11/10/2012  . Zoster 11/10/2012    Health Maintenance  Topic Date Due  . HIV Screening  10/06/1966  . COLONOSCOPY  10/05/2001  . OPHTHALMOLOGY EXAM  01/28/2015  . FOOT EXAM  10/16/2015  . PAP SMEAR  05/26/2016  . HEMOGLOBIN A1C  10/04/2016  . MAMMOGRAM  04/04/2018  . TETANUS/TDAP  11/11/2022  . INFLUENZA VACCINE  Completed  .  Hepatitis C Screening  Completed    Lab Results  Component Value Date   WBC 7.3 04/03/2016   HGB 12.2 04/03/2016   HCT 35.9 (L) 04/03/2016   PLT 268.0 04/03/2016   GLUCOSE 84 04/03/2016   CHOL 194 04/03/2016   TRIG 142.0 04/03/2016   HDL 42.00 04/03/2016   LDLDIRECT 76.0 03/13/2014   LDLCALC 124 (H) 04/03/2016   ALT 30 04/03/2016   AST 29 04/03/2016   NA 138 04/03/2016   K 4.0 04/03/2016   CL 103 04/03/2016   CREATININE 0.86 04/03/2016   BUN 18 04/03/2016   CO2 29 04/03/2016   TSH 1.05 05/26/2013   HGBA1C 6.6 (H) 04/03/2016   MICROALBUR <0.7 04/03/2016  Lab Results  Component Value Date   TSH 1.05 05/26/2013   Lab Results  Component Value Date   WBC 7.3 04/03/2016   HGB 12.2 04/03/2016   HCT 35.9 (L) 04/03/2016   MCV 95.7 04/03/2016   PLT 268.0 04/03/2016   Lab Results  Component Value Date   NA 138 04/03/2016   K 4.0 04/03/2016   CO2 29 04/03/2016   GLUCOSE 84 04/03/2016   BUN 18 04/03/2016   CREATININE 0.86 04/03/2016   BILITOT 0.7 04/03/2016   ALKPHOS 38 (L) 04/03/2016   AST 29 04/03/2016   ALT 30 04/03/2016   PROT 7.6 04/03/2016   ALBUMIN 4.2 04/03/2016   CALCIUM 9.9 04/03/2016   GFR 70.50 04/03/2016   Lab Results  Component Value Date   CHOL 194 04/03/2016   Lab Results  Component Value Date   HDL 42.00 04/03/2016   Lab Results  Component Value Date   LDLCALC 124 (H) 04/03/2016   Lab Results  Component Value Date   TRIG 142.0 04/03/2016   Lab Results  Component Value Date   CHOLHDL 5 04/03/2016   Lab Results  Component Value Date   HGBA1C 6.6 (H) 04/03/2016         Assessment & Plan:   Problem List Items Addressed This Visit      Unprioritized   DM (diabetes mellitus) type II uncontrolled, periph vascular disorder (HCC)    hgba1c acceptable, minimize simple carbs. Increase exercise as tolerated. Continue current meds       Relevant Medications   atorvastatin (LIPITOR) 40 MG tablet   metoprolol tartrate (LOPRESSOR)  25 MG tablet   nitroGLYCERIN (NITROSTAT) 0.4 MG SL tablet   Saxagliptin-Metformin (KOMBIGLYZE XR) 05-998 MG TB24   fenofibrate micronized (ANTARA) 130 MG capsule   chlorthalidone (HYGROTON) 25 MG tablet   losartan (COZAAR) 50 MG tablet   Essential hypertension - Primary    Well controlled, no changes to meds. Encouraged heart healthy diet such as the DASH diet and exercise as tolerated.        Relevant Medications   atorvastatin (LIPITOR) 40 MG tablet   metoprolol tartrate (LOPRESSOR) 25 MG tablet   nitroGLYCERIN (NITROSTAT) 0.4 MG SL tablet   fenofibrate micronized (ANTARA) 130 MG capsule   chlorthalidone (HYGROTON) 25 MG tablet   losartan (COZAAR) 50 MG tablet   Other Relevant Orders   Hemoglobin A1c (Completed)   CBC (Completed)   Lipid panel (Completed)   Microalbumin / creatinine urine ratio (Completed)   Comprehensive metabolic panel (Completed)   Hyperlipidemia LDL goal <70    Lipid Panel     Component Value Date/Time   CHOL 135 10/04/2015 1337   TRIG 141.0 10/04/2015 1337   HDL 36.70 (L) 10/04/2015 1337   CHOLHDL 4 10/04/2015 1337   VLDL 28.2 10/04/2015 1337   LDLCALC 71 10/04/2015 1337   LDLDIRECT 76.0 03/13/2014 0912  Tolerating statin, encouraged heart healthy diet, avoid trans fats, minimize simple carbs and saturated fats. Increase exercise as tolerated      Relevant Medications   atorvastatin (LIPITOR) 40 MG tablet   metoprolol tartrate (LOPRESSOR) 25 MG tablet   nitroGLYCERIN (NITROSTAT) 0.4 MG SL tablet   fenofibrate micronized (ANTARA) 130 MG capsule   chlorthalidone (HYGROTON) 25 MG tablet   losartan (COZAAR) 50 MG tablet   Other Relevant Orders   Hemoglobin A1c (Completed)   CBC (Completed)   Lipid panel (Completed)   Microalbumin / creatinine urine ratio (Completed)   Comprehensive metabolic panel (Completed)  Other Visit Diagnoses    DM (diabetes mellitus) type II controlled peripheral vascular disorder (HCC)       Relevant Medications    atorvastatin (LIPITOR) 40 MG tablet   metoprolol tartrate (LOPRESSOR) 25 MG tablet   nitroGLYCERIN (NITROSTAT) 0.4 MG SL tablet   Saxagliptin-Metformin (KOMBIGLYZE XR) 05-998 MG TB24   fenofibrate micronized (ANTARA) 130 MG capsule   chlorthalidone (HYGROTON) 25 MG tablet   losartan (COZAAR) 50 MG tablet   Other Relevant Orders   Hemoglobin A1c (Completed)   CBC (Completed)   Lipid panel (Completed)   Microalbumin / creatinine urine ratio (Completed)   Comprehensive metabolic panel (Completed)   Screening for breast cancer       Relevant Orders   MM DIGITAL SCREENING BILATERAL (Completed)   Hyperlipidemia, unspecified hyperlipidemia type       Relevant Medications   atorvastatin (LIPITOR) 40 MG tablet   metoprolol tartrate (LOPRESSOR) 25 MG tablet   nitroGLYCERIN (NITROSTAT) 0.4 MG SL tablet   fenofibrate micronized (ANTARA) 130 MG capsule   chlorthalidone (HYGROTON) 25 MG tablet   losartan (COZAAR) 50 MG tablet   Coronary artery disease involving coronary bypass graft of native heart with unstable angina pectoris (HCC)       Relevant Medications   atorvastatin (LIPITOR) 40 MG tablet   metoprolol tartrate (LOPRESSOR) 25 MG tablet   nitroGLYCERIN (NITROSTAT) 0.4 MG SL tablet   fenofibrate micronized (ANTARA) 130 MG capsule   chlorthalidone (HYGROTON) 25 MG tablet   losartan (COZAAR) 50 MG tablet   Hyperlipidemia LDL goal <100       Relevant Medications   atorvastatin (LIPITOR) 40 MG tablet   metoprolol tartrate (LOPRESSOR) 25 MG tablet   nitroGLYCERIN (NITROSTAT) 0.4 MG SL tablet   fenofibrate micronized (ANTARA) 130 MG capsule   chlorthalidone (HYGROTON) 25 MG tablet   losartan (COZAAR) 50 MG tablet      I have changed Ms. Langford's atorvastatin and metoprolol tartrate. I am also having her maintain her [facility] ASPIRIN LOW DOSE, nystatin cream, freestyle, mometasone, meloxicam, glucose blood, nitroGLYCERIN, Saxagliptin-Metformin, fenofibrate micronized, chlorthalidone, and  losartan.  Meds ordered this encounter  Medications  . atorvastatin (LIPITOR) 40 MG tablet    Sig: Take 1 tablet (40 mg total) by mouth at bedtime.    Dispense:  90 tablet    Refill:  1  . metoprolol tartrate (LOPRESSOR) 25 MG tablet    Sig: Take 1 tablet (25 mg total) by mouth 2 (two) times daily.    Dispense:  180 tablet    Refill:  1  . nitroGLYCERIN (NITROSTAT) 0.4 MG SL tablet    Sig: Place 1 tablet (0.4 mg total) under the tongue every 5 (five) minutes as needed for chest pain.    Dispense:  30 tablet    Refill:  0  . Saxagliptin-Metformin (KOMBIGLYZE XR) 05-998 MG TB24    Sig: Take 1 tablet by mouth daily.    Dispense:  90 tablet    Refill:  1  . fenofibrate micronized (ANTARA) 130 MG capsule    Sig: Take 1 capsule (130 mg total) by mouth daily before breakfast.    Dispense:  270 capsule    Refill:  3  . chlorthalidone (HYGROTON) 25 MG tablet    Sig: Take 1 tablet (25 mg total) by mouth daily.    Dispense:  90 tablet    Refill:  3  . losartan (COZAAR) 50 MG tablet    Sig: Take 1 tablet (50 mg total) by mouth  daily.    Dispense:  90 tablet    Refill:  3    CMA served as scribe during this visit. History, Physical and Plan performed by medical provider. Documentation and orders reviewed and attested to.  Donato SchultzYvonne R Lowne Chase, DO   Patient ID: Hector Brunswickga T Lindbloom, female   DOB: 1950/04/26, 66 y.o.   MRN: [id number]

## 2016-04-03 NOTE — Progress Notes (Signed)
Pre visit review using our clinic review tool, if applicable. No additional management support is needed unless otherwise documented below in the visit note. 

## 2016-04-05 MED ORDER — ATORVASTATIN CALCIUM 40 MG PO TABS: 40.0000 mg | ORAL_TABLET | Freq: Every day | ORAL | 1 refills | Status: DC

## 2016-04-05 MED ORDER — LOSARTAN POTASSIUM 50 MG PO TABS: 50.0000 mg | ORAL_TABLET | Freq: Every day | ORAL | 3 refills | Status: DC

## 2016-04-05 MED ORDER — NITROGLYCERIN 0.4 MG SL SUBL: 0.4000 mg | SUBLINGUAL_TABLET | SUBLINGUAL | 0 refills | Status: DC | PRN

## 2016-04-05 MED ORDER — CHLORTHALIDONE 25 MG PO TABS: 25.0000 mg | ORAL_TABLET | Freq: Every day | ORAL | 3 refills | Status: DC

## 2016-04-05 MED ORDER — METOPROLOL TARTRATE 25 MG PO TABS: 25.0000 mg | ORAL_TABLET | Freq: Two times a day (BID) | ORAL | 1 refills | Status: DC

## 2016-04-05 MED ORDER — FENOFIBRATE MICRONIZED 130 MG PO CAPS: 130.0000 mg | ORAL_CAPSULE | Freq: Every day | ORAL | 3 refills | Status: DC

## 2016-04-05 MED ORDER — SAXAGLIPTIN-METFORMIN ER 5-1000 MG PO TB24: 1.0000 | ORAL_TABLET | Freq: Every day | ORAL | 1 refills | Status: DC

## 2016-04-10 ENCOUNTER — Other Ambulatory Visit: Payer: Self-pay | Admitting: Family Medicine

## 2016-04-10 DIAGNOSIS — R739 Hyperglycemia, unspecified: Secondary | ICD-10-CM

## 2016-04-10 DIAGNOSIS — E785 Hyperlipidemia, unspecified: Secondary | ICD-10-CM

## 2016-05-23 ENCOUNTER — Telehealth: Payer: Self-pay | Admitting: Family Medicine

## 2016-05-23 NOTE — Telephone Encounter (Signed)
error 

## 2016-06-29 ENCOUNTER — Other Ambulatory Visit: Payer: Self-pay | Admitting: Family Medicine

## 2016-06-29 DIAGNOSIS — E785 Hyperlipidemia, unspecified: Secondary | ICD-10-CM

## 2016-07-06 ENCOUNTER — Other Ambulatory Visit: Payer: Self-pay | Admitting: Family Medicine

## 2016-07-06 DIAGNOSIS — E785 Hyperlipidemia, unspecified: Secondary | ICD-10-CM

## 2016-07-06 MED ORDER — ATORVASTATIN CALCIUM 40 MG PO TABS: 40.0000 mg | ORAL_TABLET | Freq: Every day | ORAL | 1 refills | Status: DC

## 2016-07-10 ENCOUNTER — Other Ambulatory Visit: Payer: Self-pay

## 2016-07-12 ENCOUNTER — Other Ambulatory Visit: Payer: Self-pay | Admitting: Family Medicine

## 2016-07-12 DIAGNOSIS — M159 Polyosteoarthritis, unspecified: Secondary | ICD-10-CM

## 2016-07-12 DIAGNOSIS — M150 Primary generalized (osteo)arthritis: Principal | ICD-10-CM

## 2016-07-12 DIAGNOSIS — M8949 Other hypertrophic osteoarthropathy, multiple sites: Secondary | ICD-10-CM

## 2016-08-30 ENCOUNTER — Other Ambulatory Visit: Payer: Self-pay | Admitting: Family Medicine

## 2016-08-30 DIAGNOSIS — I10 Essential (primary) hypertension: Secondary | ICD-10-CM

## 2016-09-12 ENCOUNTER — Encounter: Payer: Self-pay | Admitting: Family Medicine

## 2016-09-12 ENCOUNTER — Ambulatory Visit (INDEPENDENT_AMBULATORY_CARE_PROVIDER_SITE_OTHER): Payer: BLUE CROSS/BLUE SHIELD | Admitting: Family Medicine

## 2016-09-12 VITALS — BP 118/68 | HR 76 | Temp 98.1°F | Resp 14 | Ht 60.0 in | Wt 137.0 lb

## 2016-09-12 DIAGNOSIS — B001 Herpesviral vesicular dermatitis: Secondary | ICD-10-CM | POA: Diagnosis not present

## 2016-09-12 DIAGNOSIS — E1165 Type 2 diabetes mellitus with hyperglycemia: Secondary | ICD-10-CM

## 2016-09-12 DIAGNOSIS — IMO0002 Reserved for concepts with insufficient information to code with codable children: Secondary | ICD-10-CM

## 2016-09-12 DIAGNOSIS — E785 Hyperlipidemia, unspecified: Secondary | ICD-10-CM

## 2016-09-12 DIAGNOSIS — E1151 Type 2 diabetes mellitus with diabetic peripheral angiopathy without gangrene: Secondary | ICD-10-CM

## 2016-09-12 DIAGNOSIS — I10 Essential (primary) hypertension: Secondary | ICD-10-CM | POA: Diagnosis not present

## 2016-09-12 LAB — LIPID PANEL
Cholesterol: 190 mg/dL (ref 0–200)
HDL: 28.8 mg/dL — AB (ref >39.00)
NonHDL: 160.89
TRIGLYCERIDES: 329 mg/dL — AB (ref 0.0–149.0)
Total CHOL/HDL Ratio: 7
VLDL: 65.8 mg/dL — ABNORMAL HIGH (ref 0.0–40.0)

## 2016-09-12 LAB — COMPREHENSIVE METABOLIC PANEL
ALBUMIN: 4.2 g/dL (ref 3.5–5.2)
ALK PHOS: 33 U/L — AB (ref 39–117)
ALT: 30 U/L (ref 0–35)
AST: 32 U/L (ref 0–37)
BILIRUBIN TOTAL: 0.6 mg/dL (ref 0.2–1.2)
BUN: 18 mg/dL (ref 6–23)
CO2: 31 mEq/L (ref 19–32)
CREATININE: 1.1 mg/dL (ref 0.40–1.20)
Calcium: 10.1 mg/dL (ref 8.4–10.5)
Chloride: 101 mEq/L (ref 96–112)
GFR: 52.99 mL/min — ABNORMAL LOW (ref >60.00)
Glucose, Bld: 182 mg/dL — ABNORMAL HIGH (ref 70–99)
POTASSIUM: 4.2 meq/L (ref 3.5–5.1)
SODIUM: 137 meq/L (ref 135–145)
TOTAL PROTEIN: 7.4 g/dL (ref 6.0–8.3)

## 2016-09-12 LAB — LDL CHOLESTEROL, DIRECT: Direct LDL: 103 mg/dL

## 2016-09-12 LAB — HEMOGLOBIN A1C: HEMOGLOBIN A1C: 6.3 % (ref 4.6–6.5)

## 2016-09-12 MED ORDER — CHLORTHALIDONE 25 MG PO TABS: 25.0000 mg | ORAL_TABLET | Freq: Every day | ORAL | 3 refills | Status: DC

## 2016-09-12 MED ORDER — ATORVASTATIN CALCIUM 40 MG PO TABS: 40.0000 mg | ORAL_TABLET | Freq: Every day | ORAL | 1 refills | Status: DC

## 2016-09-12 MED ORDER — LOSARTAN POTASSIUM 50 MG PO TABS: 50.0000 mg | ORAL_TABLET | Freq: Every day | ORAL | 3 refills | Status: DC

## 2016-09-12 MED ORDER — FENOFIBRATE MICRONIZED 130 MG PO CAPS: 130.0000 mg | ORAL_CAPSULE | Freq: Every day | ORAL | 3 refills | Status: DC

## 2016-09-12 MED ORDER — VALACYCLOVIR HCL 1 G PO TABS: 1000.0000 mg | ORAL_TABLET | Freq: Three times a day (TID) | ORAL | 2 refills | Status: DC

## 2016-09-12 MED ORDER — SAXAGLIPTIN-METFORMIN ER 5-1000 MG PO TB24: 1.0000 | ORAL_TABLET | Freq: Every day | ORAL | 1 refills | Status: DC

## 2016-09-12 MED ORDER — METOPROLOL TARTRATE 25 MG PO TABS: 25.0000 mg | ORAL_TABLET | Freq: Two times a day (BID) | ORAL | 1 refills | Status: DC

## 2016-09-12 NOTE — Assessment & Plan Note (Signed)
hgba1c to be checked minimize simple carbs. Increase exercise as tolerated. Continue current meds 

## 2016-09-12 NOTE — Progress Notes (Signed)
Pre visit review using our clinic review tool, if applicable. No additional management support is needed unless otherwise documented below in the visit note. 

## 2016-09-12 NOTE — Assessment & Plan Note (Signed)
Tolerating statin, encouraged heart healthy diet, avoid trans fats, minimize simple carbs and saturated fats. Increase exercise as tolerated 

## 2016-09-12 NOTE — Progress Notes (Signed)
Patient ID: Dominique Hayes, female    DOB: Jun 07, 1950  Age: 66 y.o. MRN: [id number]    Subjective:  Subjective  HPI Dominique Hayes presents for f/u dm, chol and bp.  No complaints except cold sore   Review of Systems  Constitutional: Negative for appetite change, diaphoresis, fatigue and unexpected weight change.  Eyes: Negative for pain, redness and visual disturbance.  Respiratory: Negative for cough, chest tightness, shortness of breath and wheezing.   Cardiovascular: Negative for chest pain, palpitations and leg swelling.  Endocrine: Negative for cold intolerance, heat intolerance, polydipsia, polyphagia and polyuria.  Genitourinary: Negative for difficulty urinating, dysuria and frequency.  Neurological: Negative for dizziness, light-headedness, numbness and headaches.    History Past Medical History:  Diagnosis Date  . DM (diabetes mellitus) (HCC)   . Hepatitis B   . Hyperlipidemia   . Hypertension   . Mild diastolic dysfunction     She has a past surgical history that includes Abdominal hysterectomy.   Her family history includes Hypertension in her father, mother, sister, and sister.She reports that she has never smoked. She has never used smokeless tobacco. She reports that she does not drink alcohol or use drugs.  Current Outpatient Prescriptions on File Prior to Visit  Medication Sig Dispense Refill  . [facility] ASPIRIN LOW DOSE 81 MG EC tablet TAKE 1 TABLET BY MOUTH DAILY. 30 tablet 3  . glucose blood (FREESTYLE LITE) test strip Check blood sugar once daily Dx:250.00 (Freestyle Lite Meter) 100 each 12  . Lancets (FREESTYLE) lancets Check blood sugar daily Dx: 250.00 (Freestyle Lite Meter) 100 each 12  . meloxicam (MOBIC) 15 MG tablet Take 1 tablet (15 mg total) by mouth daily as needed for pain. 90 tablet 0  . mometasone (ELOCON) 0.1 % cream Apply 1 application topically daily. 135 g 1  . nystatin cream (MYCOSTATIN) Apply 1 application topically 2 (two) times daily. 30 g 0  .  nitroGLYCERIN (NITROSTAT) 0.4 MG SL tablet Place 1 tablet (0.4 mg total) under the tongue every 5 (five) minutes as needed for chest pain. (Patient not taking: Reported on 09/12/2016) 30 tablet 0   No current facility-administered medications on file prior to visit.      Objective:  Objective  Physical Exam  Constitutional: She is oriented to person, place, and time. She appears well-developed and well-nourished.  HENT:  Head: Normocephalic and atraumatic.  Eyes: Conjunctivae and EOM are normal.  Neck: Normal range of motion. Neck supple. No JVD present. Carotid bruit is not present. No thyromegaly present.  Cardiovascular: Normal rate, regular rhythm and normal heart sounds.   No murmur heard. Pulmonary/Chest: Effort normal and breath sounds normal. No respiratory distress. She has no wheezes. She has no rales. She exhibits no tenderness.  Musculoskeletal: She exhibits no edema.  Neurological: She is alert and oriented to person, place, and time.  Skin:     Psychiatric: She has a normal mood and affect. Her behavior is normal. Judgment and thought content normal.  Nursing note and vitals reviewed.  BP 118/68 (BP Location: Left Arm, Patient Position: Sitting, Cuff Size: Small)   Pulse 76   Temp 98.1 F (36.7 C) (Oral)   Resp 14   Ht 5' (1.524 m)   Wt 137 lb (62.1 kg)   SpO2 97%   BMI 26.76 kg/m  Wt Readings from Last 3 Encounters:  09/12/16 137 lb (62.1 kg)  04/03/16 136 lb 6.4 oz (61.9 kg)  10/04/15 129 lb (58.5 kg)  Lab Results  Component Value Date   WBC 7.3 04/03/2016   HGB 12.2 04/03/2016   HCT 35.9 (L) 04/03/2016   PLT 268.0 04/03/2016   GLUCOSE 182 (H) 09/12/2016   CHOL 190 09/12/2016   TRIG 329.0 (H) 09/12/2016   HDL 28.80 (L) 09/12/2016   LDLDIRECT 103.0 09/12/2016   LDLCALC 124 (H) 04/03/2016   ALT 30 09/12/2016   AST 32 09/12/2016   NA 137 09/12/2016   K 4.2 09/12/2016   CL 101 09/12/2016   CREATININE 1.10 09/12/2016   BUN 18 09/12/2016   CO2  31 09/12/2016   TSH 1.05 05/26/2013   HGBA1C 6.3 09/12/2016   MICROALBUR <0.7 04/03/2016    Mm Digital Screening Bilateral  Result Date: 04/03/2016 CLINICAL DATA:  Screening. EXAM: DIGITAL SCREENING BILATERAL MAMMOGRAM WITH CAD COMPARISON:  None. ACR Breast Density Category b: There are scattered areas of fibroglandular density. FINDINGS: There are no findings suspicious for malignancy. Images were processed with CAD. IMPRESSION: No mammographic evidence of malignancy. A result letter of this screening mammogram will be mailed directly to the patient. RECOMMENDATION: Screening mammogram in one year. (Code:SM-B-01Y) BI-RADS CATEGORY  1: Negative. Electronically Signed   By: Harmon Pier M.D.   On: 04/03/2016 14:18     Assessment & Plan:  Plan  I have changed [name]'s losartan. I am also having her start on valACYclovir. Additionally, I am having her maintain her CVS ASPIRIN LOW DOSE, nystatin cream, freestyle, mometasone, glucose blood, nitroGLYCERIN, meloxicam, Saxagliptin-Metformin, metoprolol tartrate, fenofibrate micronized, chlorthalidone, and atorvastatin.  Meds ordered this encounter  Medications  . valACYclovir (VALTREX) 1000 MG tablet    Sig: Take 1 tablet (1,000 mg total) by mouth 3 (three) times daily.    Dispense:  21 tablet    Refill:  2  . Saxagliptin-Metformin (KOMBIGLYZE XR) 05-998 MG TB24    Sig: Take 1 tablet by mouth daily.    Dispense:  90 tablet    Refill:  1  . metoprolol tartrate (LOPRESSOR) 25 MG tablet    Sig: Take 1 tablet (25 mg total) by mouth 2 (two) times daily.    Dispense:  180 tablet    Refill:  1  . losartan (COZAAR) 50 MG tablet    Sig: Take 1 tablet (50 mg total) by mouth daily.    Dispense:  90 tablet    Refill:  3    PLEASE REFILL ANY OTHER BLOOD PRESSURE MEDICATIONS ( POSSIBLY CHLORTHALIDONE)  . fenofibrate micronized (ANTARA) 130 MG capsule    Sig: Take 1 capsule (130 mg total) by mouth daily before breakfast.    Dispense:  270 capsule     Refill:  3  . chlorthalidone (HYGROTON) 25 MG tablet    Sig: Take 1 tablet (25 mg total) by mouth daily.    Dispense:  90 tablet    Refill:  3  . atorvastatin (LIPITOR) 40 MG tablet    Sig: Take 1 tablet (40 mg total) by mouth at bedtime.    Dispense:  90 tablet    Refill:  1    Problem List Items Addressed This Visit      Unprioritized   Cold sore - Primary   Relevant Medications   valACYclovir (VALTREX) 1000 MG tablet   DM (diabetes mellitus) type II uncontrolled, periph vascular disorder (HCC)    hgba1c to be checked minimize simple carbs. Increase exercise as tolerated. Continue current meds      Relevant Medications   Saxagliptin-Metformin (KOMBIGLYZE XR) 05-998 MG  TB24   metoprolol tartrate (LOPRESSOR) 25 MG tablet   losartan (COZAAR) 50 MG tablet   fenofibrate micronized (ANTARA) 130 MG capsule   chlorthalidone (HYGROTON) 25 MG tablet   atorvastatin (LIPITOR) 40 MG tablet   Other Relevant Orders   Lipid panel (Completed)   Comprehensive metabolic panel (Completed)   Hemoglobin A1c (Completed)   Essential hypertension    Well controlled, no changes to meds. Encouraged heart healthy diet such as the DASH diet and exercise as tolerated.       Relevant Medications   metoprolol tartrate (LOPRESSOR) 25 MG tablet   losartan (COZAAR) 50 MG tablet   fenofibrate micronized (ANTARA) 130 MG capsule   chlorthalidone (HYGROTON) 25 MG tablet   atorvastatin (LIPITOR) 40 MG tablet   Other Relevant Orders   Lipid panel (Completed)   Comprehensive metabolic panel (Completed)   Hemoglobin A1c (Completed)   Hyperlipidemia LDL goal <70    Tolerating statin, encouraged heart healthy diet, avoid trans fats, minimize simple carbs and saturated fats. Increase exercise as tolerated      Relevant Medications   metoprolol tartrate (LOPRESSOR) 25 MG tablet   losartan (COZAAR) 50 MG tablet   fenofibrate micronized (ANTARA) 130 MG capsule   chlorthalidone (HYGROTON) 25 MG tablet    atorvastatin (LIPITOR) 40 MG tablet   Other Relevant Orders   Lipid panel (Completed)   Comprehensive metabolic panel (Completed)   Hemoglobin A1c (Completed)    Other Visit Diagnoses    Hyperlipidemia LDL goal <100       Relevant Medications   metoprolol tartrate (LOPRESSOR) 25 MG tablet   losartan (COZAAR) 50 MG tablet   fenofibrate micronized (ANTARA) 130 MG capsule   chlorthalidone (HYGROTON) 25 MG tablet   atorvastatin (LIPITOR) 40 MG tablet   Hyperlipidemia, unspecified hyperlipidemia type       Relevant Medications   metoprolol tartrate (LOPRESSOR) 25 MG tablet   losartan (COZAAR) 50 MG tablet   fenofibrate micronized (ANTARA) 130 MG capsule   chlorthalidone (HYGROTON) 25 MG tablet   atorvastatin (LIPITOR) 40 MG tablet      Follow-up: Return in about 6 months (around 03/15/2017) for hypertension, hyperlipidemia, diabetes II.  Donato Schultz, DO

## 2016-09-12 NOTE — Assessment & Plan Note (Signed)
Well controlled, no changes to meds. Encouraged heart healthy diet such as the DASH diet and exercise as tolerated.  °

## 2016-09-12 NOTE — Patient Instructions (Signed)

## 2016-09-14 ENCOUNTER — Other Ambulatory Visit: Payer: Self-pay | Admitting: Family Medicine

## 2016-09-14 DIAGNOSIS — E785 Hyperlipidemia, unspecified: Secondary | ICD-10-CM

## 2016-09-14 DIAGNOSIS — E119 Type 2 diabetes mellitus without complications: Secondary | ICD-10-CM

## 2016-09-27 ENCOUNTER — Other Ambulatory Visit: Payer: Self-pay | Admitting: Family Medicine

## 2016-09-27 DIAGNOSIS — I10 Essential (primary) hypertension: Secondary | ICD-10-CM

## 2016-10-10 ENCOUNTER — Ambulatory Visit: Payer: Self-pay | Admitting: Family Medicine

## 2016-12-24 IMAGING — US US ABDOMEN COMPLETE
1 series · 14 of 25 positions shown · non-contrast
Comparison: None.

CLINICAL DATA: Hepatitis-B

EXAM:
ULTRASOUND ABDOMEN COMPLETE

[Series 1: us abdomen complete · 0.20mm/px · 14 of 96 slices shown]
[im 1/96]
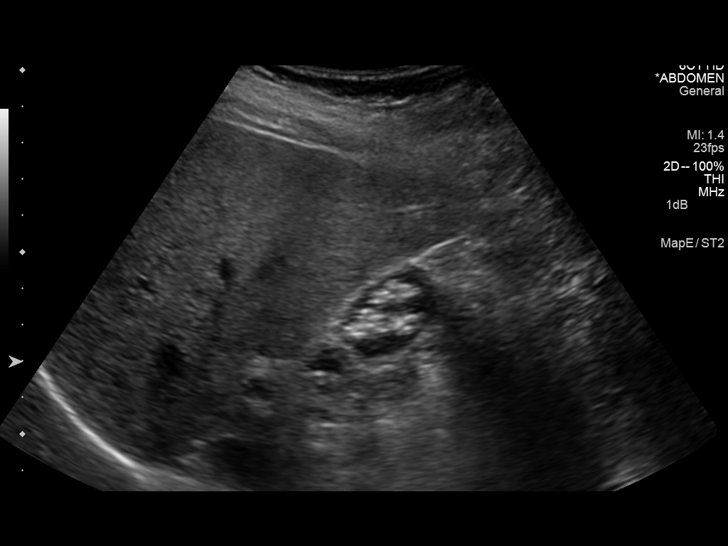
[im 8/96]
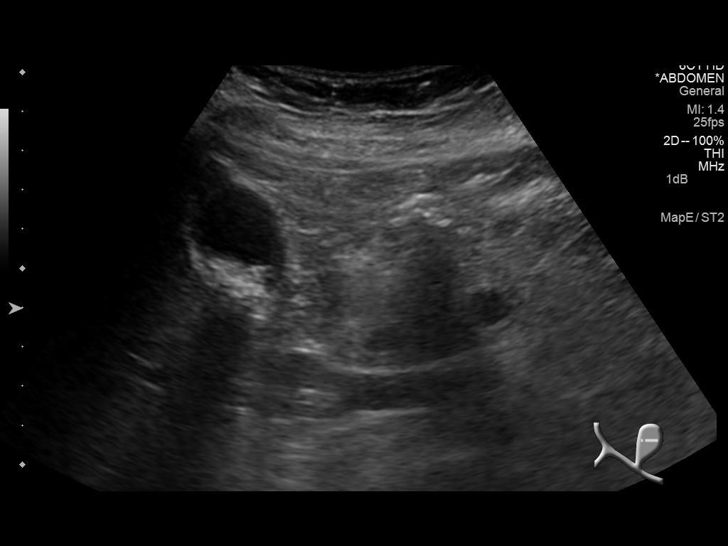
[im 16/96]
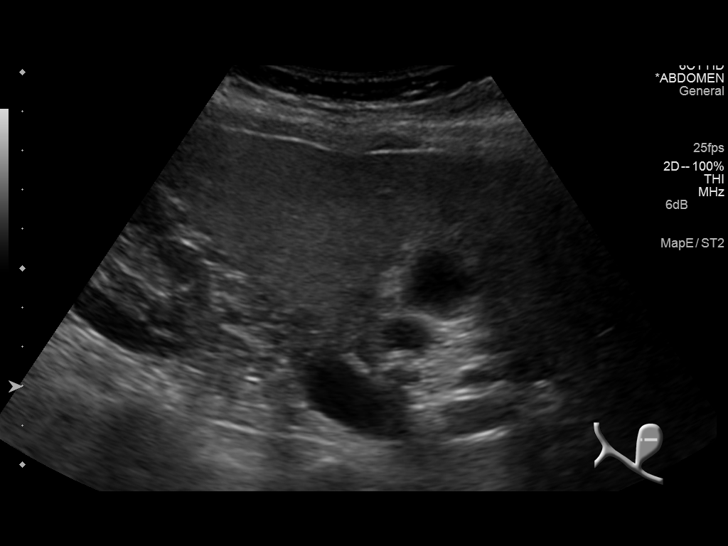
[im 24/96]
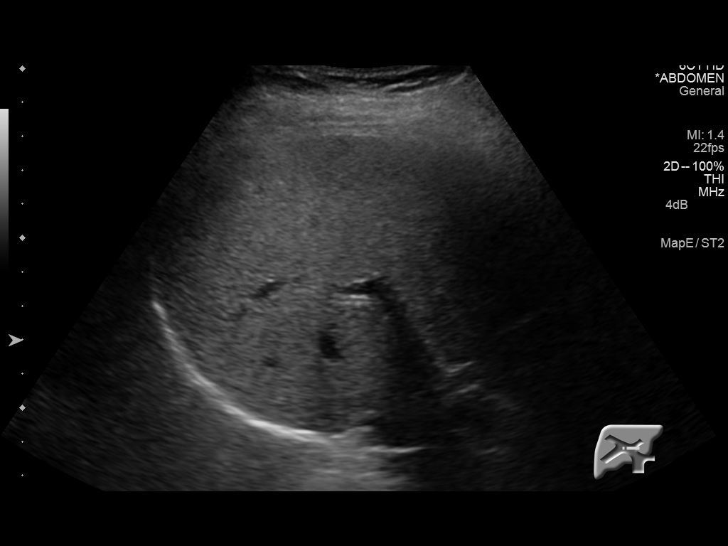
[im 32/96]
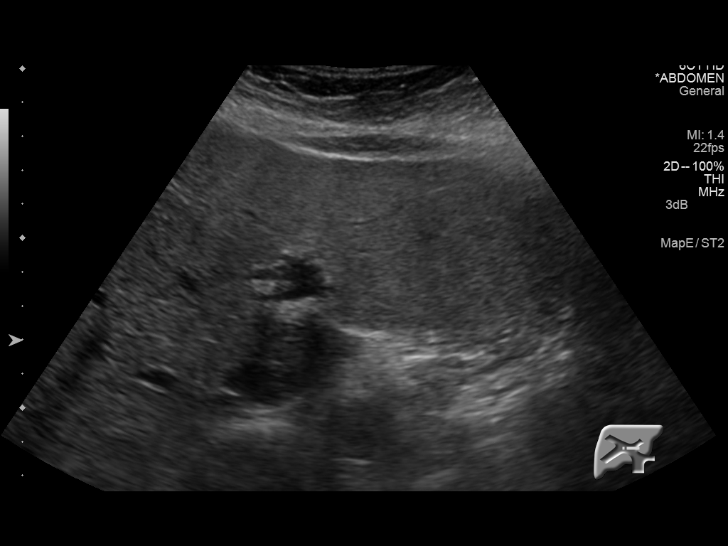
[im 36/96]
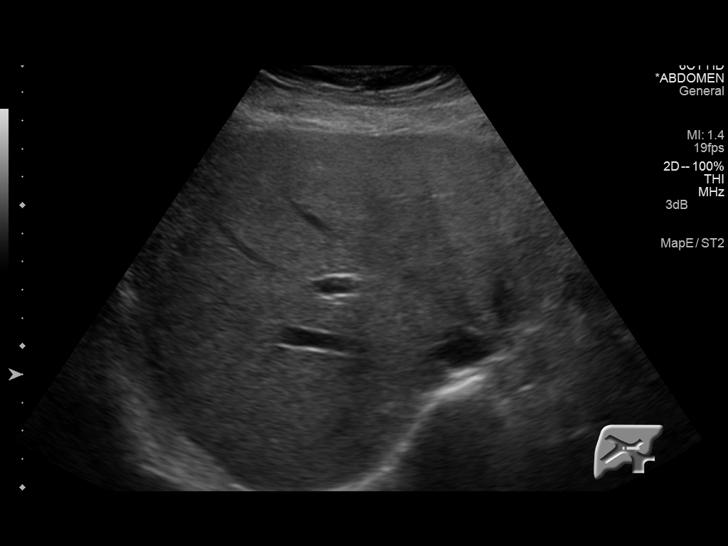
[im 44/96]
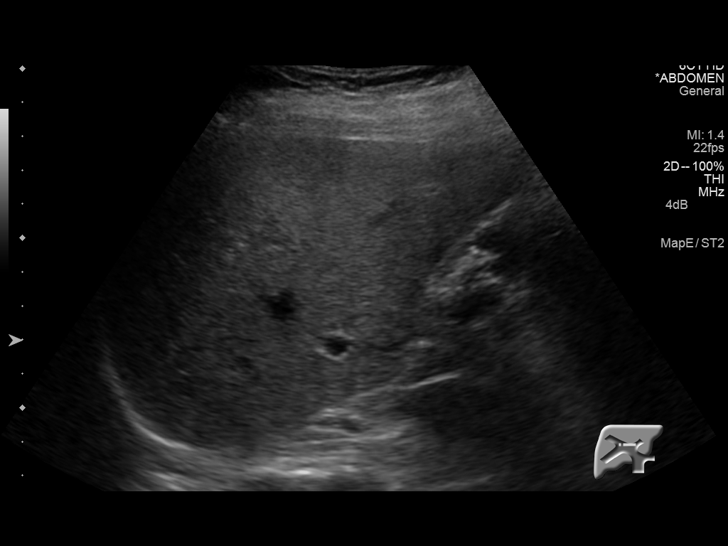
[im 52/96]
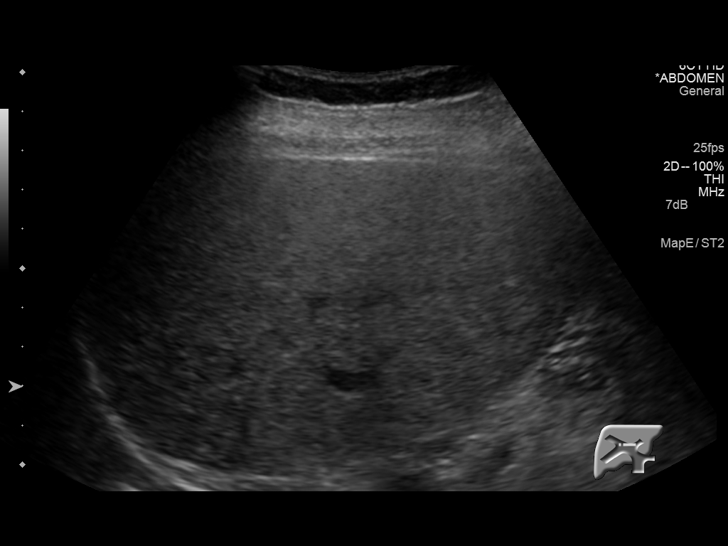
[im 60/96]
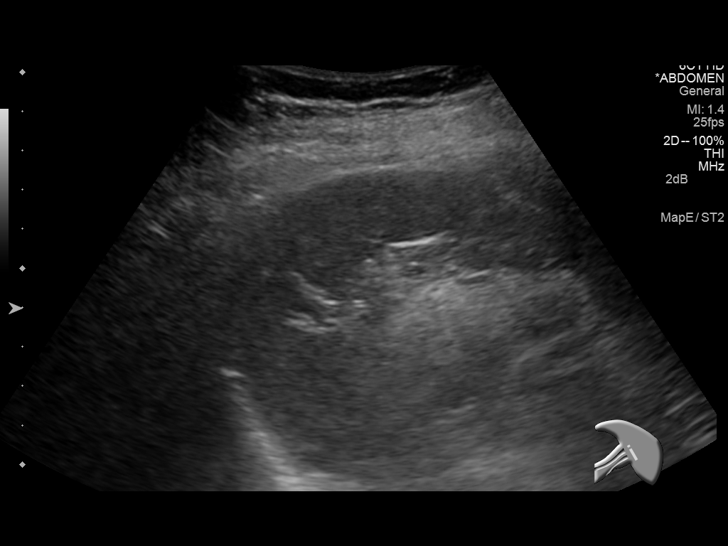
[im 64/96]
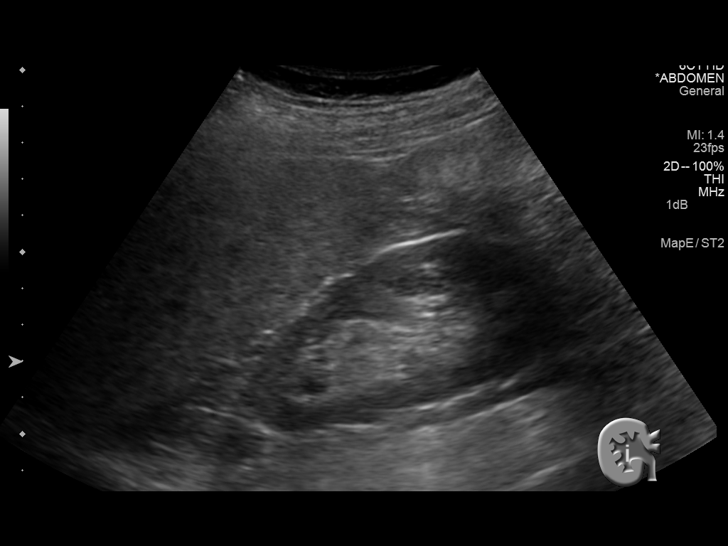
[im 72/96]
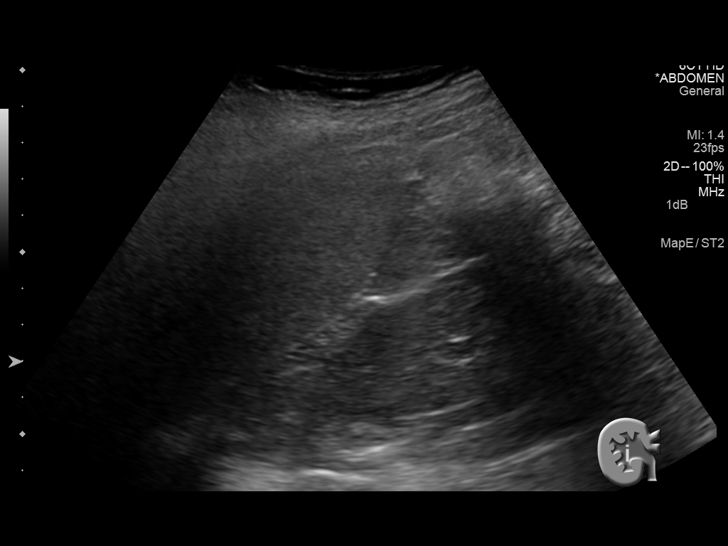
[im 80/96]
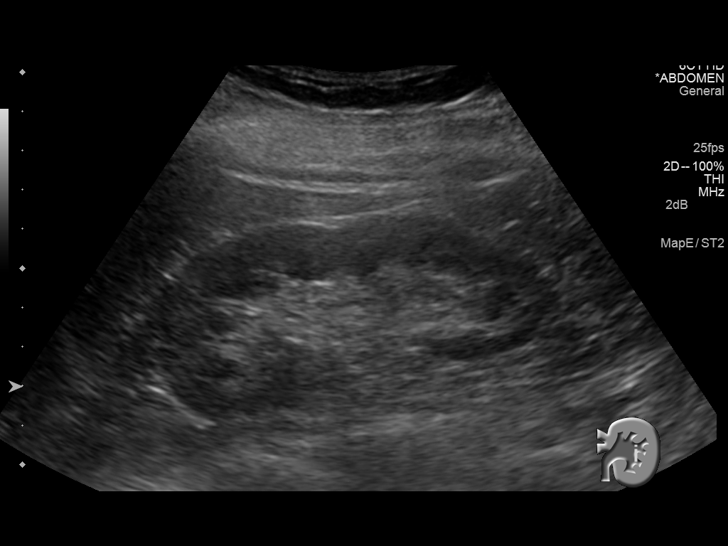
[im 88/96]
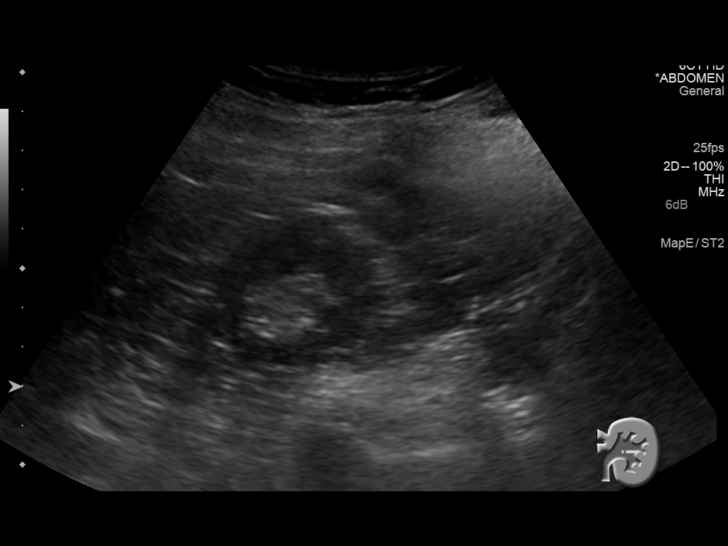
[im 96/96]
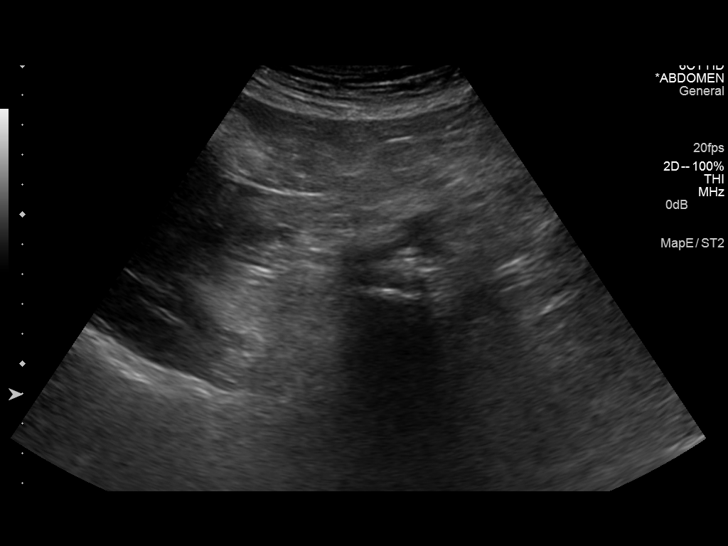

[14 of 25 positions shown; findings below may reference images not displayed]

FINDINGS: Gallbladder: Multiple layering gallstones are noted within
gallbladder the largest measures 6 mm. No thickening of gallbladder
wall. No sonographic Murphy's sign.

Common bile duct: Diameter: 5.8 mm in diameter.

Liver: No focal lesion identified. Within normal limits in
parenchymal echogenicity. No intrahepatic biliary ductal dilatation.

IVC: No abnormality visualized.

Pancreas: Visualized portion unremarkable.

Spleen: Size and appearance within normal limits. Measures 6.2 cm in
length

Right Kidney: Length: 10.2 cm. Echogenicity within normal limits. No
mass or hydronephrosis visualized.

Left Kidney: Length: 10.5 cm. Echogenicity within normal limits. No
mass or hydronephrosis visualized.

Abdominal aorta: No aneurysm visualized. Measures up to 2.3 cm in
diameter.

Other findings: None.
IMPRESSION: 1. Multiple gallstones are noted within gallbladder.
2. No sonographic Murphy's sign.  Normal CBD.
3. No hydronephrosis.
4. No aortic aneurysm.
5. No splenomegaly.

## 2017-01-29 ENCOUNTER — Other Ambulatory Visit: Payer: Self-pay | Admitting: Family Medicine

## 2017-01-29 DIAGNOSIS — L309 Dermatitis, unspecified: Secondary | ICD-10-CM

## 2017-03-12 ENCOUNTER — Encounter: Payer: Self-pay | Admitting: Family Medicine

## 2017-03-12 ENCOUNTER — Ambulatory Visit: Payer: BLUE CROSS/BLUE SHIELD | Admitting: Family Medicine

## 2017-03-12 VITALS — BP 138/76 | HR 79 | Temp 98.1°F | Resp 16 | Ht 60.0 in | Wt 134.4 lb

## 2017-03-12 DIAGNOSIS — E1169 Type 2 diabetes mellitus with other specified complication: Secondary | ICD-10-CM | POA: Diagnosis not present

## 2017-03-12 DIAGNOSIS — E785 Hyperlipidemia, unspecified: Secondary | ICD-10-CM

## 2017-03-12 DIAGNOSIS — E1139 Type 2 diabetes mellitus with other diabetic ophthalmic complication: Secondary | ICD-10-CM | POA: Diagnosis not present

## 2017-03-12 DIAGNOSIS — E1165 Type 2 diabetes mellitus with hyperglycemia: Secondary | ICD-10-CM

## 2017-03-12 DIAGNOSIS — F419 Anxiety disorder, unspecified: Secondary | ICD-10-CM

## 2017-03-12 DIAGNOSIS — I10 Essential (primary) hypertension: Secondary | ICD-10-CM | POA: Diagnosis not present

## 2017-03-12 DIAGNOSIS — IMO0002 Reserved for concepts with insufficient information to code with codable children: Secondary | ICD-10-CM

## 2017-03-12 NOTE — Assessment & Plan Note (Signed)
Well controlled, no changes to meds. Encouraged heart healthy diet such as the DASH diet and exercise as tolerated.  °

## 2017-03-12 NOTE — Assessment & Plan Note (Addendum)
Lab Results  Component Value Date   HGBA1C 6.3 09/12/2016    Has been controlled with meds and diet  Check labs today

## 2017-03-12 NOTE — Progress Notes (Signed)
Patient ID: Dominique Hayes, female    DOB: 03-Jun-1950  Age: 67 y.o. MRN: [id number]    Subjective:  Subjective  HPI Dominique Hayes presents for f/u dm, cholesterol and bp and to have paperwork filled out for immigration.  She has been studying but gets very anxious and forgets the information in english.  This only occurs in times of stress.    Review of Systems  Constitutional: Negative for appetite change, diaphoresis, fatigue and unexpected weight change.  Eyes: Negative for pain, redness and visual disturbance.  Respiratory: Negative for cough, chest tightness, shortness of breath and wheezing.   Cardiovascular: Negative for chest pain, palpitations and leg swelling.  Endocrine: Negative for cold intolerance, heat intolerance, polydipsia, polyphagia and polyuria.  Genitourinary: Negative for difficulty urinating, dysuria and frequency.  Neurological: Negative for dizziness, light-headedness, numbness and headaches.  Psychiatric/Behavioral: Positive for decreased concentration. The patient is nervous/anxious.     History Past Medical History:  Diagnosis Date  . DM (diabetes mellitus) (HCC)   . Hepatitis B   . Hyperlipidemia   . Hypertension   . Mild diastolic dysfunction     She has a past surgical history that includes Abdominal hysterectomy.   Her family history includes Hypertension in her father, mother, sister, and sister.She reports that  has never smoked. she has never used smokeless tobacco. She reports that she does not drink alcohol or use drugs.  Current Outpatient Medications on File Prior to Visit  Medication Sig Dispense Refill  . atorvastatin (LIPITOR) 40 MG tablet Take 1 tablet (40 mg total) by mouth at bedtime. 90 tablet 1  . chlorthalidone (HYGROTON) 25 MG tablet TAKE 1 TABLET BY MOUTH EVERY DAY 90 tablet 3  . [facility] ASPIRIN LOW DOSE 81 MG EC tablet TAKE 1 TABLET BY MOUTH DAILY. 30 tablet 3  . fenofibrate micronized (ANTARA) 130 MG capsule Take 1 capsule (130 mg total)  by mouth daily before breakfast. 270 capsule 3  . glucose blood (FREESTYLE LITE) test strip Check blood sugar once daily Dx:250.00 (Freestyle Lite Meter) 100 each 12  . Lancets (FREESTYLE) lancets Check blood sugar daily Dx: 250.00 (Freestyle Lite Meter) 100 each 12  . losartan (COZAAR) 50 MG tablet Take 1 tablet (50 mg total) by mouth daily. 90 tablet 3  . meloxicam (MOBIC) 15 MG tablet Take 1 tablet (15 mg total) by mouth daily as needed for pain. 90 tablet 0  . metoprolol tartrate (LOPRESSOR) 25 MG tablet Take 1 tablet (25 mg total) by mouth 2 (two) times daily. 180 tablet 1  . mometasone (ELOCON) 0.1 % cream APPLY TO AFFECTED AREA EVERY DAY 135 g 0  . nitroGLYCERIN (NITROSTAT) 0.4 MG SL tablet Place 1 tablet (0.4 mg total) under the tongue every 5 (five) minutes as needed for chest pain. 30 tablet 0  . nystatin cream (MYCOSTATIN) Apply 1 application topically 2 (two) times daily. 30 g 0  . Saxagliptin-Metformin (KOMBIGLYZE XR) 05-998 MG TB24 Take 1 tablet by mouth daily. 90 tablet 1  . valACYclovir (VALTREX) 1000 MG tablet Take 1 tablet (1,000 mg total) by mouth 3 (three) times daily. 21 tablet 2   No current facility-administered medications on file prior to visit.      Objective:  Objective  Physical Exam  Constitutional: She is oriented to person, place, and time. She appears well-developed and well-nourished.  HENT:  Head: Normocephalic and atraumatic.  Eyes: Conjunctivae and EOM are normal.  Neck: Normal range of motion. Neck supple. No JVD present.  Carotid bruit is not present. No thyromegaly present.  Cardiovascular: Normal rate, regular rhythm and normal heart sounds.  No murmur heard. Pulmonary/Chest: Effort normal and breath sounds normal. No respiratory distress. She has no wheezes. She has no rales. She exhibits no tenderness.  Musculoskeletal: She exhibits no edema.  Neurological: She is alert and oriented to person, place, and time.  Psychiatric: Her speech is normal and  behavior is normal. Thought content normal. Her mood appears anxious. Cognition and memory are normal. Cognition and memory are not impaired.  Dominique Hayes forgets information when under stress--- does not do well on exams as a result  Nursing note and vitals reviewed. Sensory exam of the foot is normal, tested with the monofilament. Good pulses, no lesions or ulcers, good peripheral pulses.  BP 138/76 (BP Location: Left Arm, Cuff Size: Normal)   Pulse 79   Temp 98.1 F (36.7 C) (Oral)   Resp 16   Ht 5' (1.524 m)   Wt 134 lb 6.4 oz (61 kg)   SpO2 97%   BMI 26.25 kg/m  Wt Readings from Last 3 Encounters:  03/12/17 134 lb 6.4 oz (61 kg)  09/12/16 137 lb (62.1 kg)  04/03/16 136 lb 6.4 oz (61.9 kg)     Lab Results  Component Value Date   WBC 7.3 04/03/2016   HGB 12.2 04/03/2016   HCT 35.9 (L) 04/03/2016   PLT 268.0 04/03/2016   GLUCOSE 182 (H) 09/12/2016   CHOL 190 09/12/2016   TRIG 329.0 (H) 09/12/2016   HDL 28.80 (L) 09/12/2016   LDLDIRECT 103.0 09/12/2016   LDLCALC 124 (H) 04/03/2016   ALT 30 09/12/2016   AST 32 09/12/2016   NA 137 09/12/2016   K 4.2 09/12/2016   CL 101 09/12/2016   CREATININE 1.10 09/12/2016   BUN 18 09/12/2016   CO2 31 09/12/2016   TSH 1.05 05/26/2013   HGBA1C 6.3 09/12/2016   MICROALBUR <0.7 04/03/2016    Mm Digital Screening Bilateral  Result Date: 04/03/2016 CLINICAL DATA:  Screening. EXAM: DIGITAL SCREENING BILATERAL MAMMOGRAM WITH CAD COMPARISON:  None. ACR Breast Density Category b: There are scattered areas of fibroglandular density. FINDINGS: There are no findings suspicious for malignancy. Images were processed with CAD. IMPRESSION: No mammographic evidence of malignancy. A result letter of this screening mammogram will be mailed directly to the patient. RECOMMENDATION: Screening mammogram in one year. (Code:SM-B-01Y) BI-RADS CATEGORY  1: Negative. Electronically Signed   By: Harmon PierJeffrey  Hu M.D.   On: 04/03/2016 14:18     Assessment & Plan:  Plan  I  am having Dominique T. Laveda Normanran maintain her CVS ASPIRIN LOW DOSE, nystatin cream, freestyle, glucose blood, nitroGLYCERIN, meloxicam, valACYclovir, Saxagliptin-Metformin, metoprolol tartrate, losartan, fenofibrate micronized, atorvastatin, chlorthalidone, and mometasone.  No orders of the defined types were placed in this encounter.   Problem List Items Addressed This Visit      Unprioritized   Anxiety    Comes and goes Paperwork for immigration filled out to try to exempt her from speaking / writing english do to her getting so extremely anxious       DM (diabetes mellitus) type II uncontrolled with eye manifestation (HCC)    Lab Results  Component Value Date   HGBA1C 6.3 09/12/2016    Has been controlled with meds and diet  Check labs today      Essential hypertension    Well controlled, no changes to meds. Encouraged heart healthy diet such as the DASH diet and exercise as tolerated.  Relevant Orders   Lipid panel   Hemoglobin A1c   Comprehensive metabolic panel   Microalbumin / creatinine urine ratio   Hyperlipidemia associated with type 2 diabetes mellitus (HCC) - Primary   Relevant Orders   Lipid panel   Hemoglobin A1c   Comprehensive metabolic panel   Microalbumin / creatinine urine ratio   Hyperlipidemia LDL goal <70    Tolerating statin, encouraged heart healthy diet, avoid trans fats, minimize simple carbs and saturated fats. Increase exercise as tolerated       Other Visit Diagnoses    Controlled type 2 diabetes mellitus with other ophthalmic complication, without long-term current use of insulin (HCC)       Relevant Orders   Hemoglobin A1c   Comprehensive metabolic panel   Microalbumin / creatinine urine ratio      Follow-up: Return in about 6 months (around 09/09/2017), or if symptoms worsen or fail to improve, for annual exam, fasting.  Donato Schultz, DO

## 2017-03-12 NOTE — Patient Instructions (Signed)

## 2017-03-12 NOTE — Assessment & Plan Note (Signed)
Tolerating statin, encouraged heart healthy diet, avoid trans fats, minimize simple carbs and saturated fats. Increase exercise as tolerated 

## 2017-03-12 NOTE — Assessment & Plan Note (Signed)
Comes and goes Paperwork for immigration filled out to try to exempt her from speaking / writing english do to her getting so extremely anxious

## 2017-03-13 LAB — COMPREHENSIVE METABOLIC PANEL
ALT: 77 U/L — ABNORMAL HIGH (ref 0–35)
AST: 62 U/L — AB (ref 0–37)
Albumin: 4.2 g/dL (ref 3.5–5.2)
Alkaline Phosphatase: 50 U/L (ref 39–117)
BUN: 27 mg/dL — ABNORMAL HIGH (ref 6–23)
CALCIUM: 10.6 mg/dL — AB (ref 8.4–10.5)
CHLORIDE: 103 meq/L (ref 96–112)
CO2: 31 meq/L (ref 19–32)
Creatinine, Ser: 1.05 mg/dL (ref 0.40–1.20)
GFR: 55.83 mL/min — AB (ref >60.00)
Glucose, Bld: 143 mg/dL — ABNORMAL HIGH (ref 70–99)
POTASSIUM: 4.5 meq/L (ref 3.5–5.1)
Sodium: 141 mEq/L (ref 135–145)
Total Bilirubin: 0.5 mg/dL (ref 0.2–1.2)
Total Protein: 8.3 g/dL (ref 6.0–8.3)

## 2017-03-13 LAB — LIPID PANEL
CHOL/HDL RATIO: 6
Cholesterol: 194 mg/dL (ref 0–200)
HDL: 33.8 mg/dL — ABNORMAL LOW (ref >39.00)
NONHDL: 159.87
TRIGLYCERIDES: 228 mg/dL — AB (ref 0.0–149.0)
VLDL: 45.6 mg/dL — AB (ref 0.0–40.0)

## 2017-03-13 LAB — HEMOGLOBIN A1C: Hgb A1c MFr Bld: 7 % — ABNORMAL HIGH (ref 4.6–6.5)

## 2017-03-13 LAB — MICROALBUMIN / CREATININE URINE RATIO
CREATININE, U: 127.3 mg/dL
Microalb Creat Ratio: 1.4 mg/g (ref 0.0–30.0)
Microalb, Ur: 1.8 mg/dL (ref 0.0–1.9)

## 2017-03-13 LAB — LDL CHOLESTEROL, DIRECT: LDL DIRECT: 128 mg/dL

## 2017-03-22 ENCOUNTER — Other Ambulatory Visit: Payer: Self-pay | Admitting: *Deleted

## 2017-03-22 DIAGNOSIS — IMO0002 Reserved for concepts with insufficient information to code with codable children: Secondary | ICD-10-CM

## 2017-03-22 DIAGNOSIS — I10 Essential (primary) hypertension: Secondary | ICD-10-CM

## 2017-03-22 DIAGNOSIS — E785 Hyperlipidemia, unspecified: Secondary | ICD-10-CM

## 2017-03-22 DIAGNOSIS — E1165 Type 2 diabetes mellitus with hyperglycemia: Secondary | ICD-10-CM

## 2017-03-22 DIAGNOSIS — E1151 Type 2 diabetes mellitus with diabetic peripheral angiopathy without gangrene: Secondary | ICD-10-CM

## 2017-03-22 MED ORDER — ICOSAPENT ETHYL 1 G PO CAPS: 2.0000 | ORAL_CAPSULE | Freq: Two times a day (BID) | ORAL | 3 refills | Status: DC

## 2017-03-22 MED ORDER — SAXAGLIPTIN-METFORMIN ER 2.5-1000 MG PO TB24: 2.0000 | ORAL_TABLET | Freq: Every day | ORAL | 1 refills | Status: DC

## 2017-04-04 ENCOUNTER — Telehealth: Payer: Self-pay | Admitting: Family Medicine

## 2017-04-04 NOTE — Telephone Encounter (Signed)
Copied from CRM [phone]. Topic: Quick Communication - Rx Refill/Question >> Apr 04, 2017  1:07 PM Alexander BergeronBarksdale, Harvey B wrote: Medication:  Icosapent Ethyl (VASCEPA) 1 g CAPS [[id number]][[id number]]   Pt called to state that this medication is too expensive and that she is needing an alternate medication, contact pt to advise

## 2017-04-04 NOTE — Telephone Encounter (Signed)
See pt. Request. Thanks. 

## 2017-04-05 NOTE — Telephone Encounter (Signed)
She can get mega red otc and take that instead

## 2017-04-06 ENCOUNTER — Telehealth: Payer: Self-pay

## 2017-04-06 NOTE — Telephone Encounter (Signed)
PA initiated via Covermymeds; KEY: [id number]. Awaiting determination.

## 2017-04-09 NOTE — Telephone Encounter (Signed)
PA denied. Per other telephone notes recommended fish oil/krill ol.

## 2017-04-09 NOTE — Telephone Encounter (Signed)
PA denied. Awaiting denial information.  °

## 2017-04-10 MED ORDER — MEGARED OMEGA-3 KRILL OIL 500 MG PO CAPS: 1.0000 | ORAL_CAPSULE | Freq: Every day | ORAL | 2 refills | Status: DC

## 2017-04-10 NOTE — Telephone Encounter (Signed)
rx sent in for mega red.  Patient will pick up.

## 2017-04-18 ENCOUNTER — Telehealth: Payer: Self-pay | Admitting: Family Medicine

## 2017-04-18 NOTE — Telephone Encounter (Signed)
Patient and patient sps walked in and wants a referral to see Terri. Patient sps stts the patient is stress and depress and needs to see someone for help.  Please call patient once referral is in @ [phone]

## 2017-04-19 NOTE — Telephone Encounter (Signed)
She just needs the phone number to make appointment---

## 2017-04-19 NOTE — Telephone Encounter (Signed)
Thank you :)

## 2017-04-19 NOTE — Telephone Encounter (Signed)
Call Patient and provided number off Terri card [phone].

## 2017-04-23 ENCOUNTER — Encounter: Payer: Self-pay | Admitting: Family Medicine

## 2017-04-23 ENCOUNTER — Ambulatory Visit: Payer: BLUE CROSS/BLUE SHIELD | Admitting: Family Medicine

## 2017-04-23 VITALS — BP 130/78 | HR 96 | Temp 98.1°F | Resp 16 | Ht 60.0 in | Wt 132.8 lb

## 2017-04-23 DIAGNOSIS — F322 Major depressive disorder, single episode, severe without psychotic features: Secondary | ICD-10-CM | POA: Insufficient documentation

## 2017-04-23 NOTE — Progress Notes (Signed)
Patient ID: Dominique Hayes, female   DOB: 04/16/1950, 67 y.o.   MRN: [id number]

## 2017-04-23 NOTE — Assessment & Plan Note (Signed)
Pt to see counselor May 15  They wanted to see her sooner but she had nothing rto prn

## 2017-04-23 NOTE — Progress Notes (Signed)
Patient ID: Dominique Hayes, female    DOB: 17-Jul-1950  Age: 67 y.o. MRN: [id number]    Subjective:  Subjective  HPI Dominique Hayes presents to discuss depression---  She was refused [place] citizenship because she can not speak english but if she had a letter from a mental health professional saying she was unable to take the test in english they would allow it.   Review of Systems  Constitutional: Negative for activity change, appetite change, chills, diaphoresis, fatigue, fever and unexpected weight change.  Eyes: Negative for pain, redness and visual disturbance.  Respiratory: Negative for cough, chest tightness, shortness of breath and wheezing.   Cardiovascular: Negative for chest pain, palpitations and leg swelling.  Gastrointestinal: Negative for abdominal distention and abdominal pain.  Endocrine: Negative for cold intolerance, heat intolerance, polydipsia, polyphagia and polyuria.  Genitourinary: Negative for difficulty urinating, dyspareunia, dysuria, flank pain, frequency, genital sores, hematuria, menstrual problem, pelvic pain, vaginal discharge and vaginal pain.  Musculoskeletal: Negative for back pain.  Neurological: Negative for dizziness, light-headedness, numbness and headaches.  Psychiatric/Behavioral: Positive for decreased concentration and dysphoric mood.    History Past Medical History:  Diagnosis Date  . DM (diabetes mellitus) (HCC)   . Hepatitis B   . Hyperlipidemia   . Hypertension   . Mild diastolic dysfunction     She has a past surgical history that includes Abdominal hysterectomy.   Her family history includes Hypertension in her father, mother, sister, and sister.She reports that she has never smoked. She has never used smokeless tobacco. She reports that she does not drink alcohol or use drugs.  Current Outpatient Medications on File Prior to Visit  Medication Sig Dispense Refill  . atorvastatin (LIPITOR) 40 MG tablet Take 1 tablet (40 mg total) by mouth at  bedtime. 90 tablet 1  . chlorthalidone (HYGROTON) 25 MG tablet TAKE 1 TABLET BY MOUTH EVERY DAY 90 tablet 3  . [facility]S ASPIRIN LOW DOSE 81 MG EC tablet TAKE 1 TABLET BY MOUTH DAILY. 30 tablet 3  . fenofibrate micronized (ANTARA) 130 MG capsule Take 1 capsule (130 mg total) by mouth daily before breakfast. 270 capsule 3  . glucose blood (FREESTYLE LITE) test strip Check blood sugar once daily Dx:250.00 (Freestyle Lite Meter) 100 each 12  . Icosapent Ethyl (VASCEPA) 1 g CAPS Take 2 capsules (2 g total) by mouth 2 (two) times daily. 120 capsule 3  . Lancets (FREESTYLE) lancets Check blood sugar daily Dx: 250.00 (Freestyle Lite Meter) 100 each 12  . losartan (COZAAR) 50 MG tablet Take 1 tablet (50 mg total) by mouth daily. 90 tablet 3  . MEGARED OMEGA-3 KRILL OIL 500 MG CAPS Take 1 capsule by mouth daily. 30 capsule 2  . meloxicam (MOBIC) 15 MG tablet Take 1 tablet (15 mg total) by mouth daily as needed for pain. 90 tablet 0  . metoprolol tartrate (LOPRESSOR) 25 MG tablet Take 1 tablet (25 mg total) by mouth 2 (two) times daily. 180 tablet 1  . mometasone (ELOCON) 0.1 % cream APPLY TO AFFECTED AREA EVERY DAY 135 g 0  . nitroGLYCERIN (NITROSTAT) 0.4 MG SL tablet Place 1 tablet (0.4 mg total) under the tongue every 5 (five) minutes as needed for chest pain. 30 tablet 0  . nystatin cream (MYCOSTATIN) Apply 1 application topically 2 (two) times daily. 30 g 0  . Saxagliptin-Metformin 2.05-998 MG TB24 Take 2 tablets by mouth daily. 180 tablet 1  . valACYclovir (VALTREX) 1000 MG tablet Take 1 tablet (1,000 mg  total) by mouth 3 (three) times daily. 21 tablet 2   No current facility-administered medications on file prior to visit.      Objective:  Objective  Physical Exam  Psychiatric: Her speech is normal. Judgment and thought content normal. Her affect is blunt. She is slowed. Cognition and memory are normal. She exhibits a depressed mood.   BP 130/78 (BP Location: Left Arm, Cuff Size: Normal)   Pulse 96    Temp 98.1 F (36.7 C) (Oral)   Resp 16   Ht 5' (1.524 m)   Wt 132 lb 12.8 oz (60.2 kg)   SpO2 98%   BMI 25.94 kg/m  Wt Readings from Last 3 Encounters:  04/23/17 132 lb 12.8 oz (60.2 kg)  03/12/17 134 lb 6.4 oz (61 kg)  09/12/16 137 lb (62.1 kg)     Lab Results  Component Value Date   WBC 7.3 04/03/2016   HGB 12.2 04/03/2016   HCT 35.9 (L) 04/03/2016   PLT 268.0 04/03/2016   GLUCOSE 143 (H) 03/12/2017   CHOL 194 03/12/2017   TRIG 228.0 (H) 03/12/2017   HDL 33.80 (L) 03/12/2017   LDLDIRECT 128.0 03/12/2017   LDLCALC 124 (H) 04/03/2016   ALT 77 (H) 03/12/2017   AST 62 (H) 03/12/2017   NA 141 03/12/2017   K 4.5 03/12/2017   CL 103 03/12/2017   CREATININE 1.05 03/12/2017   BUN 27 (H) 03/12/2017   CO2 31 03/12/2017   TSH 1.05 05/26/2013   HGBA1C 7.0 (H) 03/12/2017   MICROALBUR 1.8 03/12/2017    Mm Digital Screening Bilateral  Result Date: 04/03/2016 CLINICAL DATA:  Screening. EXAM: DIGITAL SCREENING BILATERAL MAMMOGRAM WITH CAD COMPARISON:  None. ACR Breast Density Category b: There are scattered areas of fibroglandular density. FINDINGS: There are no findings suspicious for malignancy. Images were processed with CAD. IMPRESSION: No mammographic evidence of malignancy. A result letter of this screening mammogram will be mailed directly to the patient. RECOMMENDATION: Screening mammogram in one year. (Code:SM-B-01Y) BI-RADS CATEGORY  1: Negative. Electronically Signed   By: Harmon PierJeffrey  Hu M.D.   On: 04/03/2016 14:18     Assessment & Plan:  Plan  I am having Dominique Hayes maintain her CVS ASPIRIN LOW DOSE, nystatin cream, freestyle, glucose blood, nitroGLYCERIN, meloxicam, valACYclovir, metoprolol tartrate, losartan, fenofibrate micronized, atorvastatin, chlorthalidone, mometasone, Saxagliptin-Metformin, Icosapent Ethyl, and MEGARED OMEGA-3 KRILL OIL.  No orders of the defined types were placed in this encounter.   Problem List Items Addressed This Visit      Unprioritized    Depression, major, single episode, severe (HCC) - Primary    Pt to see counselor May 15  They wanted to see her sooner but she had nothing rto prn         Follow-up: Return in about 3 months (around 07/23/2017), or if symptoms worsen or fail to improve.  Donato SchultzYvonne R Lowne Chase, DO

## 2017-04-23 NOTE — Patient Instructions (Signed)
S?ng cng v?i tr?m c?m Living With Depression M?i ng??i ??u th?nh tho?ng tr?i qua s? th?t v?ng, n?i bu?n v m?t mt trong cu?c s?ng. Khi qu v? c?m th?y th?t v?ng, chn n?n hay bu?n b trong t nh?t 2 tu?n lin ti?p, ?i?u ? c th? c ngh?a l qu v? b? tr?m c?m. Tr?m c?m c th? ?nh h??ng ??n suy ngh?, c?m xc, m?i quan h?, cc sinh ho?t hng ngy v s?c kh?e th? ch?t c?a qu v?. Nguyn nhn l do cc thay ??i v? cch th?c ho?t ??ng c?a no qu v?. N?u qu v? nh?n ???c ch?n ?on l b? tr?m c?m, chuyn gia ch?m [place] s?c kh?e s? cho bi?t qu v? b? lo?i tr?m c?m no v c cc ph??ng n ?i?u tr? cho qu v?. N?u ?ang s?ng cng tr?m c?m, c nhi?u cch gip qu v? h?i ph?c v c?ng c nhi?u cch ng?n tr?m c?m quay tr? l?i. Cch ??i ph v?i cc thay ??i l?i s?ng ??i ph v?i c?ng th?ng  C?ng th?ng l ph?n ?ng c?a c? th? v?i cc thay ??i v bi?n c? trong cu?c s?ng, c? t?t l?n x?u. Cc tnh hu?ng c?ng th?ng c th? bao g?m:  K?t hn.  M?t b?n ??i.  M?t vi?c.  Ngh? vi?c.  C con.  C?ng th?ng c th? ko di ch? m?t vi gi? ho?c c th? ti?n tri?n. C?ng th?ng c th? ?ng vai tr quan tr?ng trong tr?m c?m, v v?y ?i?u quan tr?ng l tm hi?u c? cch ??i ph v?i c?ng th?ng v cch suy ngh? khc v? c?ng th?ng. Hy trao ??i v?i chuyn gia ch?m  s?c kh?e ho?c nh t? v?n n?u qu v? mu?n tm hi?u thm v? cch lm gi?m c?ng th?ng. V? ? c th? g?i  m?t s? k? thu?t gi?m c?ng th?ng, ch?ng h?n nh?:  Li?u php m nh?c. ?i?u ny c th? bao g?m so?n nh?c ho?c nghe nh?c. Ch?n th? lo?i nh?c m qu v? yu thch v truy?n c?m h?ng cho qu v?.  Thi?n chnh ni?m. Ph??ng php thi?n ny c th? ???c th?c hi?n trong khi ng?i ho?c ?i b?. Thi?n chnh ni?m ?i h?i nh?n th?c cc h?i th? bnh th??ng, ch? khng ph?i l c? g?ng ki?m sot h?i th?.  C?u nguy?n t?p trung. ?y l m?t d?ng thi?n ?i h?i t?p trung vo m?t t? ho?c c?m t? tm linh. Ch?n m?t t?, c?m t? ho?c hnh ?nh linh thing c  ngh?a v?i qu v? v mang l?i s? an bnh cho  qu v?.  Th? su. ?? lm ?i?u ny, hy m? r?ng d? dy v t? t? ht vo qua m?i c?a qu v?. Gi? h?i th? trong 3-5 giy, sau ? t? t? th? ra, ?? cho c? d? dy th? l?ng.  Th? gin c? b?p. ?i?u ny ??i h?i vi?c c?  c?ng c? sau ? th? l?ng chng.  Hy ch?n m?t k? thu?t gi?m c?ng th?ng ph h?p v?i l?i s?ng v tnh cch c?a qu v?. Cc k? thu?t gi?m c?ng th?ng c th? m?t th?i gian v th?c hnh ?? pht tri?n. Hy dnh ra 5-15 pht m?t ngy ?? th?c hi?n chng. Bc s? tr? li?u c th? hu?n luy?n nh?ng k? thu?t ny. M?t s? ch??ng trnh b?o hi?m c th? bao tr? cho ho?t ??ng hu?n luy?n. Nh?ng vi?c khc qu v? c th? lm ?? qu?n l c?ng th?ng bao g?m:  Gi? m?t cu?n nh?t k ghi l?i c?ng th?ng. Vi?c  ny c th? gip qu v? bi?t nh?ng g gy kh?i pht c?ng th?ng v cch ki?m sot ph?n ?ng c?a b?n thn.  Hi?u v? gi?i h?n c?a mnh v ni khng v?i cc yu c?u ho?c s? ki?n d?n ??n l?ch trnh qu dy ??c.  Suy ngh? v? cch qu v? ph?n ?ng v?i m?t s? tnh hu?ng nh?t ??nh. Qu v? khng th? ki?m sot ???c m?i th?, nh?ng c th? ki?m sot ???c ph?n ?ng c?a b?n thn.  T?ng thm s? vui nh?n vo cu?c s?ng b?ng cch xem cc phim hi ho?c ch??ng trnh truy?n hnh hi h??c.  Dnh th?i gian cho cc ho?t ??ng gip qu v? th? gin v khng c?m th?y t?i l?i v? vi?c tiu t?n th?i gian theo cch ny.  Thu?c Chu[place] gia ch?m [place] s?c kh?e c th? ?? xu?t m?t s? lo?i thu?c nh?t ??nh n?u h? c?m th?y thu?c ? s? gip c?i thi?n tnh tr?ng c?a qu v?. Trnh s? d?ng r??u v cc ch?t khc c th? ng?n c?n tc d?ng ph h?p c?a thu?c (c th? t??ng tc). ?i?u c?ng quan tr?ng l:  Trao ??i v?i d??c s? ho?c chuyn gia ch?m [place] s?c kh?e v? t?t c? cc lo?i thu?c m qu v? dng, cc tc d?ng ph? c th? x?y ra v nh?ng thu?c no an ton khi u?ng cng nhau.  ??t m?c tiu tham gia vo t?t c? cc quy?t ??nh ?i?u tr? (ra quy?t ??nh chung). ?i?u ny bao g?m ??a ra  ki?n v? cc tc d?ng ph? c?a thu?c. T?t nh?t l n?u ra quy?t ??nh chung v?i chuyn gia ch?m  [place] s?c kh?e l m?t ph?n trong k? ho?ch ?i?u tr? t?ng th? c?a qu v?.  N?u chuyn gia ch?m [place] s?c kh?e k ??n m?t lo?i thu?c, qu v? c th? khng nh?n th?y ton b? l?i ch c?a thu?c ? trong 4-8 tu?n. H?u h?t m?i ng??i ???c ?i?u tr? tr?m c?m c?n dng thu?c trong t nh?t 6-12 thng sau khi h? c?m th?y ?? h?n. N?u qu v? ?ang dng thu?c nh? m?t ph?n c?a k? ho?ch ?i?u tr?, khng d?ng dng thu?c m khng ni tr??c v?i chuyn gia ch?m [place] s?c kh?e. Qu v? c th? c?n gi?m l??ng thu?c t? t? (gi?m d?n li?u) theo th?i gian ?? gi?m nguy c? b? cc tc d?ng ph? c h?i. Cc m?i quan h? Chuyn gia ch?m [place] s?c kh?e c th? ?? xu?t li?u php gia ?nh cng v?i li?u php c nhn v li?u php thu?c. M?c d c th? khng ph?i cc v?n ?? gia ?nh khi?n qu v? c?m th?y chn n?n, nh?ng ??m b?o vi?c gia ?nh bi?t cng nhi?u cng t?t tnh tr?ng s?c kh?e tm th?n c?a qu v? c?ng r?t quan tr?ng. ???c s? h? tr? c?a gia ?nh c th? gip qu v? ?i?u tr? thnh cng. Cch nh?n bi?t cc thay ??i tnh tr?ng c?a qu v? M?i ng??i c ?p ?ng khc nhau v?i ?i?u tr? tr?m c?m. S? h?i ph?c c?a tr?m c?m n?ng x?y ra khi qu v? khng c cc d?u hi?u tr?m c?m n?ng trong hai thng. ?i?u ny c th? ngh?a l qu v? s? b?t ??u:  Quan tm h?n trong vi?c th?c hi?n cc ho?t ??ng.  C?m th?y t tuy?t v?ng h?n 2 thng tr??c.  C nhi?u n?ng l??ng h?n.  t khi ?n qu nhi?u ho?c c?m th?y ?n ngon mi?ng h?n ho?c c?i thi?n c?m gic ngon mi?ng.  T?p trung t?t  h?n.  Chuyn gia ch?m [place] s?c kh?e s? lm vi?c v?i qu v? ?? quy?t ??nh cc b??c ti?p theo trong qu trnh h?i ph?c c?a qu v?. Vi?c nh?n bi?t khi no tnh tr?ng c?a qu v? tr? nn tr?m tr?ng h?n c?ng c vai tr quan tr?ng. Hy theo di nh?ng d?u hi?u sau:  M?t mo?i ho??c n?ng l???ng th?p.  ?n qua? nhi?u ho??c qua? i?t.  Ngu? qua? nhi?u ho??c qua? i?t.  C?m th?y b?n ch?n, kch ??ng ho?c tuy?t v?ng.  C v?n ?? v?i vi?c t?p trung ho?c ra quy?t ??nh.  C b?nh th? ch?t khng r nguyn  nhn.  C?m th?y kch thch, t?c gi?n ho?c hung h?ng.  Yu c?u tr? gip cng s?m cng t?t n?u qu v? ho?c cc thnh vin gia ?nh nh?n th?y nh?ng tri?u ch?ng ny quay tr? l?i. Lm cch no ?? c ???c s? h? tr? v gip ?? t? nh?ng ng??i khc Lm cch no ?? trao ??i v?i b?n b v ng??i trong gia ?nh v? tnh tr?ng c?a qu v? Ni v?i b?n b v ng??i trong gia ?nh v? tnh tr?ng c?a qu v? c th? cho qu v? m?t cch c ???c s? h? tr? v h??ng d?n. Hy g?p nh?ng ng??i b?n ho?c ng??i trong gia ?nh tin c?y, gi?i thch cc tri?u ch?ng c?a b?n v?i h? v cho h? bi?t r?ng qu v? ?ang lm vi?c v?i m?t chuyn gia ch?m [place] s?c kh?e ?? ?i?u tr? tr?m c?m. Cc ngu?n l?c ti chnh Khng ph?i t?t c? cc ch??ng trnh b?o hi?m ??u bao tr? chi ph ch?m [place] s?c kh?e tm th?n, do v?y ?i?u quan tr?ng l ki?m tra v?i hng b?o hi?m c?a qu v?. N?u chi tr? cho cc kho?n ??ng thanh ton ho?c d?ch v? t? v?n l m?t v?n ??, hy tm ki?m m?t trung tm ch?m [place] s?c kh?e tm th?n t?i ??a ph??ng ho?c t?i qu?n. H? c th? cung c?p cc d?ch v? ch?m [place] s?c kh?e tm th?n cng c?ng v?i chi ph th?p ho?c mi?n ph khi qu v? khng th? g?p chuyn gia ch?m [place] s?c kh?e t? nhn. N?u ?ang dng thu?c ?i?u tr? tr?m c?m, qu v? c th? mua d?ng thu?c g?c, lo?i thu?c ? c th? r? h?n. M?t s? nh s?n xu?t thu?c k ??n c?ng c ch??ng trnh tr? gip nh?ng b?nh nhn khng th? chi tr? ti?n thu?c h? c?n. Tun th? nh?ng h??ng d?n ny ? nh:  Ng? ?? gi?c v ch?t l??ng.  Gi?m b?t vi?c s? d?ng caffeine, thu?c l, r??u v cc ch?t khc c th? c h?i.  C? g?ng t?p th? d?c, ch?ng h?n nh? ?i b? ho?c nng t? nh?.  Ch? s? d?ng thu?c khng k ??n v thu?c k ??n theo ch? d?n c?a chuyn gia ch?m [place] s?c kh?e.  ?n ch? ?? ?n lnh m?nh bao g?m nhi?u rau c?, tri cy, ng? c?c nguyn cm, cc s?n ph?m s?a t bo v protein c?a thi?t n?c. Khng ?n nhi?u th?c ph?m c ch?a nhi?u ch?t bo r?n, b? sung ???ng ho?c mu?i.  Tun th? t?t c? cc l?n khm theo di theo ch? d?n  c?a chuyn gia ch?m [place] s?c kh?e. ?i?u ny c vai tr quan tr?ng. Hy lin l?c v?i chuyn gia ch?m  s?c kh?e n?u:  Qu v? d?ng dng cc thu?c ch?ng tr?m c?m v qu v? c b?t k? tri?u ch?ng no sau ?y: ? Bu?n nn. ? ?au ??u. ?  C?m th?y chong vng. ? ?n l?nh v ?au nh?c ton thn. ? Khng th? ng? ???c (m?t ng?).  Qu v? ho?c b?n b v gia ?nh ngh? r?ng b?nh tr?m c?m c?a qu v? ?ang tr?m tr?ng h?n. Yu c?u tr? gip ngay l?p t?c n?u:  Qu v? c suy ngh? v? vi?c t? lm t?n th??ng mnh ho?c ng??i khc. N?u qu v? t?ng c?m th?y nh? c th? t? lm t?n th??ng b?n thn ho?c ng??i khc, ho?c c suy ngh? v? vi?c t? t?, hy yu c?u s? tr? gip ngay l?p t?c. Qu v? c th? ??n phng c?p c?u g?n nh?t ho?c g?i cho:  D?ch v? c?p c?u t?i ??a ph??ng ([address] ? [place]  ???ng dy tr? gip kh?ng ho?ng t? t?, nh? l ???ng dy C?u sinh Qu?c gia Ng?n ch?n T? t? theo s? [phone]. ???ng dy ny m? 24 gi? m?t ngy.  Tm t?t  N?u ?ang s?ng cng tr?m c?m, c nhi?u cch gip qu v? h?i ph?c v c?ng c nhi?u cch ng?n tr?m c?m quay tr? l?i.  Hy lm vi?c v?i nhm ch?m [place] s?c kh?e c?a qu v? ?? l?p m?t k? ho?ch qu?n l bao g?m t? v?n, cc k? thu?t qu?n l c?ng th?ng v cc thi quen c?a l?i s?ng lnh m?nh. Thng tin ny khng nh?m m?c ?ch thay th? cho l?i khuyn m chuyn gia ch?m [place] s?c kh?e ni v?i qu v?. Hy b?o ??m qu v? ph?i th?o lu?n b?t k? v?n ?? g m qu v? c v?i chuyn gia ch?m [place] s?c kh?e c?a qu v?. Document Released: 04/20/2016 Document Revised: 04/20/2016 Document Reviewed: 04/20/2016 Elsevier Interactive Patient Education  2018 [employer].

## 2017-04-27 ENCOUNTER — Ambulatory Visit: Payer: BLUE CROSS/BLUE SHIELD | Admitting: Psychology

## 2017-05-29 ENCOUNTER — Ambulatory Visit (INDEPENDENT_AMBULATORY_CARE_PROVIDER_SITE_OTHER): Payer: BLUE CROSS/BLUE SHIELD | Admitting: Psychology

## 2017-05-29 DIAGNOSIS — F331 Major depressive disorder, recurrent, moderate: Secondary | ICD-10-CM | POA: Diagnosis not present

## 2017-05-29 DIAGNOSIS — F411 Generalized anxiety disorder: Secondary | ICD-10-CM

## 2017-07-23 ENCOUNTER — Ambulatory Visit (INDEPENDENT_AMBULATORY_CARE_PROVIDER_SITE_OTHER): Payer: Medicare HMO | Admitting: Psychology

## 2017-07-23 DIAGNOSIS — R69 Illness, unspecified: Secondary | ICD-10-CM | POA: Diagnosis not present

## 2017-07-23 DIAGNOSIS — F411 Generalized anxiety disorder: Secondary | ICD-10-CM

## 2017-07-23 DIAGNOSIS — F331 Major depressive disorder, recurrent, moderate: Secondary | ICD-10-CM | POA: Diagnosis not present

## 2017-09-12 DIAGNOSIS — R69 Illness, unspecified: Secondary | ICD-10-CM | POA: Diagnosis not present

## 2017-09-18 ENCOUNTER — Encounter: Payer: Self-pay | Admitting: *Deleted

## 2017-10-01 ENCOUNTER — Ambulatory Visit: Payer: Medicare HMO | Admitting: Psychology

## 2017-10-15 ENCOUNTER — Ambulatory Visit (INDEPENDENT_AMBULATORY_CARE_PROVIDER_SITE_OTHER): Payer: Medicare HMO | Admitting: Family Medicine

## 2017-10-15 ENCOUNTER — Encounter: Payer: Self-pay | Admitting: Family Medicine

## 2017-10-15 VITALS — BP 157/82 | HR 64 | Temp 98.9°F | Resp 16 | Ht 60.0 in | Wt 130.4 lb

## 2017-10-15 DIAGNOSIS — M8949 Other hypertrophic osteoarthropathy, multiple sites: Secondary | ICD-10-CM

## 2017-10-15 DIAGNOSIS — E1151 Type 2 diabetes mellitus with diabetic peripheral angiopathy without gangrene: Secondary | ICD-10-CM | POA: Diagnosis not present

## 2017-10-15 DIAGNOSIS — M159 Polyosteoarthritis, unspecified: Secondary | ICD-10-CM

## 2017-10-15 DIAGNOSIS — I10 Essential (primary) hypertension: Secondary | ICD-10-CM

## 2017-10-15 DIAGNOSIS — E1165 Type 2 diabetes mellitus with hyperglycemia: Secondary | ICD-10-CM

## 2017-10-15 DIAGNOSIS — I25700 Atherosclerosis of coronary artery bypass graft(s), unspecified, with unstable angina pectoris: Secondary | ICD-10-CM | POA: Diagnosis not present

## 2017-10-15 DIAGNOSIS — M150 Primary generalized (osteo)arthritis: Secondary | ICD-10-CM | POA: Diagnosis not present

## 2017-10-15 DIAGNOSIS — IMO0002 Reserved for concepts with insufficient information to code with codable children: Secondary | ICD-10-CM

## 2017-10-15 DIAGNOSIS — E1169 Type 2 diabetes mellitus with other specified complication: Secondary | ICD-10-CM

## 2017-10-15 DIAGNOSIS — E785 Hyperlipidemia, unspecified: Secondary | ICD-10-CM

## 2017-10-15 LAB — LIPID PANEL
CHOLESTEROL: 198 mg/dL (ref 0–200)
HDL: 37.2 mg/dL — AB (ref >39.00)
NonHDL: 161.19
Total CHOL/HDL Ratio: 5
Triglycerides: 209 mg/dL — ABNORMAL HIGH (ref 0.0–149.0)
VLDL: 41.8 mg/dL — AB (ref 0.0–40.0)

## 2017-10-15 LAB — COMPREHENSIVE METABOLIC PANEL
ALK PHOS: 43 U/L (ref 39–117)
ALT: 15 U/L (ref 0–35)
AST: 20 U/L (ref 0–37)
Albumin: 4.3 g/dL (ref 3.5–5.2)
BUN: 15 mg/dL (ref 6–23)
CHLORIDE: 106 meq/L (ref 96–112)
CO2: 26 mEq/L (ref 19–32)
Calcium: 9.6 mg/dL (ref 8.4–10.5)
Creatinine, Ser: 1.03 mg/dL (ref 0.40–1.20)
GFR: 56.98 mL/min — ABNORMAL LOW (ref >60.00)
GLUCOSE: 87 mg/dL (ref 70–99)
POTASSIUM: 5.6 meq/L — AB (ref 3.5–5.1)
Sodium: 139 mEq/L (ref 135–145)
TOTAL PROTEIN: 7.2 g/dL (ref 6.0–8.3)
Total Bilirubin: 0.8 mg/dL (ref 0.2–1.2)

## 2017-10-15 LAB — LDL CHOLESTEROL, DIRECT: LDL DIRECT: 140 mg/dL

## 2017-10-15 LAB — HEMOGLOBIN A1C: HEMOGLOBIN A1C: 6.2 % (ref 4.6–6.5)

## 2017-10-15 MED ORDER — METOPROLOL TARTRATE 25 MG PO TABS: 25.0000 mg | ORAL_TABLET | Freq: Two times a day (BID) | ORAL | 1 refills | Status: DC

## 2017-10-15 MED ORDER — BLOOD GLUCOSE MONITOR KIT: PACK | 0 refills | Status: DC

## 2017-10-15 MED ORDER — SAXAGLIPTIN-METFORMIN ER 2.5-1000 MG PO TB24: 2.0000 | ORAL_TABLET | Freq: Every day | ORAL | 1 refills | Status: DC

## 2017-10-15 MED ORDER — MELOXICAM 15 MG PO TABS: 15.0000 mg | ORAL_TABLET | Freq: Every day | ORAL | 0 refills | Status: DC | PRN

## 2017-10-15 MED ORDER — MEGARED OMEGA-3 KRILL OIL 500 MG PO CAPS: 1.0000 | ORAL_CAPSULE | Freq: Every day | ORAL | 2 refills | Status: DC

## 2017-10-15 MED ORDER — NITROGLYCERIN 0.4 MG SL SUBL: 0.4000 mg | SUBLINGUAL_TABLET | SUBLINGUAL | 0 refills | Status: DC | PRN

## 2017-10-15 MED ORDER — ATORVASTATIN CALCIUM 40 MG PO TABS: 40.0000 mg | ORAL_TABLET | Freq: Every day | ORAL | 1 refills | Status: DC

## 2017-10-15 MED ORDER — LOSARTAN POTASSIUM 50 MG PO TABS: 50.0000 mg | ORAL_TABLET | Freq: Every day | ORAL | 3 refills | Status: DC

## 2017-10-15 MED ORDER — FENOFIBRATE MICRONIZED 130 MG PO CAPS: 130.0000 mg | ORAL_CAPSULE | Freq: Every day | ORAL | 3 refills | Status: DC

## 2017-10-15 MED ORDER — CHLORTHALIDONE 25 MG PO TABS: 25.0000 mg | ORAL_TABLET | Freq: Every day | ORAL | 3 refills | Status: DC

## 2017-10-15 MED ORDER — ICOSAPENT ETHYL 1 G PO CAPS: 2.0000 | ORAL_CAPSULE | Freq: Two times a day (BID) | ORAL | 3 refills | Status: DC

## 2017-10-15 NOTE — Assessment & Plan Note (Signed)
Well controlled, no changes to meds. Encouraged heart healthy diet such as the DASH diet and exercise as tolerated.  °

## 2017-10-15 NOTE — Assessment & Plan Note (Signed)
Encouraged heart healthy diet, increase exercise, avoid trans fats, consider a krill oil cap daily 

## 2017-10-15 NOTE — Progress Notes (Signed)
Patient ID: Dominique Hayes, female    DOB: [birth year]  Age: 67 y.o. MRN: [id number]    Subjective:  Subjective  HPI Dominique Hayes presents for f/u dm, cholesterol and bp.  She has no complaints.  She also wants a flu shot.     Review of Systems  Constitutional: Negative for chills and fever.  HENT: Negative for congestion and hearing loss.   Eyes: Negative for discharge.  Respiratory: Negative for cough and shortness of breath.   Cardiovascular: Negative for chest pain, palpitations and leg swelling.  Gastrointestinal: Negative for abdominal pain, blood in stool, constipation, diarrhea, nausea and vomiting.  Genitourinary: Negative for dysuria, frequency, hematuria and urgency.  Musculoskeletal: Negative for back pain and myalgias.  Skin: Negative for rash.  Allergic/Immunologic: Negative for environmental allergies.  Neurological: Negative for dizziness, weakness and headaches.  Hematological: Does not bruise/bleed easily.  Psychiatric/Behavioral: Negative for suicidal ideas. The patient is not nervous/anxious.     History Past Medical History:  Diagnosis Date  . DM (diabetes mellitus) ([place])   . Hepatitis B   . Hyperlipidemia   . Hypertension   . Mild diastolic dysfunction     She has a past surgical history that includes Abdominal hysterectomy.   Her family history includes Hypertension in her father, mother, sister, and sister.She reports that she has never smoked. She has never used smokeless tobacco. She reports that she does not drink alcohol or use drugs.  Current Outpatient Medications on File Prior to Visit  Medication Sig Dispense Refill  . [facility] ASPIRIN LOW DOSE 81 MG EC tablet TAKE 1 TABLET BY MOUTH DAILY. 30 tablet 3  . glucose blood (FREESTYLE LITE) test strip Check blood sugar once daily Dx:250.00 (Freestyle Lite Meter) 100 each 12  . Lancets (FREESTYLE) lancets Check blood sugar daily Dx: 250.00 (Freestyle Lite Meter) 100 each 12  . mometasone (ELOCON) 0.1 % cream  APPLY TO AFFECTED AREA EVERY DAY 135 g 0  . nystatin cream (MYCOSTATIN) Apply 1 application topically 2 (two) times daily. 30 g 0  . valACYclovir (VALTREX) 1000 MG tablet Take 1 tablet (1,000 mg total) by mouth 3 (three) times daily. 21 tablet 2   No current facility-administered medications on file prior to visit.      Objective:  Objective  Physical Exam  Constitutional: She is oriented to person, place, and time. She appears well-developed and well-nourished.  HENT:  Head: Normocephalic and atraumatic.  Eyes: Conjunctivae and EOM are normal.  Neck: Normal range of motion. Neck supple. No JVD present. Carotid bruit is not present. No thyromegaly present.  Cardiovascular: Normal rate, regular rhythm and normal heart sounds.  No murmur heard. Pulmonary/Chest: Effort normal and breath sounds normal. No respiratory distress. She has no wheezes. She has no rales. She exhibits no tenderness.  Genitourinary: Rectal exam shows guaiac negative stool.  Musculoskeletal: She exhibits no edema.  Neurological: She is alert and oriented to person, place, and time.  Psychiatric: She has a normal mood and affect.  Nursing note and vitals reviewed.   Diabetic Foot Exam - Simple   No data filed      BP (!) 157/82   Pulse 64   Temp 98.9 F (37.2 C) (Oral)   Resp 16   Ht 5' (1.524 m)   Wt 130 lb 6.4 oz (59.1 kg)   SpO2 100%   BMI 25.47 kg/m  Wt Readings from Last 3 Encounters:  10/15/17 130 lb 6.4 oz (59.1 kg)  04/23/17 132 lb  12.8 oz (60.2 kg)  03/12/17 134 lb 6.4 oz (61 kg)     Lab Results  Component Value Date   WBC 7.3 04/03/2016   HGB 12.2 04/03/2016   HCT 35.9 (L) 04/03/2016   PLT 268.0 04/03/2016   GLUCOSE 87 10/15/2017   CHOL 198 10/15/2017   TRIG 209.0 (H) 10/15/2017   HDL 37.20 (L) 10/15/2017   LDLDIRECT 140.0 10/15/2017   LDLCALC 124 (H) 04/03/2016   ALT 15 10/15/2017   AST 20 10/15/2017   NA 139 10/15/2017   K 5.6 (H) 10/15/2017   CL 106 10/15/2017   CREATININE  1.03 10/15/2017   BUN 15 10/15/2017   CO2 26 10/15/2017   TSH 1.05 05/26/2013   HGBA1C 6.2 10/15/2017   MICROALBUR 1.8 03/12/2017    Mm Digital Screening Bilateral  Result Date: 04/03/2016 CLINICAL DATA:  Screening. EXAM: DIGITAL SCREENING BILATERAL MAMMOGRAM WITH CAD COMPARISON:  None. ACR Breast Density Category b: There are scattered areas of fibroglandular density. FINDINGS: There are no findings suspicious for malignancy. Images were processed with CAD. IMPRESSION: No mammographic evidence of malignancy. A result letter of this screening mammogram will be mailed directly to the patient. RECOMMENDATION: Screening mammogram in one year. (Code:SM-B-01Y) BI-RADS CATEGORY  1: Negative. Electronically Signed   By: Margarette Canada M.D.   On: 04/03/2016 14:18     Assessment & Plan:  Plan  I have changed Dominique Hayes chlorthalidone. I am also having her start on blood glucose meter kit and supplies. Additionally, I am having her maintain her [facility]S ASPIRIN LOW DOSE, nystatin cream, freestyle, glucose blood, valACYclovir, mometasone, Saxagliptin-Metformin, nitroGLYCERIN, metoprolol tartrate, MEGARED OMEGA-3 KRILL OIL, meloxicam, losartan, Icosapent Ethyl, fenofibrate micronized, and atorvastatin.  Meds ordered this encounter  Medications  . Saxagliptin-Metformin 2.05-998 MG TB24    Sig: Take 2 tablets by mouth daily.    Dispense:  180 tablet    Refill:  1  . nitroGLYCERIN (NITROSTAT) 0.4 MG SL tablet    Sig: Place 1 tablet (0.4 mg total) under the tongue every 5 (five) minutes as needed for chest pain.    Dispense:  30 tablet    Refill:  0  . metoprolol tartrate (LOPRESSOR) 25 MG tablet    Sig: Take 1 tablet (25 mg total) by mouth 2 (two) times daily.    Dispense:  180 tablet    Refill:  1  . MEGARED OMEGA-3 KRILL OIL 500 MG CAPS    Sig: Take 1 capsule by mouth daily.    Dispense:  30 capsule    Refill:  2  . meloxicam (MOBIC) 15 MG tablet    Sig: Take 1 tablet (15 mg total) by mouth daily  as needed for pain.    Dispense:  90 tablet    Refill:  0    [place] LABEL IF AVAILABLE  . losartan (COZAAR) 50 MG tablet    Sig: Take 1 tablet (50 mg total) by mouth daily.    Dispense:  90 tablet    Refill:  3    PLEASE REFILL ANY OTHER BLOOD PRESSURE MEDICATIONS ( POSSIBLY CHLORTHALIDONE)  . Icosapent Ethyl (VASCEPA) 1 g CAPS    Sig: Take 2 capsules (2 g total) by mouth 2 (two) times daily.    Dispense:  120 capsule    Refill:  3  . fenofibrate micronized (ANTARA) 130 MG capsule    Sig: Take 1 capsule (130 mg total) by mouth daily before breakfast.    Dispense:  270 capsule  Refill:  3  . chlorthalidone (HYGROTON) 25 MG tablet    Sig: Take 1 tablet (25 mg total) by mouth daily.    Dispense:  90 tablet    Refill:  3  . atorvastatin (LIPITOR) 40 MG tablet    Sig: Take 1 tablet (40 mg total) by mouth at bedtime.    Dispense:  90 tablet    Refill:  1  . blood glucose meter kit and supplies KIT    Sig: Dispense based on patient and insurance preference. Use up to four times daily as directed. (FOR ICD-9 250.00, 250.01).    Dispense:  1 each    Refill:  0    Order Specific Question:   Number of strips    Answer:   100    Order Specific Question:   Number of lancets    Answer:   100    Problem List Items Addressed This Visit      Unprioritized   DM (diabetes mellitus) type II uncontrolled, periph vascular disorder ([place])    hgba1c to be checked, minimize simple carbs. Increase exercise as tolerated. Continue current meds       Relevant Medications   Saxagliptin-Metformin 2.05-998 MG TB24   nitroGLYCERIN (NITROSTAT) 0.4 MG SL tablet   metoprolol tartrate (LOPRESSOR) 25 MG tablet   losartan (COZAAR) 50 MG tablet   Icosapent Ethyl (VASCEPA) 1 g CAPS   fenofibrate micronized (ANTARA) 130 MG capsule   chlorthalidone (HYGROTON) 25 MG tablet   atorvastatin (LIPITOR) 40 MG tablet   blood glucose meter kit and supplies KIT   Other Relevant Orders   Hemoglobin A1c  (Completed)   Essential hypertension - Primary    Well controlled, no changes to meds. Encouraged heart healthy diet such as the DASH diet and exercise as tolerated.        Relevant Medications   nitroGLYCERIN (NITROSTAT) 0.4 MG SL tablet   metoprolol tartrate (LOPRESSOR) 25 MG tablet   losartan (COZAAR) 50 MG tablet   Icosapent Ethyl (VASCEPA) 1 g CAPS   fenofibrate micronized (ANTARA) 130 MG capsule   chlorthalidone (HYGROTON) 25 MG tablet   atorvastatin (LIPITOR) 40 MG tablet   Other Relevant Orders   Hemoglobin A1c (Completed)   Comprehensive metabolic panel (Completed)   Lipid panel (Completed)   Hyperlipidemia associated with type 2 diabetes mellitus (HCC)    Encouraged heart healthy diet, increase exercise, avoid trans fats, consider a krill oil cap daily      Relevant Medications   Saxagliptin-Metformin 2.05-998 MG TB24   losartan (COZAAR) 50 MG tablet   Icosapent Ethyl (VASCEPA) 1 g CAPS   fenofibrate micronized (ANTARA) 130 MG capsule   atorvastatin (LIPITOR) 40 MG tablet   Hyperlipidemia LDL goal <70   Relevant Medications   nitroGLYCERIN (NITROSTAT) 0.4 MG SL tablet   metoprolol tartrate (LOPRESSOR) 25 MG tablet   losartan (COZAAR) 50 MG tablet   Icosapent Ethyl (VASCEPA) 1 g CAPS   fenofibrate micronized (ANTARA) 130 MG capsule   chlorthalidone (HYGROTON) 25 MG tablet   atorvastatin (LIPITOR) 40 MG tablet   Other Relevant Orders   Hemoglobin A1c (Completed)   Comprehensive metabolic panel (Completed)   Lipid panel (Completed)    Other Visit Diagnoses    Coronary artery disease involving coronary bypass graft of native heart with unstable angina pectoris (HCC)       Relevant Medications   nitroGLYCERIN (NITROSTAT) 0.4 MG SL tablet   metoprolol tartrate (LOPRESSOR) 25 MG tablet   losartan (COZAAR)  50 MG tablet   Icosapent Ethyl (VASCEPA) 1 g CAPS   fenofibrate micronized (ANTARA) 130 MG capsule   chlorthalidone (HYGROTON) 25 MG tablet   atorvastatin  (LIPITOR) 40 MG tablet   Primary osteoarthritis involving multiple joints       Relevant Medications   meloxicam (MOBIC) 15 MG tablet      Follow-up: Return in about 6 months (around 04/15/2018) for annual exam, fasting.  [name], DO

## 2017-10-15 NOTE — Patient Instructions (Signed)
Zona Shingles B?nh zona, cn ???c g?i l gi??i leo (herpes zoster), l m?t b?nh nhi?m trng gy pht ban ?au ???n trn da v m?n n??c ch?a ??y d?ch. B?nh zona khng lin quan ??n herpes sinh d?c, m?t b?nh nhi?m trng ly truy?n qua ???ng tnh d?c. B?nh zona ch? pht tri?n ? nh?ng ng??i:  ?a? bi? thu?y ??u.  ? ???c tim v??c xin th?y ??u. (?i?u ny hi?m khi x?y ra).  Nguyn nhn g gy ra? B?nh zona do vi ru?t varicella-zoster (VZV) gy ra. ?y l cu?ng la? lo?i vi rt gy b?nh th?y ??u. Sau khi b? nhi?m VZV, vi ru?t na?y ?? la?i trong c? th? trong tra?ng tha?i khng ho?t ??ng (ng?). B?nh zona s? pht tri?n n?u vi ru?t hoa?t ??ng tr?? la?i. ?i?u ny c th? x?y ra nhi?u n?m sau khi ti?p xc v?i VZV l?n ??u. Ch?a ro? nguyn nhn la?m cho vi ru?t hoa?t ??ng tr?? la?i. ?i?u g lm t?ng nguy c?? Nh?ng ng??i ? b? b?nh th?y ??u ho?c ?a? ???c tim pho?ng th?y ??u c nguy c? b? b?nh zona. B?nh ph? bi?n h?n ? nh?ng ng??i:  Trn 50 tu?i.  C h? th?ng phng v? (mi?n d?ch) y?u, ch?ng h?n nh? nh?ng ng??i b? HIV, AIDS ho?c ung th?.  ?ang dng cc lo?i thu?c lm suy y?u h? mi?n d?ch, ch?ng h?n nh? thu?c c?y ghp.  Bi? c?ng th??ng r?t nhi?u.  Cc d?u hi?u ho?c tri?u ch?ng l g? Cc tri?u ch?ng s?m c?a b?nh ny bao g?m ng?a, ?au bu?t v ?au ? m?t khu v?c trn da c?a quy? vi?. ?au c th? ???c m t? l rt b?ng, nh? bi? ?m ho?c ?au nhi. M?t vi ngy ho?c vi tu?n sau khi cc tri?u ch?ng b?t ??u, pht ban ?? ?au ???n xu?t hi?n, th??ng ? m?t bn c?a c? th? ki?u gi?ng nh? da?i b?ng ho??c dy th??t l?ng. Pht ban cu?i cng chuy?n sang m?n n??c ch?a di?ch v? ra, ?ng v?y v kh trong kho?ng 2-3 tu?n. Vo b?t k? th?i gian na?o trong qu trnh nhi?m tru?ng, quy? vi? co? th? bi?:  S?t.  ?n l?nh.  ?au ??u.  C?m gic kh ch?u ? b?ng.  Ch?n ?on tnh tr?ng ny nh? th? no? B?nh ny c th? ???c ch?n ?on b?ng cch khm da. ?i khi, m?u da ho?c m?u di?ch co? th? ????c l?y t? cc m?n n??c tr??c  khi ch?n ?on. Nh?ng m?u na?y s? ???c ki?m tra d??i knh hi?n vi ho?c g?i ??n m?t phng xt nghi?m ?? xe?t nghi?m. Tnh tr?ng ny ???c ?i?u tr? nh? th? no? Khng c cch ch?a c? th? cho b?nh ny. Chuyn gia ch?m [place] s?c kh?e c?a quy? vi? c th? s? k ??n thu?c ?? gip quy? vi? gia?m ?au, ph?c h?i nhanh chng h?n v trnh cc v?n ?? lu di. Thu?c c th? bao g?m:  Thu?c kha?ng vi ru?t.  Thu?c ch?ng vim.  Thu?c gi?m ?au.  N?u vng ? n?m trn m?t, qu v? c th? ???c gi?i thi?u ??n m?t bc s? chuyn khoa, ch?ng h?n nh? bc s? chuyn khoa m?t (bc s? nhn khoa) ho?c bc s? tai m?i h?ng (ENT) nh?m gip qu v? trnh b? cc v?n ?? v? m?t, ?au m?n tnh ho?c tn t?t. Tun th? nh?ng h??ng d?n ny ? nh: Thu?c  Ch? s? d?ng thu?c theo ch? d?n c?a chuyn gia ch?m [place] s?c kh?e.  Bi kem ch?ng ng?a ho?c kem thu?c gy t vo vng b? ?  nh h??ng theo h??ng d?n c?a chuyn gia ch?m [place] s?c kh?e c?a quy? vi?. Ch?m so?c mu?n n??c va? pha?t ban  Hy t?m n???c mt ho?c ch???m la?nh nh?ng ch? pha?t ban ho?c m?n n??c theo ch? d?n c?a chuyn gia ch?m [place] s?c kh?e c?a quy? vi?. ?i?u ny c th? gip gi?m ?au v gi?m ng?a.  Che ch? pha?t ban la?i b??ng m?t b?ng lo?ng (b?ng g?c). M??c qu?n a?o r?ng ?? giu?p gia?m ?au do va?i qu?n a?o co? va?o ch? pha?t ban.  Gi?? cho ch? pht ban v m?n n??c c?a quy? vi? s?ch s? b?ng x pho?ng nh? v n??c mt ho?c theo ch? d?n c?a chuyn gia ch?m [place] s?c kh?e c?a quy? vi?.  Ki?m tra ch? pha?t ban m?i ngy xem c d?u hi?u nhi?m trng hay khng. Nh??ng d?u hi?u na?y bao g?m ??, s?ng, ?au ko di ho?c t?ng ln.  Khng ch?c th?ng cc m?n n??c.  Khng gi vo ch? pht ban. H??ng d?n chung  Nghi? ng?i theo ch? d?n c?a chuyn gia ch?m [place] s?c kh?e.  Tun th? t?t c? cc l?n khm theo di theo ch? d?n c?a chuyn gia ch?m [place] s?c kh?e. ?i?u ny c vai tr quan tr?ng.  Cho ??n khi m?n n??c cu?a quy? vi? ?o?ng va?y, b?nh nhi?m trng cu?a quy? vi? c th? gy b?nh th?y ??u ?  nh?ng ng??i ch?a bao gi? bi? ho?c ?a? ????c tim v??c xin pho?ng b?nh ny. ?? ng?n ch?n ?i?u ny x?y ra, trnh ti?p xc v?i nh?ng ng??i khc, ??c bi?t l: ? Tre? em. ? Ph? n? c thai. ? Tr? em b? b?nh chm (eczema). ? Ng??i cao tu?i c c? [place] c?y ghp. ? Nh?ng ng??i c b?nh ma?n tnh, nh? b?nh b?ch c?u ho?c AIDS. Hy lin l?c v?i chuyn gia ch?m [place] s?c kh?e n?u:  Qu v? khng gia?m ?au sau khi dng thu?c.  C?n ?au cu?a quy? vi? khng ??? h?n sau khi lnh pht ban.  Ch? pht ban c?a quy? vi? trng nh? bi? nhi?m tru?ng. Nh??ng d?u hi?u nhi?m tru?ng bao g?m ??, s?ng, ?au ko di ho?c t?ng ln. Yu c?u tr? gip ngay l?p t?c n?u:  Pht ban ? trn m?t ho?c trn m?i cu?a quy? vi?.  Quy? vi? bi? ?au ?? m?t, ?au xung quanh vng m?t ho?c m?t c?m gic ? m?t bn m?t.  Quy? vi? bi? ?au tai ho?c quy? vi? bi? u? tai.  Quy? vi? m?t vi? gia?c.  Tnh tr?ng c?a qu v? tr?m tr?ng h?n. Thng tin ny khng nh?m m?c ?ch thay th? cho l?i khuyn m chuyn gia ch?m [place] s?c kh?e ni v?i qu v?. Hy b?o ??m qu v? ph?i th?o lu?n b?t k? v?n ?? g m qu v? c v?i chuyn gia ch?m [place] s?c kh?e c?a qu v?. Document Released: 04/26/2015 Document Revised: 04/20/2016 Document Reviewed: 11/13/2013 Else[employer] Interactive Patient Education  2018 [employer].

## 2017-10-15 NOTE — Assessment & Plan Note (Signed)
[id number] to be checked, minimize simple carbs. Increase exercise as tolerated. Continue current meds  

## 2017-10-17 ENCOUNTER — Other Ambulatory Visit: Payer: Self-pay | Admitting: *Deleted

## 2017-10-17 DIAGNOSIS — E1151 Type 2 diabetes mellitus with diabetic peripheral angiopathy without gangrene: Secondary | ICD-10-CM

## 2017-10-17 DIAGNOSIS — E1165 Type 2 diabetes mellitus with hyperglycemia: Principal | ICD-10-CM

## 2017-10-17 DIAGNOSIS — R69 Illness, unspecified: Secondary | ICD-10-CM | POA: Diagnosis not present

## 2017-10-17 DIAGNOSIS — IMO0002 Reserved for concepts with insufficient information to code with codable children: Secondary | ICD-10-CM

## 2017-10-17 MED ORDER — GLUCOSE BLOOD VI STRP: ORAL_STRIP | 1 refills | Status: DC

## 2017-10-17 MED ORDER — BLOOD GLUCOSE MONITOR KIT: PACK | 0 refills | Status: DC

## 2017-10-17 MED ORDER — LANCETS MISC: 1 refills | Status: DC

## 2018-01-18 DIAGNOSIS — R69 Illness, unspecified: Secondary | ICD-10-CM | POA: Diagnosis not present

## 2018-03-15 ENCOUNTER — Ambulatory Visit (INDEPENDENT_AMBULATORY_CARE_PROVIDER_SITE_OTHER): Payer: Medicare HMO | Admitting: Family Medicine

## 2018-03-15 ENCOUNTER — Encounter: Payer: Self-pay | Admitting: Family Medicine

## 2018-03-15 VITALS — BP 142/94 | HR 68 | Temp 97.8°F | Resp 14 | Ht 60.0 in | Wt 134.0 lb

## 2018-03-15 DIAGNOSIS — J069 Acute upper respiratory infection, unspecified: Secondary | ICD-10-CM

## 2018-03-15 MED ORDER — PROMETHAZINE-DM 6.25-15 MG/5ML PO SYRP: 5.0000 mL | ORAL_SOLUTION | Freq: Four times a day (QID) | ORAL | 0 refills | Status: DC | PRN

## 2018-03-15 MED ORDER — LORATADINE 10 MG PO TABS: 10.0000 mg | ORAL_TABLET | Freq: Every day | ORAL | 11 refills | Status: DC

## 2018-03-15 MED ORDER — HYDROCOD POLST-CPM POLST ER 10-8 MG/5ML PO SUER: 5.0000 mL | Freq: Every evening | ORAL | 0 refills | Status: DC | PRN

## 2018-03-15 NOTE — Patient Instructions (Signed)
Cough, Adult  Coughing is a reflex that clears your throat and your airways. Coughing helps to heal and protect your lungs. It is normal to cough occasionally, but a cough that happens with other symptoms or lasts a long time may be a sign of a condition that needs treatment. A cough may last only 2-3 weeks (acute), or it may last longer than 8 weeks (chronic). What are the causes? Coughing is commonly caused by:  Breathing in substances that irritate your lungs.  A viral or bacterial respiratory infection.  Allergies.  Asthma.  Postnasal drip.  Smoking.  Acid backing up from the stomach into the esophagus (gastroesophageal reflux).  Certain medicines.  Chronic lung problems, including COPD (or rarely, lung cancer).  Other medical conditions such as heart failure. Follow these instructions at home: Pay attention to any changes in your symptoms. Take these actions to help with your discomfort:  Take medicines only as told by your health care provider. ? If you were prescribed an antibiotic medicine, take it as told by your health care provider. Do not stop taking the antibiotic even if you start to feel better. ? Talk with your health care provider before you take a cough suppressant medicine.  Drink enough fluid to keep your urine clear or pale yellow.  If the air is dry, use a cold steam vaporizer or humidifier in your bedroom or your home to help loosen secretions.  Avoid anything that causes you to cough at work or at home.  If your cough is worse at night, try sleeping in a semi-upright position.  Avoid cigarette smoke. If you smoke, quit smoking. If you need help quitting, ask your health care provider.  Avoid caffeine.  Avoid alcohol.  Rest as needed. Contact a health care provider if:  You have new symptoms.  You cough up pus.  Your cough does not get better after 2-3 weeks, or your cough gets worse.  You cannot control your cough with suppressant  medicines and you are losing sleep.  You develop pain that is getting worse or pain that is not controlled with pain medicines.  You have a fever.  You have unexplained weight loss.  You have night sweats. Get help right away if:  You cough up blood.  You have difficulty breathing.  Your heartbeat is very fast. This information is not intended to replace advice given to you by your health care provider. Make sure you discuss any questions you have with your health care provider. Document Released: 07/01/2010 Document Revised: 06/10/2015 Document Reviewed: 03/11/2014 Elsevier Interactive Patient Education  2019 Elsevier Inc.  

## 2018-03-15 NOTE — Progress Notes (Signed)
Patient ID: Dominique Hayes, female    DOB: 11/21/1950  Age: 68 y.o. MRN: [id number]    Subjective:  Subjective  HPI [name] presents for prod cough since January. Translator present and husband  No nasal congestion, no fever.  She tried advil and left over amoxicillin  She also needs her immigration paperwork filled out.  She gets very anxious when being tested and due to age and lack of eduction she  panics and is unable to answer questions    Review of Systems  Constitutional: Negative for chills and fever.  HENT: Positive for congestion, postnasal drip, rhinorrhea, sneezing and sore throat. Negative for sinus pressure.   Respiratory: Negative for cough, chest tightness, shortness of breath and wheezing.   Cardiovascular: Negative for chest pain, palpitations and leg swelling.  Allergic/Immunologic: Negative for environmental allergies.  Neurological: Negative for weakness and headaches.  Psychiatric/Behavioral: Positive for decreased concentration. Negative for agitation, behavioral problems, confusion, dysphoric mood, self-injury, sleep disturbance and suicidal ideas. The patient is nervous/anxious.     History Past Medical History:  Diagnosis Date  . DM (diabetes mellitus) ([place])   . Hepatitis B   . Hyperlipidemia   . Hypertension   . Mild diastolic dysfunction     She has a past surgical history that includes Abdominal hysterectomy.   Her family history includes Hypertension in her father, mother, sister, and sister.She reports that she has never smoked. She has never used smokeless tobacco. She reports that she does not drink alcohol or use drugs.  Current Outpatient Medications on File Prior to Visit  Medication Sig Dispense Refill  . atorvastatin (LIPITOR) 40 MG tablet Take 1 tablet (40 mg total) by mouth at bedtime. 90 tablet 1  . blood glucose meter kit and supplies KIT Dispense based on patient and insurance preference. Use to test sugar once a day.  E11.51 1 each 0  .  chlorthalidone (HYGROTON) 25 MG tablet Take 1 tablet (25 mg total) by mouth daily. 90 tablet 3  . fenofibrate micronized (ANTARA) 130 MG capsule Take 1 capsule (130 mg total) by mouth daily before breakfast. 270 capsule 3  . glucose blood test strip Use to test sugar once a day.  E11.51 100 each 1  . Icosapent Ethyl (VASCEPA) 1 g CAPS Take 2 capsules (2 g total) by mouth 2 (two) times daily. 120 capsule 3  . Lancets MISC Use to test sugar once a day.  E11.51 100 each 1  . losartan (COZAAR) 50 MG tablet Take 1 tablet (50 mg total) by mouth daily. 90 tablet 3  . MEGARED OMEGA-3 KRILL OIL 500 MG CAPS Take 1 capsule by mouth daily. 30 capsule 2  . meloxicam (MOBIC) 15 MG tablet Take 1 tablet (15 mg total) by mouth daily as needed for pain. 90 tablet 0  . metoprolol tartrate (LOPRESSOR) 25 MG tablet Take 1 tablet (25 mg total) by mouth 2 (two) times daily. 180 tablet 1  . mometasone (ELOCON) 0.1 % cream APPLY TO AFFECTED AREA EVERY DAY 135 g 0  . nitroGLYCERIN (NITROSTAT) 0.4 MG SL tablet Place 1 tablet (0.4 mg total) under the tongue every 5 (five) minutes as needed for chest pain. 30 tablet 0  . nystatin cream (MYCOSTATIN) Apply 1 application topically 2 (two) times daily. 30 g 0  . Saxagliptin-Metformin 2.05-998 MG TB24 Take 2 tablets by mouth daily. 180 tablet 1  . valACYclovir (VALTREX) 1000 MG tablet Take 1 tablet (1,000 mg total) by mouth 3 (three)  times daily. 21 tablet 2  . [facility] ASPIRIN LOW DOSE 81 MG EC tablet TAKE 1 TABLET BY MOUTH DAILY. (Patient not taking: Reported on 03/15/2018) 30 tablet 3   No current facility-administered medications on file prior to visit.      Objective:  Objective  Physical Exam Vitals signs and nursing note reviewed.  Constitutional:      Appearance: She is well-developed.  HENT:     Right Ear: External ear normal.     Left Ear: External ear normal.  Eyes:     General:        Right eye: No discharge.        Left eye: No discharge.      Conjunctiva/sclera: Conjunctivae normal.  Cardiovascular:     Rate and Rhythm: Normal rate and regular rhythm.     Heart sounds: Normal heart sounds. No murmur.  Pulmonary:     Effort: Pulmonary effort is normal. No respiratory distress.     Breath sounds: Normal breath sounds. No wheezing or rales.  Chest:     Chest wall: No tenderness.  Lymphadenopathy:     Cervical: No cervical adenopathy.  Neurological:     Mental Status: She is alert and oriented to person, place, and time.  Psychiatric:        Attention and Perception: Attention normal.        Mood and Affect: Mood is anxious. Mood is not depressed or elated. Affect is not labile, blunt, flat, angry, tearful or inappropriate.        Speech: Speech normal.        Behavior: Behavior normal.        Thought Content: Thought content normal.        Judgment: Judgment normal.     Comments: Pt is able to answer most of my questions without the translator but answers in vietnamese     BP (!) 142/94 (BP Location: Left Arm, Patient Position: Sitting, Cuff Size: Normal)   Pulse 68   Temp 97.8 F (36.6 C)   Resp 14   Ht 5' (1.524 m)   Wt 134 lb (60.8 kg)   SpO2 99%   BMI 26.17 kg/m  Wt Readings from Last 3 Encounters:  03/15/18 134 lb (60.8 kg)  10/15/17 130 lb 6.4 oz (59.1 kg)  04/23/17 132 lb 12.8 oz (60.2 kg)     Lab Results  Component Value Date   WBC 7.3 04/03/2016   HGB 12.2 04/03/2016   HCT 35.9 (L) 04/03/2016   PLT 268.0 04/03/2016   GLUCOSE 87 10/15/2017   CHOL 198 10/15/2017   TRIG 209.0 (H) 10/15/2017   HDL 37.20 (L) 10/15/2017   LDLDIRECT 140.0 10/15/2017   LDLCALC 124 (H) 04/03/2016   ALT 15 10/15/2017   AST 20 10/15/2017   NA 139 10/15/2017   K 5.6 (H) 10/15/2017   CL 106 10/15/2017   CREATININE 1.03 10/15/2017   BUN 15 10/15/2017   CO2 26 10/15/2017   TSH 1.05 05/26/2013   HGBA1C 6.2 10/15/2017   MICROALBUR 1.8 03/12/2017    Mm Digital Screening Bilateral  Result Date: 04/03/2016 CLINICAL  DATA:  Screening. EXAM: DIGITAL SCREENING BILATERAL MAMMOGRAM WITH CAD COMPARISON:  None. ACR Breast Density Category b: There are scattered areas of fibroglandular density. FINDINGS: There are no findings suspicious for malignancy. Images were processed with CAD. IMPRESSION: No mammographic evidence of malignancy. A result letter of this screening mammogram will be mailed directly to the patient. RECOMMENDATION: Screening mammogram in  one year. (Code:SM-B-01Y) BI-RADS CATEGORY  1: Negative. Electronically Signed   By: Margarette Canada M.D.   On: 04/03/2016 14:18     Assessment & Plan:  Plan  I am having Rafaelita T. Rona Ravens start on loratadine, promethazine-dextromethorphan, and chlorpheniramine-HYDROcodone. I am also having her maintain her CVS ASPIRIN LOW DOSE, nystatin cream, valACYclovir, mometasone, Saxagliptin-Metformin, nitroGLYCERIN, metoprolol tartrate, MEGARED OMEGA-3 KRILL OIL, meloxicam, losartan, Icosapent Ethyl, fenofibrate micronized, chlorthalidone, atorvastatin, blood glucose meter kit and supplies, glucose blood, and Lancets.  Meds ordered this encounter  Medications  . loratadine (CLARITIN) 10 MG tablet    Sig: Take 1 tablet (10 mg total) by mouth daily.    Dispense:  30 tablet    Refill:  11  . promethazine-dextromethorphan (PROMETHAZINE-DM) 6.25-15 MG/5ML syrup    Sig: Take 5 mLs by mouth 4 (four) times daily as needed.    Dispense:  118 mL    Refill:  0  . chlorpheniramine-HYDROcodone (TUSSIONEX PENNKINETIC ER) 10-8 MG/5ML SUER    Sig: Take 5 mLs by mouth at bedtime as needed for cough.    Dispense:  140 mL    Refill:  0    Problem List Items Addressed This Visit    None    Visit Diagnoses    Viral upper respiratory tract infection    -  Primary   Relevant Medications   loratadine (CLARITIN) 10 MG tablet   promethazine-dextromethorphan (PROMETHAZINE-DM) 6.25-15 MG/5ML syrup   chlorpheniramine-HYDROcodone (TUSSIONEX PENNKINETIC ER) 10-8 MG/5ML SUER    immigrations paper work  filled out with translator present Pt in office for 45 min with >50% time discussing her uri symptoms and filling out immigration paperwork with translator    Follow-up: Return if symptoms worsen or fail to improve.  [name], DO

## 2018-03-16 ENCOUNTER — Encounter: Payer: Self-pay | Admitting: Family Medicine

## 2018-03-20 ENCOUNTER — Telehealth: Payer: Self-pay | Admitting: *Deleted

## 2018-03-20 ENCOUNTER — Other Ambulatory Visit: Payer: Self-pay | Admitting: Family Medicine

## 2018-03-20 DIAGNOSIS — R059 Cough, unspecified: Secondary | ICD-10-CM

## 2018-03-20 DIAGNOSIS — R05 Cough: Secondary | ICD-10-CM

## 2018-03-20 MED ORDER — BENZONATATE 100 MG PO CAPS: 100.0000 mg | ORAL_CAPSULE | Freq: Two times a day (BID) | ORAL | 0 refills | Status: DC | PRN

## 2018-03-20 NOTE — Telephone Encounter (Signed)
[facility] sent [facility] stating that promethazine dm syrup is on backorder and can we send in an alternative?

## 2018-03-20 NOTE — Telephone Encounter (Signed)
Tessalon sent in.

## 2018-03-21 ENCOUNTER — Other Ambulatory Visit: Payer: Self-pay | Admitting: Family Medicine

## 2018-03-21 DIAGNOSIS — R059 Cough, unspecified: Secondary | ICD-10-CM

## 2018-03-21 DIAGNOSIS — R05 Cough: Secondary | ICD-10-CM

## 2018-03-21 NOTE — Telephone Encounter (Signed)
They can try delsym otc or mucinex dm otc Or we can try a different pharmacy

## 2018-03-21 NOTE — Telephone Encounter (Signed)
It looks like this medication is on back order too along with the promethazine dm.  Any other alternatives?

## 2018-03-22 ENCOUNTER — Other Ambulatory Visit: Payer: Self-pay | Admitting: *Deleted

## 2018-03-22 DIAGNOSIS — R05 Cough: Secondary | ICD-10-CM

## 2018-03-22 DIAGNOSIS — R059 Cough, unspecified: Secondary | ICD-10-CM

## 2018-03-22 MED ORDER — BENZONATATE 200 MG PO CAPS: 200.0000 mg | ORAL_CAPSULE | Freq: Two times a day (BID) | ORAL | 0 refills | Status: DC | PRN

## 2018-03-22 NOTE — Telephone Encounter (Signed)
Pharmacy and patient notified.

## 2018-03-26 ENCOUNTER — Other Ambulatory Visit: Payer: Self-pay | Admitting: Family Medicine

## 2018-03-26 NOTE — Telephone Encounter (Signed)
Did pt get cough med?

## 2018-04-22 ENCOUNTER — Encounter: Payer: Self-pay | Admitting: Family Medicine

## 2018-04-25 ENCOUNTER — Telehealth: Payer: Self-pay | Admitting: Family Medicine

## 2018-04-25 DIAGNOSIS — E785 Hyperlipidemia, unspecified: Secondary | ICD-10-CM

## 2018-04-25 DIAGNOSIS — I10 Essential (primary) hypertension: Secondary | ICD-10-CM

## 2018-04-25 NOTE — Telephone Encounter (Signed)
Copied from CRM [id number]. Topic: Quick Communication - Rx Refill/Question >> Apr 25, 2018 11:45 AM Dalphine Handing A wrote: Medication:chlorthalidone (HYGROTON) 25 MG tablet,benzonatate (TESSALON) 200 MG capsule,chlorpheniramine-HYDROcodone (TUSSIONEX PENNKINETIC ER) 10-8 MG/5ML SURE,atorvastatin (LIPITOR) 40 MG tablet ,fenofibrate micronized (ANTARA) 130 MG capsule   Has the patient contacted their pharmacy? Yes (Agent: If no, request that the patient contact the pharmacy for the refill.) (Agent: If yes, when and what did the pharmacy advise?)Contact PCP  Preferred Pharmacy (with phone number or street name): [id number], [place] - [address] [phone] (Phone) [phone] (Fax)    Agent: Please be advised that RX refills may take up to 3 business days. We ask that you follow-up with your pharmacy.

## 2018-05-01 MED ORDER — CHLORTHALIDONE 25 MG PO TABS: 25.0000 mg | ORAL_TABLET | Freq: Every day | ORAL | 0 refills | Status: DC

## 2018-05-01 MED ORDER — ATORVASTATIN CALCIUM 40 MG PO TABS: 40.0000 mg | ORAL_TABLET | Freq: Every day | ORAL | 0 refills | Status: DC

## 2018-05-01 MED ORDER — FENOFIBRATE MICRONIZED 130 MG PO CAPS: 130.0000 mg | ORAL_CAPSULE | Freq: Every day | ORAL | 0 refills | Status: DC

## 2018-05-01 NOTE — Telephone Encounter (Signed)
No answer no vm.  Patient needs to schedule follow up virtual visit.

## 2018-07-29 ENCOUNTER — Other Ambulatory Visit: Payer: Self-pay | Admitting: Family Medicine

## 2018-07-29 DIAGNOSIS — I10 Essential (primary) hypertension: Secondary | ICD-10-CM

## 2018-09-22 DIAGNOSIS — R69 Illness, unspecified: Secondary | ICD-10-CM | POA: Diagnosis not present

## 2018-10-23 ENCOUNTER — Other Ambulatory Visit: Payer: Self-pay | Admitting: Family Medicine

## 2018-10-23 DIAGNOSIS — I10 Essential (primary) hypertension: Secondary | ICD-10-CM

## 2018-10-23 NOTE — Telephone Encounter (Signed)
Tried calling patient for appt.  She will need to follow up before any more refills.

## 2018-10-24 ENCOUNTER — Other Ambulatory Visit: Payer: Self-pay | Admitting: Family Medicine

## 2018-10-24 DIAGNOSIS — E785 Hyperlipidemia, unspecified: Secondary | ICD-10-CM

## 2018-11-15 ENCOUNTER — Ambulatory Visit (INDEPENDENT_AMBULATORY_CARE_PROVIDER_SITE_OTHER): Payer: Medicare HMO | Admitting: Family Medicine

## 2018-11-15 ENCOUNTER — Encounter: Payer: Self-pay | Admitting: Family Medicine

## 2018-11-15 ENCOUNTER — Other Ambulatory Visit: Payer: Self-pay

## 2018-11-15 ENCOUNTER — Telehealth: Payer: Self-pay | Admitting: *Deleted

## 2018-11-15 ENCOUNTER — Other Ambulatory Visit: Payer: Self-pay | Admitting: Family Medicine

## 2018-11-15 VITALS — BP 118/84 | HR 62 | Temp 97.2°F | Resp 18 | Ht 60.0 in | Wt 127.2 lb

## 2018-11-15 DIAGNOSIS — I25700 Atherosclerosis of coronary artery bypass graft(s), unspecified, with unstable angina pectoris: Secondary | ICD-10-CM | POA: Diagnosis not present

## 2018-11-15 DIAGNOSIS — E1165 Type 2 diabetes mellitus with hyperglycemia: Secondary | ICD-10-CM | POA: Diagnosis not present

## 2018-11-15 DIAGNOSIS — IMO0002 Reserved for concepts with insufficient information to code with codable children: Secondary | ICD-10-CM

## 2018-11-15 DIAGNOSIS — E1139 Type 2 diabetes mellitus with other diabetic ophthalmic complication: Secondary | ICD-10-CM

## 2018-11-15 DIAGNOSIS — G4700 Insomnia, unspecified: Secondary | ICD-10-CM

## 2018-11-15 DIAGNOSIS — E785 Hyperlipidemia, unspecified: Secondary | ICD-10-CM

## 2018-11-15 DIAGNOSIS — E1169 Type 2 diabetes mellitus with other specified complication: Secondary | ICD-10-CM | POA: Diagnosis not present

## 2018-11-15 DIAGNOSIS — I10 Essential (primary) hypertension: Secondary | ICD-10-CM

## 2018-11-15 DIAGNOSIS — N760 Acute vaginitis: Secondary | ICD-10-CM | POA: Diagnosis not present

## 2018-11-15 DIAGNOSIS — Z1211 Encounter for screening for malignant neoplasm of colon: Secondary | ICD-10-CM | POA: Diagnosis not present

## 2018-11-15 DIAGNOSIS — L309 Dermatitis, unspecified: Secondary | ICD-10-CM | POA: Diagnosis not present

## 2018-11-15 DIAGNOSIS — E1151 Type 2 diabetes mellitus with diabetic peripheral angiopathy without gangrene: Secondary | ICD-10-CM

## 2018-11-15 DIAGNOSIS — F419 Anxiety disorder, unspecified: Secondary | ICD-10-CM

## 2018-11-15 LAB — COMPREHENSIVE METABOLIC PANEL
ALT: 24 U/L (ref 0–35)
AST: 21 U/L (ref 0–37)
Albumin: 4.7 g/dL (ref 3.5–5.2)
Alkaline Phosphatase: 54 U/L (ref 39–117)
BUN: 20 mg/dL (ref 6–23)
CO2: 27 mEq/L (ref 19–32)
Calcium: 10.1 mg/dL (ref 8.4–10.5)
Chloride: 98 mEq/L (ref 96–112)
Creatinine, Ser: 1.16 mg/dL (ref 0.40–1.20)
GFR: 46.58 mL/min — ABNORMAL LOW (ref >60.00)
Glucose, Bld: 64 mg/dL — ABNORMAL LOW (ref 70–99)
Potassium: 4.2 mEq/L (ref 3.5–5.1)
Sodium: 139 mEq/L (ref 135–145)
Total Bilirubin: 0.9 mg/dL (ref 0.2–1.2)
Total Protein: 7.7 g/dL (ref 6.0–8.3)

## 2018-11-15 LAB — HEMOGLOBIN A1C: Hgb A1c MFr Bld: 6 % (ref 4.6–6.5)

## 2018-11-15 LAB — LIPID PANEL
Cholesterol: 199 mg/dL (ref 0–200)
HDL: 37.8 mg/dL — ABNORMAL LOW (ref >39.00)
NonHDL: 160.81
Total CHOL/HDL Ratio: 5
Triglycerides: 221 mg/dL — ABNORMAL HIGH (ref 0.0–149.0)
VLDL: 44.2 mg/dL — ABNORMAL HIGH (ref 0.0–40.0)

## 2018-11-15 LAB — LDL CHOLESTEROL, DIRECT: Direct LDL: 138 mg/dL

## 2018-11-15 MED ORDER — FENOFIBRATE MICRONIZED 130 MG PO CAPS: 130.0000 mg | ORAL_CAPSULE | Freq: Every day | ORAL | 0 refills | Status: DC

## 2018-11-15 MED ORDER — MOMETASONE FUROATE 0.1 % EX CREA: TOPICAL_CREAM | CUTANEOUS | 0 refills | Status: DC

## 2018-11-15 MED ORDER — NYSTATIN 100000 UNIT/GM EX CREA: 1.0000 "application " | TOPICAL_CREAM | Freq: Two times a day (BID) | CUTANEOUS | 0 refills | Status: DC

## 2018-11-15 MED ORDER — NITROGLYCERIN 0.4 MG SL SUBL: 0.4000 mg | SUBLINGUAL_TABLET | SUBLINGUAL | 0 refills | Status: DC | PRN

## 2018-11-15 MED ORDER — VASCEPA 1 G PO CAPS: 2.0000 | ORAL_CAPSULE | Freq: Two times a day (BID) | ORAL | 3 refills | Status: DC

## 2018-11-15 MED ORDER — OLMESARTAN MEDOXOMIL 20 MG PO TABS: 20.0000 mg | ORAL_TABLET | Freq: Every day | ORAL | 1 refills | Status: DC

## 2018-11-15 MED ORDER — CHLORTHALIDONE 25 MG PO TABS: 25.0000 mg | ORAL_TABLET | Freq: Every day | ORAL | 0 refills | Status: DC

## 2018-11-15 MED ORDER — TRAZODONE HCL 50 MG PO TABS: 25.0000 mg | ORAL_TABLET | Freq: Every evening | ORAL | 3 refills | Status: DC | PRN

## 2018-11-15 MED ORDER — SAXAGLIPTIN-METFORMIN ER 2.5-1000 MG PO TB24: 2.0000 | ORAL_TABLET | Freq: Every day | ORAL | 1 refills | Status: DC

## 2018-11-15 MED ORDER — METOPROLOL TARTRATE 25 MG PO TABS: 25.0000 mg | ORAL_TABLET | Freq: Two times a day (BID) | ORAL | 0 refills | Status: DC

## 2018-11-15 MED ORDER — ATORVASTATIN CALCIUM 40 MG PO TABS: 40.0000 mg | ORAL_TABLET | Freq: Every day | ORAL | 0 refills | Status: DC

## 2018-11-15 MED ORDER — JANUMET XR 50-1000 MG PO TB24: ORAL_TABLET | ORAL | 2 refills | Status: DC

## 2018-11-15 NOTE — Assessment & Plan Note (Signed)
Well controlled, no changes to meds. Encouraged heart healthy diet such as the DASH diet and exercise as tolerated.  °

## 2018-11-15 NOTE — Telephone Encounter (Signed)
[facility] sent [facility] that Kombiglyze XR is non formulary  Insurance will most likely cover  Janumet and Janumet XR Jantadueto and Jantadueto XR

## 2018-11-15 NOTE — Assessment & Plan Note (Signed)
Worsening with covid and trying to get her citizenship

## 2018-11-15 NOTE — Patient Instructions (Signed)
Carbohydrate Counting for Diabetes Mellitus, Adult  Carbohydrate counting is a method of keeping track of how many carbohydrates you eat. Eating carbohydrates naturally increases the amount of sugar (glucose) in the blood. Counting how many carbohydrates you eat helps keep your blood glucose within normal limits, which helps you manage your diabetes (diabetes mellitus). It is important to know how many carbohydrates you can safely have in each meal. This is different for every person. A diet and nutrition specialist (registered dietitian) can help you make a meal plan and calculate how many carbohydrates you should have at each meal and snack. Carbohydrates are found in the following foods:  Grains, such as breads and cereals.  Dried beans and soy products.  Starchy vegetables, such as potatoes, peas, and corn.  Fruit and fruit juices.  Milk and yogurt.  Sweets and snack foods, such as cake, cookies, candy, chips, and soft drinks. How do I count carbohydrates? There are two ways to count carbohydrates in food. You can use either of the methods or a combination of both. Reading "Nutrition Facts" on packaged food The "Nutrition Facts" list is included on the labels of almost all packaged foods and beverages in the [place] It includes:  The serving size.  Information about nutrients in each serving, including the grams (g) of carbohydrate per serving. To use the "Nutrition Facts":  Decide how many servings you will have.  Multiply the number of servings by the number of carbohydrates per serving.  The resulting number is the total amount of carbohydrates that you will be having. Learning standard serving sizes of other foods When you eat carbohydrate foods that are not packaged or do not include "Nutrition Facts" on the label, you need to measure the servings in order to count the amount of carbohydrates:  Measure the foods that you will eat with a food scale or measuring cup, if needed.   Decide how many standard-size servings you will eat.  Multiply the number of servings by 15. Most carbohydrate-rich foods have about 15 g of carbohydrates per serving. ? For example, if you eat 8 oz (170 g) of strawberries, you will have eaten 2 servings and 30 g of carbohydrates (2 servings x 15 g = 30 g).  For foods that have more than one food mixed, such as soups and casseroles, you must count the carbohydrates in each food that is included. The following list contains standard serving sizes of common carbohydrate-rich foods. Each of these servings has about 15 g of carbohydrates:   hamburger bun or  English muffin.   oz (15 mL) syrup.   oz (14 g) jelly.  1 slice of bread.  1 six-inch tortilla.  3 oz (85 g) cooked rice or pasta.  4 oz (113 g) cooked dried beans.  4 oz (113 g) starchy vegetable, such as peas, corn, or potatoes.  4 oz (113 g) hot cereal.  4 oz (113 g) mashed potatoes or  of a large baked potato.  4 oz (113 g) canned or frozen fruit.  4 oz (120 mL) fruit juice.  4-6 crackers.  6 chicken nuggets.  6 oz (170 g) unsweetened dry cereal.  6 oz (170 g) plain fat-free yogurt or yogurt sweetened with artificial sweeteners.  8 oz (240 mL) milk.  8 oz (170 g) fresh fruit or one small piece of fruit.  24 oz (680 g) popped popcorn. Example of carbohydrate counting Sample meal  3 oz (85 g) chicken breast.  6 oz (170 g)   brown rice.  4 oz (113 g) corn.  8 oz (240 mL) milk.  8 oz (170 g) strawberries with sugar-free whipped topping. Carbohydrate calculation 1. Identify the foods that contain carbohydrates: ? Rice. ? Corn. ? Milk. ? Strawberries. 2. Calculate how many servings you have of each food: ? 2 servings rice. ? 1 serving corn. ? 1 serving milk. ? 1 serving strawberries. 3. Multiply each number of servings by 15 g: ? 2 servings rice x 15 g = 30 g. ? 1 serving corn x 15 g = 15 g. ? 1 serving milk x 15 g = 15 g. ? 1 serving  strawberries x 15 g = 15 g. 4. Add together all of the amounts to find the total grams of carbohydrates eaten: ? 30 g + 15 g + 15 g + 15 g = 75 g of carbohydrates total. Summary  Carbohydrate counting is a method of keeping track of how many carbohydrates you eat.  Eating carbohydrates naturally increases the amount of sugar (glucose) in the blood.  Counting how many carbohydrates you eat helps keep your blood glucose within normal limits, which helps you manage your diabetes.  A diet and nutrition specialist (registered dietitian) can help you make a meal plan and calculate how many carbohydrates you should have at each meal and snack. This information is not intended to replace advice given to you by your health care provider. Make sure you discuss any questions you have with your health care provider. Document Released: 01/02/2005 Document Revised: 07/27/2016 Document Reviewed: 06/16/2015 Elsevier Patient Education  2020 Else[employer] Inc.  

## 2018-11-15 NOTE — Progress Notes (Signed)
Patient ID: Dominique Hayes, female    DOB: 1950/08/16  Age: 68 y.o. MRN: [id number]    Subjective:  Subjective  HPI Dominique Hayes presents for f/u dm, chol and bp.  Pt only complaint is she is having trouble sleeping  She has paperwork from dept homeland security for [place] citizenship and immigration services   HYPERTENSION   Blood pressure range-not checking   Chest pain- no      Dyspnea- no Lightheadedness- no   Edema- no  Other side effects - no   Medication compliance: good Low salt diet- yes    DIABETES    Blood Sugar ranges-not checking   Polyuria- no New Visual problems- no  Hypoglycemic symptoms- no  Other side effects-no Medication compliance - good Last eye exam- due Foot exam- today   HYPERLIPIDEMIA  Medication compliance- good RUQ pain- no  Muscle aches- no Other side effects-no     Review of Systems  Constitutional: Negative for appetite change, diaphoresis, fatigue and unexpected weight change.  Eyes: Negative for pain, redness and visual disturbance.  Respiratory: Negative for cough, chest tightness, shortness of breath and wheezing.   Cardiovascular: Negative for chest pain, palpitations and leg swelling.  Endocrine: Negative for cold intolerance, heat intolerance, polydipsia, polyphagia and polyuria.  Genitourinary: Negative for difficulty urinating, dysuria and frequency.  Neurological: Negative for dizziness, light-headedness, numbness and headaches.  Psychiatric/Behavioral: Positive for decreased concentration and sleep disturbance. The patient is nervous/anxious. The patient is not hyperactive.     History Past Medical History:  Diagnosis Date  . DM (diabetes mellitus) ([place])   . Hepatitis B   . Hyperlipidemia   . Hypertension   . Mild diastolic dysfunction     She has a past surgical history that includes Abdominal hysterectomy.   Her family history includes Hypertension in her father, mother, sister, and sister.She reports that she has never  smoked. She has never used smokeless tobacco. She reports that she does not drink alcohol or use drugs.  Current Outpatient Medications on File Prior to Visit  Medication Sig Dispense Refill  . blood glucose meter kit and supplies KIT Dispense based on patient and insurance preference. Use to test sugar once a day.  E11.51 1 each 0  . chlorpheniramine-HYDROcodone (TUSSIONEX PENNKINETIC ER) 10-8 MG/5ML SUER Take 5 mLs by mouth at bedtime as needed for cough. 140 mL 0  . CVS ASPIRIN LOW DOSE 81 MG EC tablet TAKE 1 TABLET BY MOUTH DAILY. 30 tablet 3  . glucose blood test strip Use to test sugar once a day.  E11.51 100 each 1  . Lancets MISC Use to test sugar once a day.  E11.51 100 each 1  . loratadine (CLARITIN) 10 MG tablet Take 1 tablet (10 mg total) by mouth daily. 30 tablet 11  . MEGARED OMEGA-3 KRILL OIL 500 MG CAPS Take 1 capsule by mouth daily. 30 capsule 2  . meloxicam (MOBIC) 15 MG tablet Take 1 tablet (15 mg total) by mouth daily as needed for pain. 90 tablet 0  . valACYclovir (VALTREX) 1000 MG tablet Take 1 tablet (1,000 mg total) by mouth 3 (three) times daily. 21 tablet 2  . benzonatate (TESSALON) 200 MG capsule Take 1 capsule (200 mg total) by mouth 2 (two) times daily as needed for cough. (Patient not taking: Reported on 11/15/2018) 20 capsule 0   No current facility-administered medications on file prior to visit.      Objective:  Objective  Physical Exam Vitals signs and  nursing note reviewed.  Constitutional:      Appearance: She is well-developed.  HENT:     Head: Normocephalic and atraumatic.  Eyes:     Conjunctiva/sclera: Conjunctivae normal.  Neck:     Musculoskeletal: Normal range of motion and neck supple.     Thyroid: No thyromegaly.     Vascular: No carotid bruit or JVD.  Cardiovascular:     Rate and Rhythm: Normal rate and regular rhythm.     Heart sounds: Normal heart sounds. No murmur.  Pulmonary:     Effort: Pulmonary effort is normal. No respiratory  distress.     Breath sounds: Normal breath sounds. No wheezing or rales.  Chest:     Chest wall: No tenderness.  Neurological:     Mental Status: She is alert and oriented to person, place, and time.    Diabetic Foot Exam - Simple   Simple Foot Form Diabetic Foot exam was performed with the following findings: Yes 11/15/2018 11:22 AM  Visual Inspection No deformities, no ulcerations, no other skin breakdown bilaterally: Yes Sensation Testing Intact to touch and monofilament testing bilaterally: Yes Pulse Check Posterior Tibialis and Dorsalis pulse intact bilaterally: Yes Comments     BP 118/84 (BP Location: Right Arm, Patient Position: Sitting, Cuff Size: Normal)   Pulse 62   Temp (!) 97.2 F (36.2 C) (Temporal)   Resp 18   Ht 5' (1.524 m)   Wt 127 lb 3.2 oz (57.7 kg)   SpO2 98%   BMI 24.84 kg/m  Wt Readings from Last 3 Encounters:  11/15/18 127 lb 3.2 oz (57.7 kg)  03/15/18 134 lb (60.8 kg)  10/15/17 130 lb 6.4 oz (59.1 kg)     Lab Results  Component Value Date   WBC 7.3 04/03/2016   HGB 12.2 04/03/2016   HCT 35.9 (L) 04/03/2016   PLT 268.0 04/03/2016   GLUCOSE 87 10/15/2017   CHOL 198 10/15/2017   TRIG 209.0 (H) 10/15/2017   HDL 37.20 (L) 10/15/2017   LDLDIRECT 140.0 10/15/2017   LDLCALC 124 (H) 04/03/2016   ALT 15 10/15/2017   AST 20 10/15/2017   NA 139 10/15/2017   K 5.6 (H) 10/15/2017   CL 106 10/15/2017   CREATININE 1.03 10/15/2017   BUN 15 10/15/2017   CO2 26 10/15/2017   TSH 1.05 05/26/2013   HGBA1C 6.2 10/15/2017   MICROALBUR 1.8 03/12/2017    Mm Digital Screening Bilateral  Result Date: 04/03/2016 CLINICAL DATA:  Screening. EXAM: DIGITAL SCREENING BILATERAL MAMMOGRAM WITH CAD COMPARISON:  None. ACR Breast Density Category b: There are scattered areas of fibroglandular density. FINDINGS: There are no findings suspicious for malignancy. Images were processed with CAD. IMPRESSION: No mammographic evidence of malignancy. A result letter of this  screening mammogram will be mailed directly to the patient. RECOMMENDATION: Screening mammogram in one year. (Code:SM-B-01Y) BI-RADS CATEGORY  1: Negative. Electronically Signed   By: Margarette Canada M.D.   On: 04/03/2016 14:18     Assessment & Plan:  Plan  I have changed Peniel T. Korol's olmesartan. I am also having her start on traZODone. Additionally, I am having her maintain her CVS Aspirin Low Dose, valACYclovir, MegaRed Omega-3 Krill Oil, meloxicam, blood glucose meter kit and supplies, glucose blood, Lancets, loratadine, chlorpheniramine-HYDROcodone, benzonatate, Saxagliptin-Metformin, nitroGLYCERIN, metoprolol tartrate, Vascepa, mometasone, nystatin cream, fenofibrate micronized, chlorthalidone, and atorvastatin.  Meds ordered this encounter  Medications  . traZODone (DESYREL) 50 MG tablet    Sig: Take 0.5-1 tablets (25-50 mg total) by  mouth at bedtime as needed for sleep.    Dispense:  30 tablet    Refill:  3  . Saxagliptin-Metformin 2.05-998 MG TB24    Sig: Take 2 tablets by mouth daily.    Dispense:  180 tablet    Refill:  1  . olmesartan (BENICAR) 20 MG tablet    Sig: Take 1 tablet (20 mg total) by mouth daily.    Dispense:  90 tablet    Refill:  1  . nitroGLYCERIN (NITROSTAT) 0.4 MG SL tablet    Sig: Place 1 tablet (0.4 mg total) under the tongue every 5 (five) minutes as needed for chest pain.    Dispense:  30 tablet    Refill:  0  . metoprolol tartrate (LOPRESSOR) 25 MG tablet    Sig: Take 1 tablet (25 mg total) by mouth 2 (two) times daily. Needs ov before any more refills    Dispense:  60 tablet    Refill:  0  . Icosapent Ethyl (VASCEPA) 1 g CAPS    Sig: Take 2 capsules (2 g total) by mouth 2 (two) times daily.    Dispense:  120 capsule    Refill:  3  . mometasone (ELOCON) 0.1 % cream    Sig: APPLY TO AFFECTED AREA EVERY DAY    Dispense:  135 g    Refill:  0  . nystatin cream (MYCOSTATIN)    Sig: Apply 1 application topically 2 (two) times daily.    Dispense:  30 g     Refill:  0  . fenofibrate micronized (ANTARA) 130 MG capsule    Sig: Take 1 capsule (130 mg total) by mouth daily before breakfast. Needs ov before any more refills    Dispense:  30 capsule    Refill:  0  . chlorthalidone (HYGROTON) 25 MG tablet    Sig: Take 1 tablet (25 mg total) by mouth daily. Please have patient to call for virtual visit for follow up    Dispense:  30 tablet    Refill:  0  . atorvastatin (LIPITOR) 40 MG tablet    Sig: Take 1 tablet (40 mg total) by mouth at bedtime. Please have patient to call for virtual visit for follow up    Dispense:  30 tablet    Refill:  0    Problem List Items Addressed This Visit      Unprioritized   Anxiety    Worsening with covid and trying to get her citizenship      Relevant Medications   traZODone (DESYREL) 50 MG tablet   DM (diabetes mellitus) type II uncontrolled, periph vascular disorder (HCC)   Relevant Medications   Saxagliptin-Metformin 2.05-998 MG TB24   olmesartan (BENICAR) 20 MG tablet   nitroGLYCERIN (NITROSTAT) 0.4 MG SL tablet   metoprolol tartrate (LOPRESSOR) 25 MG tablet   Icosapent Ethyl (VASCEPA) 1 g CAPS   fenofibrate micronized (ANTARA) 130 MG capsule   chlorthalidone (HYGROTON) 25 MG tablet   atorvastatin (LIPITOR) 40 MG tablet   DM (diabetes mellitus) type II uncontrolled with eye manifestation (HCC)    hgba1c to be checked, minimize simple carbs. Increase exercise as tolerated. Continue current meds      Relevant Medications   Saxagliptin-Metformin 2.05-998 MG TB24   olmesartan (BENICAR) 20 MG tablet   atorvastatin (LIPITOR) 40 MG tablet   Essential hypertension    Well controlled, no changes to meds. Encouraged heart healthy diet such as the DASH diet and exercise as tolerated.  Relevant Medications   olmesartan (BENICAR) 20 MG tablet   nitroGLYCERIN (NITROSTAT) 0.4 MG SL tablet   metoprolol tartrate (LOPRESSOR) 25 MG tablet   Icosapent Ethyl (VASCEPA) 1 g CAPS   fenofibrate micronized  (ANTARA) 130 MG capsule   chlorthalidone (HYGROTON) 25 MG tablet   atorvastatin (LIPITOR) 40 MG tablet   Other Relevant Orders   Lipid panel   Comprehensive metabolic panel   Hyperlipidemia associated with type 2 diabetes mellitus (HCC)   Relevant Medications   Saxagliptin-Metformin 2.05-998 MG TB24   olmesartan (BENICAR) 20 MG tablet   Icosapent Ethyl (VASCEPA) 1 g CAPS   fenofibrate micronized (ANTARA) 130 MG capsule   atorvastatin (LIPITOR) 40 MG tablet   Other Relevant Orders   Lipid panel   Comprehensive metabolic panel   Hyperlipidemia LDL goal <70    Tolerating statin, encouraged heart healthy diet, avoid trans fats, minimize simple carbs and saturated fats. Increase exercise as tolerated      Relevant Medications   olmesartan (BENICAR) 20 MG tablet   nitroGLYCERIN (NITROSTAT) 0.4 MG SL tablet   metoprolol tartrate (LOPRESSOR) 25 MG tablet   Icosapent Ethyl (VASCEPA) 1 g CAPS   fenofibrate micronized (ANTARA) 130 MG capsule   chlorthalidone (HYGROTON) 25 MG tablet   atorvastatin (LIPITOR) 40 MG tablet    Other Visit Diagnoses    Uncontrolled type 2 diabetes mellitus with hyperglycemia (HCC)    -  Primary   Relevant Medications   Saxagliptin-Metformin 2.05-998 MG TB24   olmesartan (BENICAR) 20 MG tablet   atorvastatin (LIPITOR) 40 MG tablet   Other Relevant Orders   Hemoglobin A1c   Comprehensive metabolic panel   Insomnia, unspecified type       Relevant Medications   traZODone (DESYREL) 50 MG tablet   Coronary artery disease involving coronary bypass graft of native heart with unstable angina pectoris (HCC)       Relevant Medications   olmesartan (BENICAR) 20 MG tablet   nitroGLYCERIN (NITROSTAT) 0.4 MG SL tablet   metoprolol tartrate (LOPRESSOR) 25 MG tablet   Icosapent Ethyl (VASCEPA) 1 g CAPS   fenofibrate micronized (ANTARA) 130 MG capsule   chlorthalidone (HYGROTON) 25 MG tablet   atorvastatin (LIPITOR) 40 MG tablet   Dermatitis       Relevant  Medications   mometasone (ELOCON) 0.1 % cream   Vaginitis and vulvovaginitis       Relevant Medications   nystatin cream (MYCOSTATIN)   Colon cancer screening       Relevant Orders   Ambulatory referral to Gastroenterology      Follow-up: Return in about 6 months (around 05/16/2019) for annual exam, fasting.  [name], DO

## 2018-11-15 NOTE — Assessment & Plan Note (Signed)
Tolerating statin, encouraged heart healthy diet, avoid trans fats, minimize simple carbs and saturated fats. Increase exercise as tolerated 

## 2018-11-15 NOTE — Assessment & Plan Note (Signed)
[id number] to be checked, minimize simple carbs. Increase exercise as tolerated. Continue current meds  

## 2018-11-15 NOTE — Telephone Encounter (Signed)
Change to janumet xr

## 2018-11-17 ENCOUNTER — Other Ambulatory Visit: Payer: Self-pay | Admitting: Family Medicine

## 2018-11-17 DIAGNOSIS — I10 Essential (primary) hypertension: Secondary | ICD-10-CM

## 2018-11-17 DIAGNOSIS — E1169 Type 2 diabetes mellitus with other specified complication: Secondary | ICD-10-CM

## 2018-11-17 DIAGNOSIS — E1139 Type 2 diabetes mellitus with other diabetic ophthalmic complication: Secondary | ICD-10-CM

## 2018-11-18 NOTE — Telephone Encounter (Signed)
rx sent in 

## 2018-11-19 ENCOUNTER — Telehealth: Payer: Self-pay

## 2018-11-19 NOTE — Telephone Encounter (Signed)
Copied from [place] [id number] [id number]. Topic: General - Inquiry >> Nov 15, 2018  3:47 PM Alease Frame wrote: Reason for CRM: Patient returning call from office . Didn't leave a message

## 2018-11-20 ENCOUNTER — Other Ambulatory Visit: Payer: Self-pay

## 2018-11-20 ENCOUNTER — Other Ambulatory Visit: Payer: Self-pay | Admitting: Family Medicine

## 2018-11-20 DIAGNOSIS — I10 Essential (primary) hypertension: Secondary | ICD-10-CM

## 2018-11-20 MED ORDER — ATORVASTATIN CALCIUM 80 MG PO TABS: 80.0000 mg | ORAL_TABLET | Freq: Every day | ORAL | 2 refills | Status: DC

## 2018-11-20 NOTE — Telephone Encounter (Signed)
Spoke with pt's husband and gave results. New Rx sent in

## 2018-12-10 ENCOUNTER — Other Ambulatory Visit: Payer: Self-pay | Admitting: Family Medicine

## 2018-12-10 DIAGNOSIS — E785 Hyperlipidemia, unspecified: Secondary | ICD-10-CM

## 2018-12-10 DIAGNOSIS — I10 Essential (primary) hypertension: Secondary | ICD-10-CM

## 2018-12-10 DIAGNOSIS — G4700 Insomnia, unspecified: Secondary | ICD-10-CM

## 2019-02-04 ENCOUNTER — Other Ambulatory Visit: Payer: Self-pay | Admitting: Family Medicine

## 2019-02-04 DIAGNOSIS — E1165 Type 2 diabetes mellitus with hyperglycemia: Secondary | ICD-10-CM

## 2019-02-15 ENCOUNTER — Other Ambulatory Visit: Payer: Self-pay | Admitting: Family Medicine

## 2019-03-09 ENCOUNTER — Other Ambulatory Visit: Payer: Self-pay | Admitting: Family Medicine

## 2019-03-09 DIAGNOSIS — E785 Hyperlipidemia, unspecified: Secondary | ICD-10-CM

## 2019-03-09 DIAGNOSIS — I10 Essential (primary) hypertension: Secondary | ICD-10-CM

## 2019-03-09 DIAGNOSIS — J069 Acute upper respiratory infection, unspecified: Secondary | ICD-10-CM

## 2019-04-02 ENCOUNTER — Other Ambulatory Visit: Payer: Self-pay | Admitting: Family Medicine

## 2019-04-02 DIAGNOSIS — E785 Hyperlipidemia, unspecified: Secondary | ICD-10-CM

## 2019-04-10 ENCOUNTER — Ambulatory Visit: Payer: Medicare HMO | Attending: Internal Medicine

## 2019-04-10 DIAGNOSIS — Z23 Encounter for immunization: Secondary | ICD-10-CM

## 2019-04-10 NOTE — Progress Notes (Signed)
   Covid-19 Vaccination Clinic  Name:  Dominique Hayes    MRN: [id number] DOB: [birth year]  04/10/2019  Dominique Hayes was observed post Covid-19 immunization for 15 minutes without incident. She was provided with Vaccine Information Sheet and instruction to access the V-Safe system.   Dominique Hayes was instructed to call 911 with any severe reactions post vaccine: Marland Kitchen Difficulty breathing  . Swelling of face and throat  . A fast heartbeat  . A bad rash all over body  . Dizziness and weakness   Immunizations Administered    Name Date Dose VIS Date Route   Pfizer COVID-19 Vaccine 04/10/2019  8:38 AM 0.3 mL 12/27/2018 Intramuscular   Manufacturer: [employer], [employer]   Lot: [id number]   NDC: [address]-8

## 2019-04-14 ENCOUNTER — Ambulatory Visit: Payer: Self-pay

## 2019-04-29 ENCOUNTER — Other Ambulatory Visit: Payer: Self-pay | Admitting: Family Medicine

## 2019-04-29 DIAGNOSIS — E785 Hyperlipidemia, unspecified: Secondary | ICD-10-CM

## 2019-04-30 ENCOUNTER — Other Ambulatory Visit: Payer: Self-pay | Admitting: Family Medicine

## 2019-04-30 DIAGNOSIS — E1165 Type 2 diabetes mellitus with hyperglycemia: Secondary | ICD-10-CM

## 2019-05-05 ENCOUNTER — Ambulatory Visit: Payer: Medicare HMO | Attending: Internal Medicine

## 2019-05-05 DIAGNOSIS — Z23 Encounter for immunization: Secondary | ICD-10-CM

## 2019-05-05 NOTE — Progress Notes (Signed)
   Covid-19 Vaccination Clinic  Name:  Dominique Hayes    MRN: [id number] DOB: 1950-12-07  05/05/2019  [name] was observed post Covid-19 immunization for 15 minutes without incident. She was provided with Vaccine Information Sheet and instruction to access the V-Safe system.   [name] was instructed to call 911 with any severe reactions post vaccine: Marland Kitchen Difficulty breathing  . Swelling of face and throat  . A fast heartbeat  . A bad rash all over body  . Dizziness and weakness   Immunizations Administered    Name Date Dose VIS Date Route   Pfizer COVID-19 Vaccine 05/05/2019  8:30 AM 0.3 mL 03/12/2018 Intramuscular   Manufacturer: [employer], [employer]   Lot: [id number]   NDC: [address]-2

## 2019-05-14 ENCOUNTER — Other Ambulatory Visit: Payer: Self-pay | Admitting: Family Medicine

## 2019-05-14 DIAGNOSIS — I10 Essential (primary) hypertension: Secondary | ICD-10-CM

## 2019-05-22 ENCOUNTER — Other Ambulatory Visit: Payer: Self-pay | Admitting: Family Medicine

## 2019-05-22 DIAGNOSIS — E785 Hyperlipidemia, unspecified: Secondary | ICD-10-CM

## 2019-05-22 NOTE — Telephone Encounter (Signed)
Vascepa refill sent. Notified pt's spouse that appointment is due. He states he and pt have [employer] now and wants to know if we accept that insurance. Advised pt per Front office recommendation that they will have to check with their specific plan to see if Dr Zola Button is covered. Provided pt's spouse with spelling of PCP and address to verify with insurance. Spouse voices understanding.

## 2019-05-23 ENCOUNTER — Other Ambulatory Visit: Payer: Self-pay | Admitting: Family Medicine

## 2019-05-23 DIAGNOSIS — L309 Dermatitis, unspecified: Secondary | ICD-10-CM

## 2019-06-15 ENCOUNTER — Other Ambulatory Visit: Payer: Self-pay | Admitting: Family Medicine

## 2019-06-15 DIAGNOSIS — G4700 Insomnia, unspecified: Secondary | ICD-10-CM

## 2019-06-20 ENCOUNTER — Other Ambulatory Visit: Payer: Self-pay | Admitting: Family Medicine

## 2019-06-20 DIAGNOSIS — E785 Hyperlipidemia, unspecified: Secondary | ICD-10-CM

## 2019-07-04 ENCOUNTER — Other Ambulatory Visit: Payer: Self-pay | Admitting: Family Medicine

## 2019-07-04 DIAGNOSIS — E785 Hyperlipidemia, unspecified: Secondary | ICD-10-CM

## 2019-07-08 ENCOUNTER — Other Ambulatory Visit: Payer: Self-pay | Admitting: Family Medicine

## 2019-07-08 ENCOUNTER — Telehealth: Payer: Self-pay | Admitting: Family Medicine

## 2019-07-08 DIAGNOSIS — E785 Hyperlipidemia, unspecified: Secondary | ICD-10-CM

## 2019-07-08 NOTE — Telephone Encounter (Signed)
Antara not covered- preferred alternatives:   0 Fenofibrate Micronized (ANTARA) 90 MG CAPS 0 fenofibrate (TRICOR) 145 MG tablet 0 fenofibrate 160 MG tablet 0 Choline Fenofibrate (FENOFIBRIC ACID) 135 MG CPDR 0 fenofibrate micronized (LOFIBRA) 200 MG capsule

## 2019-07-08 NOTE — Telephone Encounter (Signed)
Looks like insurance will not cover medication and they sent a list of alternatives underneath.

## 2019-07-08 NOTE — Telephone Encounter (Signed)
[facility] removed as PCP.

## 2019-07-08 NOTE — Telephone Encounter (Signed)
Pt goes to [facility] now

## 2019-07-09 ENCOUNTER — Other Ambulatory Visit: Payer: Self-pay | Admitting: Family Medicine

## 2019-07-09 DIAGNOSIS — E785 Hyperlipidemia, unspecified: Secondary | ICD-10-CM

## 2019-07-30 ENCOUNTER — Other Ambulatory Visit: Payer: Self-pay | Admitting: Family Medicine

## 2019-07-30 DIAGNOSIS — E1165 Type 2 diabetes mellitus with hyperglycemia: Secondary | ICD-10-CM

## 2019-08-07 ENCOUNTER — Other Ambulatory Visit: Payer: Self-pay | Admitting: Family Medicine

## 2019-08-07 DIAGNOSIS — I10 Essential (primary) hypertension: Secondary | ICD-10-CM

## 2019-09-04 ENCOUNTER — Other Ambulatory Visit: Payer: Self-pay | Admitting: Family Medicine

## 2019-09-04 DIAGNOSIS — I10 Essential (primary) hypertension: Secondary | ICD-10-CM

## 2019-09-17 ENCOUNTER — Other Ambulatory Visit: Payer: Self-pay | Admitting: Family Medicine

## 2019-09-17 DIAGNOSIS — E785 Hyperlipidemia, unspecified: Secondary | ICD-10-CM

## 2019-11-11 ENCOUNTER — Other Ambulatory Visit: Payer: Self-pay | Admitting: Family Medicine

## 2019-11-11 DIAGNOSIS — I10 Essential (primary) hypertension: Secondary | ICD-10-CM

## 2020-03-01 ENCOUNTER — Ambulatory Visit: Payer: Medicare HMO | Admitting: Medical

## 2020-03-05 ENCOUNTER — Other Ambulatory Visit: Payer: Self-pay

## 2020-03-08 ENCOUNTER — Encounter: Payer: Self-pay | Admitting: Family Medicine

## 2020-03-08 ENCOUNTER — Other Ambulatory Visit: Payer: Self-pay

## 2020-03-08 ENCOUNTER — Ambulatory Visit (INDEPENDENT_AMBULATORY_CARE_PROVIDER_SITE_OTHER): Payer: Medicare HMO | Admitting: Family Medicine

## 2020-03-08 VITALS — BP 142/78 | HR 68 | Temp 98.2°F | Resp 18 | Ht 60.0 in | Wt 127.4 lb

## 2020-03-08 DIAGNOSIS — E785 Hyperlipidemia, unspecified: Secondary | ICD-10-CM

## 2020-03-08 DIAGNOSIS — E1169 Type 2 diabetes mellitus with other specified complication: Secondary | ICD-10-CM

## 2020-03-08 DIAGNOSIS — E1165 Type 2 diabetes mellitus with hyperglycemia: Secondary | ICD-10-CM

## 2020-03-08 DIAGNOSIS — E1151 Type 2 diabetes mellitus with diabetic peripheral angiopathy without gangrene: Secondary | ICD-10-CM | POA: Diagnosis not present

## 2020-03-08 DIAGNOSIS — I10 Essential (primary) hypertension: Secondary | ICD-10-CM

## 2020-03-08 DIAGNOSIS — Z23 Encounter for immunization: Secondary | ICD-10-CM

## 2020-03-08 DIAGNOSIS — E559 Vitamin D deficiency, unspecified: Secondary | ICD-10-CM

## 2020-03-08 DIAGNOSIS — IMO0002 Reserved for concepts with insufficient information to code with codable children: Secondary | ICD-10-CM

## 2020-03-08 MED ORDER — VITAMIN D (ERGOCALCIFEROL) 1.25 MG (50000 UNIT) PO CAPS: 50000.0000 [IU] | ORAL_CAPSULE | ORAL | 3 refills | Status: DC

## 2020-03-08 NOTE — Assessment & Plan Note (Signed)
Well controlled, no changes to meds. Encouraged heart healthy diet such as the DASH diet and exercise as tolerated.  °

## 2020-03-08 NOTE — Assessment & Plan Note (Signed)
[id number] to be checked, minimize simple carbs. Increase exercise as tolerated. Continue current meds  

## 2020-03-08 NOTE — Progress Notes (Signed)
Patient ID: Dominique Hayes, female    DOB: [birth year]  Age: 70 y.o. MRN: [id number]    Subjective:  Subjective  HPI CARNESHIA RAKER presents to [facility] [place] and f/u dm, chol and bp.   No complaints   Her husband and translator are present   Review of Systems  Constitutional: Negative for activity change, appetite change, diaphoresis, fatigue and unexpected weight change.  Eyes: Negative for pain, redness and visual disturbance.  Respiratory: Negative for cough, chest tightness, shortness of breath and wheezing.   Cardiovascular: Negative for chest pain, palpitations and leg swelling.  Endocrine: Negative for cold intolerance, heat intolerance, polydipsia, polyphagia and polyuria.  Genitourinary: Negative for difficulty urinating, dysuria and frequency.  Neurological: Negative for dizziness, light-headedness, numbness and headaches.  Psychiatric/Behavioral: Negative for behavioral problems and dysphoric mood. The patient is not nervous/anxious.     History Past Medical History:  Diagnosis Date  . DM (diabetes mellitus) ([place])   . Hepatitis B   . Hyperlipidemia   . Hypertension   . Mild diastolic dysfunction     She has a past surgical history that includes Abdominal hysterectomy.   Her family history includes Hypertension in her father, mother, sister, and sister.She reports that she has never smoked. She has never used smokeless tobacco. She reports that she does not drink alcohol and does not use drugs.  Current Outpatient Medications on File Prior to Visit  Medication Sig Dispense Refill  . atorvastatin (LIPITOR) 80 MG tablet TAKE 1 TABLET BY MOUTH EVERY DAY 90 tablet 1  . benzonatate (TESSALON) 200 MG capsule Take 1 capsule (200 mg total) by mouth 2 (two) times daily as needed for cough. (Patient not taking: No sig reported) 20 capsule 0  . blood glucose meter kit and supplies KIT Dispense based on patient and insurance preference. Use to test sugar once a day.  E11.51 1 each 0  .  chlorpheniramine-HYDROcodone (TUSSIONEX PENNKINETIC ER) 10-8 MG/5ML SUER Take 5 mLs by mouth at bedtime as needed for cough. (Patient not taking: Reported on 03/08/2020) 140 mL 0  . chlorthalidone (HYGROTON) 25 MG tablet TAKE 1 TABLET (25 MG TOTAL) BY MOUTH DAILY. PLEASE HAVE PATIENT TO CALL FOR VIRTUAL VISIT FOR FOLLOW UP 90 tablet 1  . CVS ASPIRIN LOW DOSE 81 MG EC tablet TAKE 1 TABLET BY MOUTH DAILY. 30 tablet 3  . fenofibrate micronized (ANTARA) 130 MG capsule TAKE 1 CAPSULE (130 MG TOTAL) BY MOUTH DAILY BEFORE BREAKFAST. NEEDS OV BEFORE ANY MORE REFILLS 90 capsule 1  . glucose blood test strip Use to test sugar once a day.  E11.51 100 each 1  . Lancets MISC Use to test sugar once a day.  E11.51 100 each 1  . loratadine (CLARITIN) 10 MG tablet TAKE 1 TABLET BY MOUTH EVERY DAY 90 tablet 3  . MEGARED OMEGA-3 KRILL OIL 500 MG CAPS Take 1 capsule by mouth daily. 30 capsule 2  . meloxicam (MOBIC) 15 MG tablet Take 1 tablet (15 mg total) by mouth daily as needed for pain. 90 tablet 0  . metoprolol tartrate (LOPRESSOR) 25 MG tablet TAKE 1 TABLET BY MOUTH TWICE A DAY 180 tablet 0  . mometasone (ELOCON) 0.1 % cream APPLY TOPICALLY TO AFFECTED AREA EVERY DAY 135 g 0  . nitroGLYCERIN (NITROSTAT) 0.4 MG SL tablet Place 1 tablet (0.4 mg total) under the tongue every 5 (five) minutes as needed for chest pain. 30 tablet 0  . nystatin cream (MYCOSTATIN) Apply 1 application topically 2 (two) times  daily. 30 g 0  . olmesartan (BENICAR) 20 MG tablet TAKE 1 TABLET BY MOUTH EVERY DAY 90 tablet 1  . Saxagliptin-Metformin 2.05-998 MG TB24 Take 2 tablets by mouth daily. 180 tablet 1  . SitaGLIPtin-MetFORMIN HCl (JANUMET XR) 50-1000 MG TB24 TAKE 2 TABLETS BY MOUTH EVERY DAY. Pt needs OV for further refills 60 tablet 2  . traZODone (DESYREL) 50 MG tablet TAKE 0.5-1 TABLETS (25-50 MG TOTAL) BY MOUTH AT BEDTIME AS NEEDED FOR SLEEP. 90 tablet 1  . valACYclovir (VALTREX) 1000 MG tablet Take 1 tablet (1,000 mg total) by mouth  3 (three) times daily. 21 tablet 2  . VASCEPA 1 g capsule TAKE 2 CAPSULES (2 G TOTAL) BY MOUTH 2 (TWO) TIMES DAILY. 120 capsule 2   No current facility-administered medications on file prior to visit.     Objective:  Objective  Physical Exam Vitals and nursing note reviewed.  Constitutional:      Appearance: She is well-developed and well-nourished.  HENT:     Head: Normocephalic and atraumatic.  Eyes:     Extraocular Movements: EOM normal.     Conjunctiva/sclera: Conjunctivae normal.  Neck:     Thyroid: No thyromegaly.     Vascular: No carotid bruit or JVD.  Cardiovascular:     Rate and Rhythm: Normal rate and regular rhythm.     Heart sounds: Normal heart sounds. No murmur heard.   Pulmonary:     Effort: Pulmonary effort is normal. No respiratory distress.     Breath sounds: Normal breath sounds. No wheezing or rales.  Chest:     Chest wall: No tenderness.  Musculoskeletal:        General: No edema.     Cervical back: Normal range of motion and neck supple.  Neurological:     Mental Status: She is alert and oriented to person, place, and time.  Psychiatric:        Mood and Affect: Mood and affect normal.    BP (!) 142/78 (BP Location: Right Arm, Patient Position: Sitting, Cuff Size: Normal)   Pulse 68   Temp 98.2 F (36.8 C) (Oral)   Resp 18   Ht 5' (1.524 m)   Wt 127 lb 6.4 oz (57.8 kg)   SpO2 98%   BMI 24.88 kg/m  Wt Readings from Last 3 Encounters:  03/08/20 127 lb 6.4 oz (57.8 kg)  11/15/18 127 lb 3.2 oz (57.7 kg)  03/15/18 134 lb (60.8 kg)     Lab Results  Component Value Date   WBC 7.3 04/03/2016   HGB 12.2 04/03/2016   HCT 35.9 (L) 04/03/2016   PLT 268.0 04/03/2016   GLUCOSE 64 (L) 11/15/2018   CHOL 199 11/15/2018   TRIG 221.0 (H) 11/15/2018   HDL 37.80 (L) 11/15/2018   LDLDIRECT 138.0 11/15/2018   LDLCALC 124 (H) 04/03/2016   ALT 24 11/15/2018   AST 21 11/15/2018   NA 139 11/15/2018   K 4.2 11/15/2018   CL 98 11/15/2018   CREATININE  1.16 11/15/2018   BUN 20 11/15/2018   CO2 27 11/15/2018   TSH 1.05 05/26/2013   HGBA1C 6.0 11/15/2018   MICROALBUR 1.8 03/12/2017    MM DIGITAL SCREENING BILATERAL  Result Date: 04/03/2016 CLINICAL DATA:  Screening. EXAM: DIGITAL SCREENING BILATERAL MAMMOGRAM WITH CAD COMPARISON:  None. ACR Breast Density Category b: There are scattered areas of fibroglandular density. FINDINGS: There are no findings suspicious for malignancy. Images were processed with CAD. IMPRESSION: No mammographic evidence of malignancy. A  result letter of this screening mammogram will be mailed directly to the patient. RECOMMENDATION: Screening mammogram in one year. (Code:SM-B-01Y) BI-RADS CATEGORY  1: Negative. Electronically Signed   By: Margarette Canada M.D.   On: 04/03/2016 14:18     Assessment & Plan:  Plan  I am having Adryana T. Rona Ravens start on Vitamin D (Ergocalciferol). I am also having her maintain her CVS Aspirin Low Dose, valACYclovir, MegaRed Omega-3 Krill Oil, meloxicam, blood glucose meter kit and supplies, glucose blood, Lancets, chlorpheniramine-HYDROcodone, benzonatate, Saxagliptin-Metformin, nitroGLYCERIN, nystatin cream, traZODone, chlorthalidone, fenofibrate micronized, loratadine, mometasone, Janumet XR, olmesartan, atorvastatin, Vascepa, and metoprolol tartrate.  Meds ordered this encounter  Medications  . Vitamin D, Ergocalciferol, (DRISDOL) 1.25 MG (50000 UNIT) CAPS capsule    Sig: Take 1 capsule (50,000 Units total) by mouth every 7 (seven) days.    Dispense:  12 capsule    Refill:  3    Problem List Items Addressed This Visit      Unprioritized   DM (diabetes mellitus) type II uncontrolled, periph vascular disorder ([place])    hgba1c to be checked , minimize simple carbs. Increase exercise as tolerated. Continue current meds      Essential hypertension    Well controlled, no changes to meds. Encouraged heart healthy diet such as the DASH diet and exercise as tolerated.       Hyperlipidemia  associated with type 2 diabetes mellitus ([place])    Tolerating statin, encouraged heart healthy diet, avoid trans fats, minimize simple carbs and saturated fats. Increase exercise as tolerated       Other Visit Diagnoses    Uncontrolled type 2 diabetes mellitus with hyperglycemia ([place])    -  Primary   Relevant Orders   Lipid panel   Hemoglobin A1c   Comprehensive metabolic panel   Microalbumin / creatinine urine ratio   CBC with Differential/Platelet   Hyperlipidemia, unspecified hyperlipidemia type       Relevant Orders   Lipid panel   Hemoglobin A1c   Comprehensive metabolic panel   Microalbumin / creatinine urine ratio   CBC with Differential/Platelet   Primary hypertension       Relevant Orders   Lipid panel   Hemoglobin A1c   Comprehensive metabolic panel   Microalbumin / creatinine urine ratio   CBC with Differential/Platelet   Vitamin D deficiency       Relevant Medications   Vitamin D, Ergocalciferol, (DRISDOL) 1.25 MG (50000 UNIT) CAPS capsule   Other Relevant Orders   Vitamin D (25 hydroxy)   Need for pneumococcal vaccination       Relevant Orders   Pneumococcal polysaccharide vaccine 23-valent greater than or equal to 2yo subcutaneous/IM (Completed)      Follow-up: Return in about 3 months (around 06/05/2020), or if symptoms worsen or fail to improve, for fasting, annual exam.  [name], DO

## 2020-03-08 NOTE — Assessment & Plan Note (Signed)
Tolerating statin, encouraged heart healthy diet, avoid trans fats, minimize simple carbs and saturated fats. Increase exercise as tolerated 

## 2020-03-08 NOTE — Patient Instructions (Signed)

## 2020-03-09 LAB — LIPID PANEL
Cholesterol: 133 mg/dL (ref 0–200)
HDL: 39.6 mg/dL (ref >39.00)
LDL Cholesterol: 54 mg/dL (ref 0–99)
NonHDL: 93.77
Total CHOL/HDL Ratio: 3
Triglycerides: 198 mg/dL — ABNORMAL HIGH (ref 0.0–149.0)
VLDL: 39.6 mg/dL (ref 0.0–40.0)

## 2020-03-09 LAB — COMPREHENSIVE METABOLIC PANEL
ALT: 19 U/L (ref 0–35)
AST: 28 U/L (ref 0–37)
Albumin: 4.4 g/dL (ref 3.5–5.2)
Alkaline Phosphatase: 41 U/L (ref 39–117)
BUN: 23 mg/dL (ref 6–23)
CO2: 28 mEq/L (ref 19–32)
Calcium: 10 mg/dL (ref 8.4–10.5)
Chloride: 100 mEq/L (ref 96–112)
Creatinine, Ser: 1.43 mg/dL — ABNORMAL HIGH (ref 0.40–1.20)
GFR: 37.71 mL/min — ABNORMAL LOW (ref >60.00)
Glucose, Bld: 159 mg/dL — ABNORMAL HIGH (ref 70–99)
Potassium: 3.7 mEq/L (ref 3.5–5.1)
Sodium: 137 mEq/L (ref 135–145)
Total Bilirubin: 0.5 mg/dL (ref 0.2–1.2)
Total Protein: 7.6 g/dL (ref 6.0–8.3)

## 2020-03-09 LAB — CBC WITH DIFFERENTIAL/PLATELET
Basophils Absolute: 0 10*3/uL (ref 0.0–0.1)
Basophils Relative: 0.7 % (ref 0.0–3.0)
Eosinophils Absolute: 0.2 10*3/uL (ref 0.0–0.7)
Eosinophils Relative: 2.6 % (ref 0.0–5.0)
HCT: 35.5 % — ABNORMAL LOW (ref 36.0–46.0)
Hemoglobin: 12.2 g/dL (ref 12.0–15.0)
Lymphocytes Relative: 36.2 % (ref 12.0–46.0)
Lymphs Abs: 2.6 10*3/uL (ref 0.7–4.0)
MCHC: 34.4 g/dL (ref 30.0–36.0)
MCV: 97.7 fl (ref 78.0–100.0)
Monocytes Absolute: 0.5 10*3/uL (ref 0.1–1.0)
Monocytes Relative: 6.8 % (ref 3.0–12.0)
Neutro Abs: 3.8 10*3/uL (ref 1.4–7.7)
Neutrophils Relative %: 53.7 % (ref 43.0–77.0)
Platelets: 232 10*3/uL (ref 150.0–400.0)
RBC: 3.63 Mil/uL — ABNORMAL LOW (ref 3.87–5.11)
RDW: 13.3 % (ref 11.5–15.5)
WBC: 7.1 10*3/uL (ref 4.0–10.5)

## 2020-03-09 LAB — MICROALBUMIN / CREATININE URINE RATIO
Creatinine,U: 161.2 mg/dL
Microalb Creat Ratio: 1.1 mg/g (ref 0.0–30.0)
Microalb, Ur: 1.8 mg/dL (ref 0.0–1.9)

## 2020-03-09 LAB — VITAMIN D 25 HYDROXY (VIT D DEFICIENCY, FRACTURES): VITD: 76.53 ng/mL (ref 30.00–100.00)

## 2020-03-09 LAB — HEMOGLOBIN A1C: Hgb A1c MFr Bld: 6.1 % (ref 4.6–6.5)

## 2020-03-10 ENCOUNTER — Other Ambulatory Visit: Payer: Self-pay | Admitting: Family Medicine

## 2020-03-10 DIAGNOSIS — N289 Disorder of kidney and ureter, unspecified: Secondary | ICD-10-CM

## 2020-03-15 DIAGNOSIS — Z0100 Encounter for examination of eyes and vision without abnormal findings: Secondary | ICD-10-CM | POA: Diagnosis not present

## 2020-04-18 ENCOUNTER — Other Ambulatory Visit: Payer: Self-pay | Admitting: Family Medicine

## 2020-04-18 DIAGNOSIS — I10 Essential (primary) hypertension: Secondary | ICD-10-CM

## 2020-05-03 DIAGNOSIS — G4700 Insomnia, unspecified: Secondary | ICD-10-CM | POA: Diagnosis not present

## 2020-05-03 DIAGNOSIS — B009 Herpesviral infection, unspecified: Secondary | ICD-10-CM | POA: Diagnosis not present

## 2020-05-03 DIAGNOSIS — J309 Allergic rhinitis, unspecified: Secondary | ICD-10-CM | POA: Diagnosis not present

## 2020-05-03 DIAGNOSIS — Z008 Encounter for other general examination: Secondary | ICD-10-CM | POA: Diagnosis not present

## 2020-05-03 DIAGNOSIS — I25119 Atherosclerotic heart disease of native coronary artery with unspecified angina pectoris: Secondary | ICD-10-CM | POA: Diagnosis not present

## 2020-05-03 DIAGNOSIS — R69 Illness, unspecified: Secondary | ICD-10-CM | POA: Diagnosis not present

## 2020-05-03 DIAGNOSIS — I10 Essential (primary) hypertension: Secondary | ICD-10-CM | POA: Diagnosis not present

## 2020-05-03 DIAGNOSIS — E785 Hyperlipidemia, unspecified: Secondary | ICD-10-CM | POA: Diagnosis not present

## 2020-05-03 DIAGNOSIS — E1151 Type 2 diabetes mellitus with diabetic peripheral angiopathy without gangrene: Secondary | ICD-10-CM | POA: Diagnosis not present

## 2020-05-03 DIAGNOSIS — E11620 Type 2 diabetes mellitus with diabetic dermatitis: Secondary | ICD-10-CM | POA: Diagnosis not present

## 2020-06-07 ENCOUNTER — Other Ambulatory Visit: Payer: Self-pay | Admitting: Family Medicine

## 2020-06-07 DIAGNOSIS — E785 Hyperlipidemia, unspecified: Secondary | ICD-10-CM

## 2020-06-07 MED ORDER — FENOFIBRATE 48 MG PO TABS: 48.0000 mg | ORAL_TABLET | Freq: Every day | ORAL | 1 refills | Status: DC

## 2020-09-27 ENCOUNTER — Ambulatory Visit: Payer: Medicare HMO | Admitting: Family Medicine

## 2020-10-04 ENCOUNTER — Encounter: Payer: Self-pay | Admitting: Family Medicine

## 2020-10-04 ENCOUNTER — Other Ambulatory Visit: Payer: Self-pay

## 2020-10-04 ENCOUNTER — Ambulatory Visit (INDEPENDENT_AMBULATORY_CARE_PROVIDER_SITE_OTHER): Payer: Medicare HMO | Admitting: Family Medicine

## 2020-10-04 VITALS — BP 114/70 | HR 61 | Temp 98.8°F | Resp 18 | Ht 60.0 in | Wt 107.4 lb

## 2020-10-04 DIAGNOSIS — R634 Abnormal weight loss: Secondary | ICD-10-CM | POA: Diagnosis not present

## 2020-10-04 DIAGNOSIS — I10 Essential (primary) hypertension: Secondary | ICD-10-CM

## 2020-10-04 DIAGNOSIS — E785 Hyperlipidemia, unspecified: Secondary | ICD-10-CM

## 2020-10-04 DIAGNOSIS — E1165 Type 2 diabetes mellitus with hyperglycemia: Secondary | ICD-10-CM | POA: Diagnosis not present

## 2020-10-04 DIAGNOSIS — E1169 Type 2 diabetes mellitus with other specified complication: Secondary | ICD-10-CM | POA: Diagnosis not present

## 2020-10-04 MED ORDER — OLMESARTAN MEDOXOMIL 20 MG PO TABS: 20.0000 mg | ORAL_TABLET | Freq: Every day | ORAL | 1 refills | Status: DC

## 2020-10-04 MED ORDER — AMLODIPINE BESYLATE 5 MG PO TABS
5.0000 mg | ORAL_TABLET | Freq: Every day | ORAL | 1 refills | Status: DC
Start: 2020-10-04 — End: 2021-01-03

## 2020-10-04 NOTE — Patient Instructions (Signed)
Carbohydrate Counting for Diabetes Mellitus, Adult Carbohydrate counting is a method of keeping track of how many carbohydrates you eat. Eating carbohydrates naturally increases the amount of sugar (glucose) in the blood. Counting how many carbohydrates you eat improves your blood glucose control, which helps you manage your diabetes. It is important to know how many carbohydrates you can safely have in each meal. This is different for every person. A dietitian can help you make a meal plan and calculate how many carbohydrates you should have at each meal and snack. What foods contain carbohydrates? Carbohydrates are found in the following foods: Grains, such as breads and cereals. Dried beans and soy products. Starchy vegetables, such as potatoes, peas, and corn. Fruit and fruit juices. Milk and yogurt. Sweets and snack foods, such as cake, cookies, candy, chips, and soft drinks. How do I count carbohydrates in foods? There are two ways to count carbohydrates in food. You can read food labels or learn standard serving sizes of foods. You can use either of the methods or a combination of both. Using the Nutrition Facts label The Nutrition Facts list is included on the labels of almost all packaged foods and beverages in the [place] It includes: The serving size. Information about nutrients in each serving, including the grams (g) of carbohydrate per serving. To use the Nutrition Facts: Decide how many servings you will have. Multiply the number of servings by the number of carbohydrates per serving. The resulting number is the total amount of carbohydrates that you will be having. Learning the standard serving sizes of foods When you eat carbohydrate foods that are not packaged or do not include Nutrition Facts on the label, you need to measure the servings in order to count the amount of carbohydrates. Measure the foods that you will eat with a food scale or measuring cup, if needed. Decide how  many standard-size servings you will eat. Multiply the number of servings by 15. For foods that contain carbohydrates, one serving equals 15 g of carbohydrates. For example, if you eat 2 cups or 10 oz (300 g) of strawberries, you will have eaten 2 servings and 30 g of carbohydrates (2 servings x 15 g = 30 g). For foods that have more than one food mixed, such as soups and casseroles, you must count the carbohydrates in each food that is included. The following list contains standard serving sizes of common carbohydrate-rich foods. Each of these servings has about 15 g of carbohydrates: 1 slice of bread. 1 six-inch (15 cm) tortilla. ? cup or 2 oz (53 g) cooked rice or pasta.  cup or 3 oz (85 g) cooked or canned, drained and rinsed beans or lentils.  cup or 3 oz (85 g) starchy vegetable, such as peas, corn, or squash.  cup or 4 oz (120 g) hot cereal.  cup or 3 oz (85 g) boiled or mashed potatoes, or  or 3 oz (85 g) of a large baked potato.  cup or 4 fl oz (118 mL) fruit juice. 1 cup or 8 fl oz (237 mL) milk. 1 small or 4 oz (106 g) apple.  or 2 oz (63 g) of a medium banana. 1 cup or 5 oz (150 g) strawberries. 3 cups or 1 oz (24 g) popped popcorn. What is an example of carbohydrate counting? To calculate the number of carbohydrates in this sample meal, follow the steps shown below. Sample meal 3 oz (85 g) chicken breast. ? cup or 4 oz (106 g) brown   rice.  cup or 3 oz (85 g) corn. 1 cup or 8 fl oz (237 mL) milk. 1 cup or 5 oz (150 g) strawberries with sugar-free whipped topping. Carbohydrate calculation Identify the foods that contain carbohydrates: Rice. Corn. Milk. Strawberries. Calculate how many servings you have of each food: 2 servings rice. 1 serving corn. 1 serving milk. 1 serving strawberries. Multiply each number of servings by 15 g: 2 servings rice x 15 g = 30 g. 1 serving corn x 15 g = 15 g. 1 serving milk x 15 g = 15 g. 1 serving strawberries x 15 g = 15  g. Add together all of the amounts to find the total grams of carbohydrates eaten: 30 g + 15 g + 15 g + 15 g = 75 g of carbohydrates total. What are tips for following this plan? Shopping Develop a meal plan and then make a shopping list. Buy fresh and frozen vegetables, fresh and frozen fruit, dairy, eggs, beans, lentils, and whole grains. Look at food labels. Choose foods that have more fiber and less sugar. Avoid processed foods and foods with added sugars. Meal planning Aim to have the same amount of carbohydrates at each meal and for each snack time. Plan to have regular, balanced meals and snacks. Where to find more information American Diabetes Association: www.diabetes.org Centers for Disease Control and Prevention: www.cdc.gov Summary Carbohydrate counting is a method of keeping track of how many carbohydrates you eat. Eating carbohydrates naturally increases the amount of sugar (glucose) in the blood. Counting how many carbohydrates you eat improves your blood glucose control, which helps you manage your diabetes. A dietitian can help you make a meal plan and calculate how many carbohydrates you should have at each meal and snack. This information is not intended to replace advice given to you by your health care provider. Make sure you discuss any questions you have with your health care provider. Document Revised: 01/02/2019 Document Reviewed: 01/03/2019 Elsevier Patient Education  2021 Elsevier Inc.  

## 2020-10-04 NOTE — Assessment & Plan Note (Signed)
Well controlled, no changes to meds. Encouraged heart healthy diet such as the DASH diet and exercise as tolerated.  °

## 2020-10-04 NOTE — Assessment & Plan Note (Signed)
hgba1c  To be checked , minimize simple carbs. Increase exercise as tolerated. Continue current meds

## 2020-10-04 NOTE — Progress Notes (Signed)
Established Patient Office Visit  Subjective:  Patient ID: Dominique Hayes, female    DOB: 01/19/1950  Age: 70 y.o. MRN: [id number]  CC:  Chief Complaint  Patient presents with   Diabetes   Hyperlipidemia   Hypertension   Follow-up    HPI Dominique Hayes presents for f/u dm, chol and bp   her bs are running  low -- she has lost 20 lbs since her last visit and she has not been trying  Pt refuses flu shot  HYPERTENSION  Blood pressure range-not checking   Chest pain- no      Dyspnea- no Lightheadedness- no   Edema- no Other side effects - no   Medication compliance: good Low salt diet- yes  DIABETES  Blood Sugar ranges-running low  Polyuria- no New Visual problems- no Hypoglycemic symptoms- yes Other side effects-no Medication compliance - good    HYPERLIPIDEMIA  Medication compliance- good RUQ pain- no  Muscle aches- no Other side effects-no     Past Medical History:  Diagnosis Date   DM (diabetes mellitus) (HCC)    Hepatitis B    Hyperlipidemia    Hypertension    Mild diastolic dysfunction     Past Surgical History:  Procedure Laterality Date   ABDOMINAL HYSTERECTOMY     TAH-- complications from D &C    Family History  Problem Relation Age of Onset   Hypertension Mother    Hypertension Father    Hypertension Sister    Hypertension Sister     Social History   Socioeconomic History   Marital status: Married    Spouse name: Not on file   Number of children: 1   Years of education: Not on file   Highest education level: Not on file  Occupational History   Occupation: nail tech  Tobacco Use   Smoking status: Never   Smokeless tobacco: Never  Substance and Sexual Activity   Alcohol use: No    Alcohol/week: 0.0 standard drinks   Drug use: No   Sexual activity: Yes  Other Topics Concern   Not on file  Social History Narrative   Marital status: married      Employment: employed; p/t Company secretary   Exercise--- treadmill everday for 30 min    Social Determinants of Radio broadcast assistant Strain: Not on file  Food Insecurity: Not on file  Transportation Needs: Not on file  Physical Activity: Not on file  Stress: Not on file  Social Connections: Not on file  Intimate Partner Violence: Not on file    Outpatient Medications Prior to Visit  Medication Sig Dispense Refill   atorvastatin (LIPITOR) 80 MG tablet TAKE 1 TABLET BY MOUTH EVERY DAY 90 tablet 1   blood glucose meter kit and supplies KIT Dispense based on patient and insurance preference. Use to test sugar once a day.  E11.51 1 each 0   chlorthalidone (HYGROTON) 25 MG tablet TAKE 1 TABLET (25 MG TOTAL) BY MOUTH DAILY. PLEASE HAVE PATIENT TO CALL FOR VIRTUAL VISIT FOR FOLLOW UP 90 tablet 1   [facility] ASPIRIN LOW DOSE 81 MG EC tablet TAKE 1 TABLET BY MOUTH DAILY. 30 tablet 3   fenofibrate (TRICOR) 48 MG tablet Take 1 tablet (48 mg total) by mouth daily. 90 tablet 1   glucose blood test strip Use to test sugar once a day.  E11.51 100 each 1   Lancets MISC Use to test sugar once a day.  E11.51 100 each  1   loratadine (CLARITIN) 10 MG tablet TAKE 1 TABLET BY MOUTH EVERY DAY 90 tablet 3   MEGARED OMEGA-3 KRILL OIL 500 MG CAPS Take 1 capsule by mouth daily. 30 capsule 2   meloxicam (MOBIC) 15 MG tablet Take 1 tablet (15 mg total) by mouth daily as needed for pain. 90 tablet 0   metoprolol tartrate (LOPRESSOR) 25 MG tablet Take 1 tablet (25 mg total) by mouth 2 (two) times daily. 180 tablet 1   mometasone (ELOCON) 0.1 % cream APPLY TOPICALLY TO AFFECTED AREA EVERY DAY 135 g 0   nitroGLYCERIN (NITROSTAT) 0.4 MG SL tablet Place 1 tablet (0.4 mg total) under the tongue every 5 (five) minutes as needed for chest pain. 30 tablet 0   nystatin cream (MYCOSTATIN) Apply 1 application topically 2 (two) times daily. 30 g 0   olmesartan (BENICAR) 20 MG tablet TAKE 1 TABLET BY MOUTH EVERY DAY 90 tablet 1   Saxagliptin-Metformin 2.05-998 MG TB24 Take 2 tablets by mouth daily. 180 tablet 1    traZODone (DESYREL) 50 MG tablet TAKE 0.5-1 TABLETS (25-50 MG TOTAL) BY MOUTH AT BEDTIME AS NEEDED FOR SLEEP. 90 tablet 1   valACYclovir (VALTREX) 1000 MG tablet Take 1 tablet (1,000 mg total) by mouth 3 (three) times daily. 21 tablet 2   VASCEPA 1 g capsule TAKE 2 CAPSULES (2 G TOTAL) BY MOUTH 2 (TWO) TIMES DAILY. 120 capsule 2   Vitamin D, Ergocalciferol, (DRISDOL) 1.25 MG (50000 UNIT) CAPS capsule Take 1 capsule (50,000 Units total) by mouth every 7 (seven) days. 12 capsule 3   SitaGLIPtin-MetFORMIN HCl (JANUMET XR) 50-1000 MG TB24 TAKE 2 TABLETS BY MOUTH EVERY DAY. Pt needs OV for further refills 60 tablet 2   benzonatate (TESSALON) 200 MG capsule Take 1 capsule (200 mg total) by mouth 2 (two) times daily as needed for cough. (Patient not taking: No sig reported) 20 capsule 0   chlorpheniramine-HYDROcodone (TUSSIONEX PENNKINETIC ER) 10-8 MG/5ML SUER Take 5 mLs by mouth at bedtime as needed for cough. (Patient not taking: No sig reported) 140 mL 0   No facility-administered medications prior to visit.    No Known Allergies  ROS Review of Systems  Constitutional:  Negative for appetite change, diaphoresis, fatigue and unexpected weight change.  Eyes:  Negative for pain, redness and visual disturbance.  Respiratory:  Negative for cough, chest tightness, shortness of breath and wheezing.   Cardiovascular:  Negative for chest pain, palpitations and leg swelling.  Endocrine: Negative for cold intolerance, heat intolerance, polydipsia, polyphagia and polyuria.  Genitourinary:  Negative for difficulty urinating, dysuria and frequency.  Neurological:  Negative for dizziness, light-headedness, numbness and headaches.     Objective:    Physical Exam Vitals and nursing note reviewed.  Constitutional:      Appearance: She is well-developed.  HENT:     Head: Normocephalic and atraumatic.  Eyes:     Conjunctiva/sclera: Conjunctivae normal.  Neck:     Thyroid: No thyromegaly.     Vascular: No  carotid bruit or JVD.  Cardiovascular:     Rate and Rhythm: Normal rate and regular rhythm.     Heart sounds: Normal heart sounds. No murmur heard. Pulmonary:     Effort: Pulmonary effort is normal. No respiratory distress.     Breath sounds: Normal breath sounds. No wheezing or rales.  Chest:     Chest wall: No tenderness.  Musculoskeletal:     Cervical back: Normal range of motion and neck supple.  Neurological:  Mental Status: She is alert and oriented to person, place, and time.    BP 114/70 (BP Location: Left Arm, Patient Position: Sitting, Cuff Size: Normal)   Pulse 61   Temp 98.8 F (37.1 C) (Oral)   Resp 18   Ht 5' (1.524 m)   Wt 107 lb 6.4 oz (48.7 kg)   SpO2 98%   BMI 20.98 kg/m  Wt Readings from Last 3 Encounters:  10/04/20 107 lb 6.4 oz (48.7 kg)  03/08/20 127 lb 6.4 oz (57.8 kg)  11/15/18 127 lb 3.2 oz (57.7 kg)     Health Maintenance Due  Topic Date Due   COLONOSCOPY (Pts 45-24yr Insurance coverage will need to be confirmed)  Never done   OPHTHALMOLOGY EXAM  01/28/2015   DEXA SCAN  Never done   Zoster Vaccines- Shingrix (2 of 2) 12/10/2017   MAMMOGRAM  04/04/2018   COVID-19 Vaccine (4 - [facility]oster for [facility]) 04/13/2020   INFLUENZA VACCINE  08/16/2020   HEMOGLOBIN A1C  09/05/2020    There are no preventive care reminders to display for this patient.  Lab Results  Component Value Date   TSH 1.05 05/26/2013   Lab Results  Component Value Date   WBC 7.1 03/08/2020   HGB 12.2 03/08/2020   HCT 35.5 (L) 03/08/2020   MCV 97.7 03/08/2020   PLT 232.0 03/08/2020   Lab Results  Component Value Date   NA 137 03/08/2020   K 3.7 03/08/2020   CO2 28 03/08/2020   GLUCOSE 159 (H) 03/08/2020   BUN 23 03/08/2020   CREATININE 1.43 (H) 03/08/2020   BILITOT 0.5 03/08/2020   ALKPHOS 41 03/08/2020   AST 28 03/08/2020   ALT 19 03/08/2020   PROT 7.6 03/08/2020   ALBUMIN 4.4 03/08/2020   CALCIUM 10.0 03/08/2020   GFR 37.71 (L) 03/08/2020   Lab  Results  Component Value Date   CHOL 133 03/08/2020   Lab Results  Component Value Date   HDL 39.60 03/08/2020   Lab Results  Component Value Date   LDLCALC 54 03/08/2020   Lab Results  Component Value Date   TRIG 198.0 (H) 03/08/2020   Lab Results  Component Value Date   CHOLHDL 3 03/08/2020   Lab Results  Component Value Date   HGBA1C 6.1 03/08/2020      Assessment & Plan:   Problem List Items Addressed This Visit       Unprioritized   Essential hypertension    Well controlled, no changes to meds. Encouraged heart healthy diet such as the DASH diet and exercise as tolerated.       Relevant Medications   olmesartan (BENICAR) 20 MG tablet   amLODipine (NORVASC) 5 MG tablet   Hyperlipidemia associated with type 2 diabetes mellitus ([facility]    Tolerating statin, encouraged heart healthy diet, avoid trans fats, minimize simple carbs and saturated fats. Increase exercise as tolerated      Relevant Medications   olmesartan (BENICAR) 20 MG tablet   Other Relevant Orders   Lipid panel   CBC with Differential/Platelet   Comprehensive metabolic panel   Hemoglobin A1c   Microalbumin / creatinine urine ratio   Uncontrolled type 2 diabetes mellitus with hyperglycemia ([place] - Primary    hgba1c  To be checked , minimize simple carbs. Increase exercise as tolerated. Continue current meds       Relevant Medications   olmesartan (BENICAR) 20 MG tablet   Other Relevant Orders   Lipid panel   CBC  with Differential/Platelet   Comprehensive metabolic panel   Hemoglobin A1c   Microalbumin / creatinine urine ratio   Other Visit Diagnoses     Primary hypertension       Relevant Medications   olmesartan (BENICAR) 20 MG tablet   amLODipine (NORVASC) 5 MG tablet   Other Relevant Orders   Lipid panel   CBC with Differential/Platelet   Comprehensive metabolic panel   Hemoglobin A1c   Microalbumin / creatinine urine ratio   Weight loss       Relevant Orders   Ambulatory  referral to Gastroenterology       Meds ordered this encounter  Medications   olmesartan (BENICAR) 20 MG tablet    Sig: Take 1 tablet (20 mg total) by mouth daily.    Dispense:  90 tablet    Refill:  1   amLODipine (NORVASC) 5 MG tablet    Sig: Take 1 tablet (5 mg total) by mouth daily.    Dispense:  90 tablet    Refill:  1    Follow-up: Return in about 3 months (around 01/03/2021), or if symptoms worsen or fail to improve, for hypertension, hyperlipidemia, diabetes II--- bring your medication with you.    [name], DO

## 2020-10-04 NOTE — Assessment & Plan Note (Signed)
Tolerating statin, encouraged heart healthy diet, avoid trans fats, minimize simple carbs and saturated fats. Increase exercise as tolerated 

## 2020-10-05 LAB — COMPREHENSIVE METABOLIC PANEL
ALT: 19 U/L (ref 0–35)
AST: 25 U/L (ref 0–37)
Albumin: 4.4 g/dL (ref 3.5–5.2)
Alkaline Phosphatase: 31 U/L — ABNORMAL LOW (ref 39–117)
BUN: 32 mg/dL — ABNORMAL HIGH (ref 6–23)
CO2: 28 mEq/L (ref 19–32)
Calcium: 10.1 mg/dL (ref 8.4–10.5)
Chloride: 99 mEq/L (ref 96–112)
Creatinine, Ser: 1.44 mg/dL — ABNORMAL HIGH (ref 0.40–1.20)
GFR: 37.24 mL/min — ABNORMAL LOW (ref >60.00)
Glucose, Bld: 150 mg/dL — ABNORMAL HIGH (ref 70–99)
Potassium: 4.6 mEq/L (ref 3.5–5.1)
Sodium: 137 mEq/L (ref 135–145)
Total Bilirubin: 0.7 mg/dL (ref 0.2–1.2)
Total Protein: 7.4 g/dL (ref 6.0–8.3)

## 2020-10-05 LAB — LIPID PANEL
Cholesterol: 107 mg/dL (ref 0–200)
HDL: 42.7 mg/dL (ref >39.00)
LDL Cholesterol: 40 mg/dL (ref 0–99)
NonHDL: 64.65
Total CHOL/HDL Ratio: 3
Triglycerides: 123 mg/dL (ref 0.0–149.0)
VLDL: 24.6 mg/dL (ref 0.0–40.0)

## 2020-10-05 LAB — CBC WITH DIFFERENTIAL/PLATELET
Basophils Absolute: 0 10*3/uL (ref 0.0–0.1)
Basophils Relative: 0.7 % (ref 0.0–3.0)
Eosinophils Absolute: 0.1 10*3/uL (ref 0.0–0.7)
Eosinophils Relative: 0.7 % (ref 0.0–5.0)
HCT: 37.6 % (ref 36.0–46.0)
Hemoglobin: 12.8 g/dL (ref 12.0–15.0)
Lymphocytes Relative: 31 % (ref 12.0–46.0)
Lymphs Abs: 2.2 10*3/uL (ref 0.7–4.0)
MCHC: 34 g/dL (ref 30.0–36.0)
MCV: 95.9 fl (ref 78.0–100.0)
Monocytes Absolute: 0.5 10*3/uL (ref 0.1–1.0)
Monocytes Relative: 7.2 % (ref 3.0–12.0)
Neutro Abs: 4.3 10*3/uL (ref 1.4–7.7)
Neutrophils Relative %: 60.4 % (ref 43.0–77.0)
Platelets: 226 10*3/uL (ref 150.0–400.0)
RBC: 3.92 Mil/uL (ref 3.87–5.11)
RDW: 13.1 % (ref 11.5–15.5)
WBC: 7.1 10*3/uL (ref 4.0–10.5)

## 2020-10-05 LAB — MICROALBUMIN / CREATININE URINE RATIO
Creatinine,U: 108.1 mg/dL
Microalb Creat Ratio: 0.6 mg/g (ref 0.0–30.0)
Microalb, Ur: <0.7 mg/dL (ref 0.0–1.9)

## 2020-10-05 LAB — HEMOGLOBIN A1C: Hgb A1c MFr Bld: 5.9 % (ref 4.6–6.5)

## 2020-10-18 DIAGNOSIS — H903 Sensorineural hearing loss, bilateral: Secondary | ICD-10-CM | POA: Diagnosis not present

## 2020-10-19 DIAGNOSIS — H903 Sensorineural hearing loss, bilateral: Secondary | ICD-10-CM | POA: Diagnosis not present

## 2020-10-20 ENCOUNTER — Other Ambulatory Visit: Payer: Self-pay | Admitting: Family Medicine

## 2020-10-20 DIAGNOSIS — I10 Essential (primary) hypertension: Secondary | ICD-10-CM

## 2020-11-25 ENCOUNTER — Other Ambulatory Visit: Payer: Self-pay | Admitting: Family Medicine

## 2020-11-25 DIAGNOSIS — E1165 Type 2 diabetes mellitus with hyperglycemia: Secondary | ICD-10-CM

## 2020-11-29 ENCOUNTER — Telehealth: Payer: Self-pay | Admitting: *Deleted

## 2020-11-29 NOTE — Chronic Care Management (AMB) (Signed)
  Chronic Care Management   Outreach Note  11/29/2020 Name: Dominique Hayes MRN: [id number] DOB: March 28, 1950  Dominique Hayes is a 70 y.o. year old female who is a primary care patient of Donato Schultz, DO. I reached out to Hector Brunswick by phone today in response to a referral sent by Ms. Dominique Hayes's primary care provider.  An unsuccessful telephone outreach was attempted today. The patient was referred to the case management team for assistance with care management and care coordination.   Follow Up Plan: A HIPAA compliant phone message was left for the patient providing contact information and requesting a return call.  If patient returns call to provider office, please advise to call Embedded Care Management Care Guide Aarian Griffie at [phone]  Burman Nieves, CCMA Care Guide, Embedded Care Coordination [facility]  Care Management  Direct Dial: [phone]

## 2020-12-03 NOTE — Chronic Care Management (AMB) (Signed)
  Chronic Care Management   Outreach Note  12/03/2020 Name: Dominique Hayes MRN: [id number] DOB: [birth year]  Dominique Hayes is a 70 y.o. year old female who is a primary care patient of Donato Schultz, DO. I reached out to Dominique Hayes by phone today in response to a referral sent by Dominique Hayes's primary care provider.  A second unsuccessful telephone outreach was attempted today. The patient was referred to the case management team for assistance with care management and care coordination.   Follow Up Plan: A HIPAA compliant phone message was left for the patient providing contact information and requesting a return call.  If patient returns call to provider office, please advise to call Embedded Care Management Care Guide Dominique Hayes at [phone]  Dominique Hayes, CCMA Care Guide, Embedded Care Coordination [facility]  Care Management  Direct Dial: [phone]

## 2020-12-08 NOTE — Chronic Care Management (AMB) (Signed)
  Chronic Care Management   Outreach Note  12/08/2020 Name: CELICIA MINAHAN MRN: [id number] DOB: Jun 10, 1950  TKEYAH BURKMAN is a 70 y.o. year old female who is a primary care patient of Donato Schultz, DO. I reached out to Hector Brunswick by phone today in response to a referral sent by Ms. Javier Docker Christner's primary care provider.  Third unsuccessful telephone outreach was attempted today. The patient was referred to the case management team for assistance with care management and care coordination. The patient's primary care provider has been notified of our unsuccessful attempts to make or maintain contact with the patient. The care management team is pleased to engage with this patient at any time in the future should he/she be interested in assistance from the care management team.   Follow Up Plan: We have been unable to make contact with the patient for follow up. The care management team is available to follow up with the patient after provider conversation with the patient regarding recommendation for care management engagement and subsequent re-referral to the care management team.   Burman Nieves, CCMA Care Guide, Embedded Care Coordination [facility]  Care Management  Direct Dial: [phone]

## 2021-01-03 ENCOUNTER — Ambulatory Visit (INDEPENDENT_AMBULATORY_CARE_PROVIDER_SITE_OTHER): Payer: Medicare HMO | Admitting: Family Medicine

## 2021-01-03 ENCOUNTER — Encounter: Payer: Self-pay | Admitting: Family Medicine

## 2021-01-03 ENCOUNTER — Other Ambulatory Visit: Payer: Self-pay | Admitting: Family Medicine

## 2021-01-03 VITALS — BP 130/80 | HR 62 | Temp 98.1°F | Resp 16 | Ht 60.0 in | Wt 114.0 lb

## 2021-01-03 DIAGNOSIS — E119 Type 2 diabetes mellitus without complications: Secondary | ICD-10-CM | POA: Diagnosis not present

## 2021-01-03 DIAGNOSIS — G4700 Insomnia, unspecified: Secondary | ICD-10-CM

## 2021-01-03 DIAGNOSIS — E559 Vitamin D deficiency, unspecified: Secondary | ICD-10-CM | POA: Diagnosis not present

## 2021-01-03 DIAGNOSIS — Z1231 Encounter for screening mammogram for malignant neoplasm of breast: Secondary | ICD-10-CM

## 2021-01-03 DIAGNOSIS — M159 Polyosteoarthritis, unspecified: Secondary | ICD-10-CM

## 2021-01-03 DIAGNOSIS — E2839 Other primary ovarian failure: Secondary | ICD-10-CM | POA: Diagnosis not present

## 2021-01-03 DIAGNOSIS — I10 Essential (primary) hypertension: Secondary | ICD-10-CM

## 2021-01-03 DIAGNOSIS — E785 Hyperlipidemia, unspecified: Secondary | ICD-10-CM | POA: Diagnosis not present

## 2021-01-03 DIAGNOSIS — I25700 Atherosclerosis of coronary artery bypass graft(s), unspecified, with unstable angina pectoris: Secondary | ICD-10-CM | POA: Diagnosis not present

## 2021-01-03 DIAGNOSIS — E1169 Type 2 diabetes mellitus with other specified complication: Secondary | ICD-10-CM | POA: Diagnosis not present

## 2021-01-03 DIAGNOSIS — Z1211 Encounter for screening for malignant neoplasm of colon: Secondary | ICD-10-CM

## 2021-01-03 DIAGNOSIS — E1165 Type 2 diabetes mellitus with hyperglycemia: Secondary | ICD-10-CM

## 2021-01-03 LAB — COMPREHENSIVE METABOLIC PANEL
ALT: 17 U/L (ref 0–35)
AST: 21 U/L (ref 0–37)
Albumin: 4.2 g/dL (ref 3.5–5.2)
Alkaline Phosphatase: 35 U/L — ABNORMAL LOW (ref 39–117)
BUN: 18 mg/dL (ref 6–23)
CO2: 27 mEq/L (ref 19–32)
Calcium: 9.4 mg/dL (ref 8.4–10.5)
Chloride: 101 mEq/L (ref 96–112)
Creatinine, Ser: 1.19 mg/dL (ref 0.40–1.20)
GFR: 46.74 mL/min — ABNORMAL LOW (ref >60.00)
Glucose, Bld: 255 mg/dL — ABNORMAL HIGH (ref 70–99)
Potassium: 4.5 mEq/L (ref 3.5–5.1)
Sodium: 137 mEq/L (ref 135–145)
Total Bilirubin: 0.7 mg/dL (ref 0.2–1.2)
Total Protein: 7.2 g/dL (ref 6.0–8.3)

## 2021-01-03 LAB — LIPID PANEL
Cholesterol: 162 mg/dL (ref 0–200)
HDL: 37.3 mg/dL — ABNORMAL LOW (ref >39.00)
NonHDL: 124.87
Total CHOL/HDL Ratio: 4
Triglycerides: 231 mg/dL — ABNORMAL HIGH (ref 0.0–149.0)
VLDL: 46.2 mg/dL — ABNORMAL HIGH (ref 0.0–40.0)

## 2021-01-03 LAB — LDL CHOLESTEROL, DIRECT: Direct LDL: 97 mg/dL

## 2021-01-03 LAB — HEMOGLOBIN A1C: Hgb A1c MFr Bld: 6.1 % (ref 4.6–6.5)

## 2021-01-03 MED ORDER — MELOXICAM 15 MG PO TABS: 15.0000 mg | ORAL_TABLET | Freq: Every day | ORAL | 0 refills | Status: DC | PRN

## 2021-01-03 MED ORDER — TRAZODONE HCL 50 MG PO TABS: 25.0000 mg | ORAL_TABLET | Freq: Every evening | ORAL | 1 refills | Status: DC | PRN

## 2021-01-03 MED ORDER — FENOFIBRATE 48 MG PO TABS
48.0000 mg | ORAL_TABLET | Freq: Every day | ORAL | 1 refills | Status: DC
Start: 2021-01-03 — End: 2021-07-21

## 2021-01-03 MED ORDER — CHLORTHALIDONE 25 MG PO TABS
25.0000 mg | ORAL_TABLET | Freq: Every day | ORAL | 1 refills | Status: DC
Start: 2021-01-03 — End: 2021-07-11

## 2021-01-03 MED ORDER — AMLODIPINE BESYLATE 5 MG PO TABS: 5.0000 mg | ORAL_TABLET | Freq: Every day | ORAL | 1 refills | Status: DC

## 2021-01-03 MED ORDER — VITAMIN D (ERGOCALCIFEROL) 1.25 MG (50000 UNIT) PO CAPS: 50000.0000 [IU] | ORAL_CAPSULE | ORAL | 3 refills | Status: DC

## 2021-01-03 MED ORDER — ATORVASTATIN CALCIUM 80 MG PO TABS: 80.0000 mg | ORAL_TABLET | Freq: Every day | ORAL | 1 refills | Status: DC

## 2021-01-03 MED ORDER — SAXAGLIPTIN-METFORMIN ER 2.5-1000 MG PO TB24: 2.0000 | ORAL_TABLET | Freq: Every day | ORAL | 1 refills | Status: DC

## 2021-01-03 MED ORDER — ICOSAPENT ETHYL 1 G PO CAPS: 2.0000 g | ORAL_CAPSULE | Freq: Two times a day (BID) | ORAL | 2 refills | Status: DC

## 2021-01-03 MED ORDER — OLMESARTAN MEDOXOMIL 20 MG PO TABS: 20.0000 mg | ORAL_TABLET | Freq: Every day | ORAL | 1 refills | Status: DC

## 2021-01-03 MED ORDER — NITROGLYCERIN 0.4 MG SL SUBL: 0.4000 mg | SUBLINGUAL_TABLET | SUBLINGUAL | 0 refills | Status: DC | PRN

## 2021-01-03 MED ORDER — METOPROLOL TARTRATE 25 MG PO TABS: 25.0000 mg | ORAL_TABLET | Freq: Two times a day (BID) | ORAL | 1 refills | Status: DC

## 2021-01-03 NOTE — Patient Instructions (Signed)

## 2021-01-03 NOTE — Progress Notes (Signed)
Established Patient Office Visit  Subjective:  Patient ID: Dominique Hayes, female    DOB: 05/11/1950  Age: 70 y.o. MRN: [id number]  CC:  Chief Complaint  Patient presents with   Hypertension   Hyperlipidemia   Diabetes   Follow-up    HPI Dominique Hayes presents for f/u bp , cholesterol and dm.  HYPERTENSION  Blood pressure range-good  Chest pain- no      Dyspnea- no Lightheadedness- no   Edema- no Other side effects - no   Medication compliance: good Lowsalt diet- yes  DIABETES  Blood Sugar ranges-- good per pt   Polyuria- no New Visual problems- no Hypoglycemic symptoms- no Other side effects-no Medication compliance - good  Foot exam- today  HYPERLIPIDEMIA  Medication compliance- good RUQ pain- no  Muscle aches- no Other side effects-no      Past Surgical History:  Procedure Laterality Date   ABDOMINAL HYSTERECTOMY     TAH-- complications from D &C    Family History  Problem Relation Age of Onset   Hypertension Mother    Hypertension Father    Hypertension Sister    Hypertension Sister     Social History   Socioeconomic History   Marital status: Married    Spouse name: Not on file   Number of children: 1   Years of education: Not on file   Highest education level: Not on file  Occupational History   Occupation: nail tech  Tobacco Use   Smoking status: Never   Smokeless tobacco: Never  Substance and Sexual Activity   Alcohol use: No    Alcohol/week: 0.0 standard drinks   Drug use: No   Sexual activity: Yes  Other Topics Concern   Not on file  Social History Narrative   Marital status: married      Employment: employed; p/t Company secretary   Exercise--- treadmill everday for 30 min   Social Determinants of Radio broadcast assistant Strain: Not on file  Food Insecurity: Not on file  Transportation Needs: Not on file  Physical Activity: Not on file  Stress: Not on file  Social Connections: Not on file  Intimate Partner Violence: Not on  file    Outpatient Medications Prior to Visit  Medication Sig Dispense Refill   blood glucose meter kit and supplies KIT Dispense based on patient and insurance preference. Use to test sugar once a day.  E11.51 1 each 0   [facility] ASPIRIN LOW DOSE 81 MG EC tablet TAKE 1 TABLET BY MOUTH DAILY. 30 tablet 3   glucose blood test strip Use to test sugar once a day.  E11.51 100 each 1   Lancets MISC Use to test sugar once a day.  E11.51 100 each 1   loratadine (CLARITIN) 10 MG tablet TAKE 1 TABLET BY MOUTH EVERY DAY 90 tablet 3   MEGARED OMEGA-3 KRILL OIL 500 MG CAPS Take 1 capsule by mouth daily. 30 capsule 2   mometasone (ELOCON) 0.1 % cream APPLY TOPICALLY TO AFFECTED AREA EVERY DAY 135 g 0   nystatin cream (MYCOSTATIN) Apply 1 application topically 2 (two) times daily. 30 g 0   olmesartan (BENICAR) 20 MG tablet TAKE 1 TABLET BY MOUTH EVERY DAY 90 tablet 1   valACYclovir (VALTREX) 1000 MG tablet Take 1 tablet (1,000 mg total) by mouth 3 (three) times daily. 21 tablet 2   amLODipine (NORVASC) 5 MG tablet Take 1 tablet (5 mg total) by mouth daily. 90 tablet 1  atorvastatin (LIPITOR) 80 MG tablet TAKE 1 TABLET BY MOUTH EVERY DAY 90 tablet 1   chlorthalidone (HYGROTON) 25 MG tablet TAKE 1 TABLET (25 MG TOTAL) BY MOUTH DAILY. PLEASE HAVE PATIENT TO CALL FOR VIRTUAL VISIT FOR FOLLOW UP 90 tablet 1   fenofibrate (TRICOR) 48 MG tablet Take 1 tablet (48 mg total) by mouth daily. 90 tablet 1   meloxicam (MOBIC) 15 MG tablet Take 1 tablet (15 mg total) by mouth daily as needed for pain. 90 tablet 0   metoprolol tartrate (LOPRESSOR) 25 MG tablet TAKE 1 TABLET BY MOUTH TWICE A DAY 180 tablet 1   nitroGLYCERIN (NITROSTAT) 0.4 MG SL tablet Place 1 tablet (0.4 mg total) under the tongue every 5 (five) minutes as needed for chest pain. 30 tablet 0   olmesartan (BENICAR) 20 MG tablet Take 1 tablet (20 mg total) by mouth daily. 90 tablet 1   Saxagliptin-Metformin 2.05-998 MG TB24 Take 2 tablets by mouth daily. 180  tablet 1   traZODone (DESYREL) 50 MG tablet TAKE 0.5-1 TABLETS (25-50 MG TOTAL) BY MOUTH AT BEDTIME AS NEEDED FOR SLEEP. 90 tablet 1   VASCEPA 1 g capsule TAKE 2 CAPSULES (2 G TOTAL) BY MOUTH 2 (TWO) TIMES DAILY. 120 capsule 2   Vitamin D, Ergocalciferol, (DRISDOL) 1.25 MG (50000 UNIT) CAPS capsule Take 1 capsule (50,000 Units total) by mouth every 7 (seven) days. 12 capsule 3   No facility-administered medications prior to visit.    No Known Allergies  ROS Review of Systems  Constitutional:  Negative for fever.  HENT:  Negative for congestion.   Respiratory:  Negative for shortness of breath.   Cardiovascular:  Negative for chest pain, palpitations and leg swelling.  Gastrointestinal:  Negative for abdominal pain, blood in stool and nausea.  Genitourinary:  Negative for dysuria and frequency.  Skin:  Negative for rash.  Allergic/Immunologic: Negative for environmental allergies.  Neurological:  Negative for dizziness and headaches.  Psychiatric/Behavioral:  The patient is not nervous/anxious.      Objective:    Physical Exam Vitals and nursing note reviewed.  Constitutional:      Appearance: She is well-developed.  HENT:     Head: Normocephalic and atraumatic.  Eyes:     Conjunctiva/sclera: Conjunctivae normal.  Neck:     Thyroid: No thyromegaly.     Vascular: No carotid bruit or JVD.  Cardiovascular:     Rate and Rhythm: Normal rate and regular rhythm.     Heart sounds: Normal heart sounds. No murmur heard. Pulmonary:     Effort: Pulmonary effort is normal. No respiratory distress.     Breath sounds: Normal breath sounds. No wheezing or rales.  Chest:     Chest wall: No tenderness.  Musculoskeletal:     Cervical back: Normal range of motion and neck supple.  Neurological:     Mental Status: She is alert and oriented to person, place, and time.   Diabetic Foot Exam - Simple   Simple Foot Form Diabetic Foot exam was performed with the following findings: Yes  01/03/2021  2:28 PM  Visual Inspection No deformities, no ulcerations, no other skin breakdown bilaterally: Yes Sensation Testing Intact to touch and monofilament testing bilaterally: Yes Pulse Check Posterior Tibialis and Dorsalis pulse intact bilaterally: Yes Comments       BP 130/80 (BP Location: Left Arm, Patient Position: Sitting, Cuff Size: Small)    Pulse 62    Temp 98.1 F (36.7 C) (Oral)    Resp 16  Ht 5' (1.524 m)    Wt 114 lb (51.7 kg)    SpO2 99%    BMI 22.26 kg/m  Wt Readings from Last 3 Encounters:  01/03/21 114 lb (51.7 kg)  10/04/20 107 lb 6.4 oz (48.7 kg)  03/08/20 127 lb 6.4 oz (57.8 kg)     Health Maintenance Due  Topic Date Due   COLONOSCOPY (Pts 45-42yr Insurance coverage will need to be confirmed)  Never done   OPHTHALMOLOGY EXAM  01/28/2015   DEXA SCAN  Never done   Zoster Vaccines- Shingrix (2 of 2) 12/10/2017   MAMMOGRAM  04/04/2018    There are no preventive care reminders to display for this patient.  Lab Results  Component Value Date   TSH 1.05 05/26/2013   Lab Results  Component Value Date   WBC 7.1 10/04/2020   HGB 12.8 10/04/2020   HCT 37.6 10/04/2020   MCV 95.9 10/04/2020   PLT 226.0 10/04/2020   Lab Results  Component Value Date   NA 137 10/04/2020   K 4.6 10/04/2020   CO2 28 10/04/2020   GLUCOSE 150 (H) 10/04/2020   BUN 32 (H) 10/04/2020   CREATININE 1.44 (H) 10/04/2020   BILITOT 0.7 10/04/2020   ALKPHOS 31 (L) 10/04/2020   AST 25 10/04/2020   ALT 19 10/04/2020   PROT 7.4 10/04/2020   ALBUMIN 4.4 10/04/2020   CALCIUM 10.1 10/04/2020   GFR 37.24 (L) 10/04/2020   Lab Results  Component Value Date   CHOL 107 10/04/2020   Lab Results  Component Value Date   HDL 42.70 10/04/2020   Lab Results  Component Value Date   LDLCALC 40 10/04/2020   Lab Results  Component Value Date   TRIG 123.0 10/04/2020   Lab Results  Component Value Date   CHOLHDL 3 10/04/2020   Lab Results  Component Value Date   HGBA1C 5.9  10/04/2020      Assessment & Plan:   Problem List Items Addressed This Visit       Unprioritized   Hyperlipidemia LDL goal <70   Relevant Medications   amLODipine (NORVASC) 5 MG tablet   atorvastatin (LIPITOR) 80 MG tablet   chlorthalidone (HYGROTON) 25 MG tablet   fenofibrate (TRICOR) 48 MG tablet   metoprolol tartrate (LOPRESSOR) 25 MG tablet   nitroGLYCERIN (NITROSTAT) 0.4 MG SL tablet   olmesartan (BENICAR) 20 MG tablet   icosapent Ethyl (VASCEPA) 1 g capsule   Essential hypertension    Well controlled, no changes to meds. Encouraged heart healthy diet such as the DASH diet and exercise as tolerated.       Relevant Medications   amLODipine (NORVASC) 5 MG tablet   atorvastatin (LIPITOR) 80 MG tablet   chlorthalidone (HYGROTON) 25 MG tablet   fenofibrate (TRICOR) 48 MG tablet   metoprolol tartrate (LOPRESSOR) 25 MG tablet   nitroGLYCERIN (NITROSTAT) 0.4 MG SL tablet   olmesartan (BENICAR) 20 MG tablet   icosapent Ethyl (VASCEPA) 1 g capsule   Hyperlipidemia associated with type 2 diabetes mellitus (HCC)    Encourage heart healthy diet such as MIND or DASH diet, increase exercise, avoid trans fats, simple carbohydrates and processed foods, consider a krill or fish or flaxseed oil cap daily.       Relevant Medications   amLODipine (NORVASC) 5 MG tablet   atorvastatin (LIPITOR) 80 MG tablet   chlorthalidone (HYGROTON) 25 MG tablet   fenofibrate (TRICOR) 48 MG tablet   metoprolol tartrate (LOPRESSOR) 25 MG tablet  nitroGLYCERIN (NITROSTAT) 0.4 MG SL tablet   olmesartan (BENICAR) 20 MG tablet   Saxagliptin-Metformin 2.05-998 MG TB24   icosapent Ethyl (VASCEPA) 1 g capsule   Other Relevant Orders   Hemoglobin A1c   Comprehensive metabolic panel   Lipid panel   Uncontrolled type 2 diabetes mellitus with hyperglycemia (HCC)    hgba1c to be checked, minimize simple carbs. Increase exercise as tolerated. Continue current meds       Relevant Medications   atorvastatin  (LIPITOR) 80 MG tablet   olmesartan (BENICAR) 20 MG tablet   Saxagliptin-Metformin 2.05-998 MG TB24   Other Visit Diagnoses     Diabetes mellitus type II, non insulin dependent (HCC)    -  Primary   Relevant Medications   atorvastatin (LIPITOR) 80 MG tablet   olmesartan (BENICAR) 20 MG tablet   Saxagliptin-Metformin 2.05-998 MG TB24   Other Relevant Orders   Hemoglobin A1c   Comprehensive metabolic panel   Lipid panel   Primary hypertension       Relevant Medications   amLODipine (NORVASC) 5 MG tablet   atorvastatin (LIPITOR) 80 MG tablet   chlorthalidone (HYGROTON) 25 MG tablet   fenofibrate (TRICOR) 48 MG tablet   metoprolol tartrate (LOPRESSOR) 25 MG tablet   nitroGLYCERIN (NITROSTAT) 0.4 MG SL tablet   olmesartan (BENICAR) 20 MG tablet   icosapent Ethyl (VASCEPA) 1 g capsule   Other Relevant Orders   Hemoglobin A1c   Comprehensive metabolic panel   Lipid panel   Estrogen deficiency       Relevant Orders   DG Bone Density   Screening mammogram for breast cancer       Relevant Orders   MM DIGITAL SCREENING BILATERAL   Hyperlipidemia, unspecified hyperlipidemia type       Relevant Medications   amLODipine (NORVASC) 5 MG tablet   atorvastatin (LIPITOR) 80 MG tablet   chlorthalidone (HYGROTON) 25 MG tablet   fenofibrate (TRICOR) 48 MG tablet   metoprolol tartrate (LOPRESSOR) 25 MG tablet   nitroGLYCERIN (NITROSTAT) 0.4 MG SL tablet   olmesartan (BENICAR) 20 MG tablet   icosapent Ethyl (VASCEPA) 1 g capsule   Primary osteoarthritis involving multiple joints       Relevant Medications   meloxicam (MOBIC) 15 MG tablet   Coronary artery disease involving coronary bypass graft of native heart with unstable angina pectoris (HCC)       Relevant Medications   amLODipine (NORVASC) 5 MG tablet   atorvastatin (LIPITOR) 80 MG tablet   chlorthalidone (HYGROTON) 25 MG tablet   fenofibrate (TRICOR) 48 MG tablet   metoprolol tartrate (LOPRESSOR) 25 MG tablet   nitroGLYCERIN  (NITROSTAT) 0.4 MG SL tablet   olmesartan (BENICAR) 20 MG tablet   icosapent Ethyl (VASCEPA) 1 g capsule   Controlled type 2 diabetes mellitus without complication, without long-term current use of insulin (HCC)       Relevant Medications   atorvastatin (LIPITOR) 80 MG tablet   olmesartan (BENICAR) 20 MG tablet   Saxagliptin-Metformin 2.05-998 MG TB24   Insomnia, unspecified type       Relevant Medications   traZODone (DESYREL) 50 MG tablet   Vitamin D deficiency       Relevant Medications   Vitamin D, Ergocalciferol, (DRISDOL) 1.25 MG (50000 UNIT) CAPS capsule   Colon cancer screening       Relevant Orders   Cologuard       Meds ordered this encounter  Medications   amLODipine (NORVASC) 5 MG tablet  Sig: Take 1 tablet (5 mg total) by mouth daily.    Dispense:  90 tablet    Refill:  1   atorvastatin (LIPITOR) 80 MG tablet    Sig: Take 1 tablet (80 mg total) by mouth daily.    Dispense:  90 tablet    Refill:  1   chlorthalidone (HYGROTON) 25 MG tablet    Sig: Take 1 tablet (25 mg total) by mouth daily. Please have patient to call for virtual visit for follow up    Dispense:  90 tablet    Refill:  1   fenofibrate (TRICOR) 48 MG tablet    Sig: Take 1 tablet (48 mg total) by mouth daily.    Dispense:  90 tablet    Refill:  1   meloxicam (MOBIC) 15 MG tablet    Sig: Take 1 tablet (15 mg total) by mouth daily as needed for pain.    Dispense:  90 tablet    Refill:  0    [place] LABEL IF AVAILABLE   metoprolol tartrate (LOPRESSOR) 25 MG tablet    Sig: Take 1 tablet (25 mg total) by mouth 2 (two) times daily.    Dispense:  180 tablet    Refill:  1   nitroGLYCERIN (NITROSTAT) 0.4 MG SL tablet    Sig: Place 1 tablet (0.4 mg total) under the tongue every 5 (five) minutes as needed for chest pain.    Dispense:  30 tablet    Refill:  0   olmesartan (BENICAR) 20 MG tablet    Sig: Take 1 tablet (20 mg total) by mouth daily.    Dispense:  90 tablet    Refill:  1    Saxagliptin-Metformin 2.05-998 MG TB24    Sig: Take 2 tablets by mouth daily.    Dispense:  180 tablet    Refill:  1   traZODone (DESYREL) 50 MG tablet    Sig: Take 0.5-1 tablets (25-50 mg total) by mouth at bedtime as needed for sleep.    Dispense:  90 tablet    Refill:  1   icosapent Ethyl (VASCEPA) 1 g capsule    Sig: Take 2 capsules (2 g total) by mouth 2 (two) times daily.    Dispense:  120 capsule    Refill:  2   Vitamin D, Ergocalciferol, (DRISDOL) 1.25 MG (50000 UNIT) CAPS capsule    Sig: Take 1 capsule (50,000 Units total) by mouth every 7 (seven) days.    Dispense:  12 capsule    Refill:  3    Follow-up: Return in about 6 months (around 07/04/2021), or if symptoms worsen or fail to improve, for annual exam, fasting.    [name], DO

## 2021-01-03 NOTE — Assessment & Plan Note (Signed)
[id number] to be checked, minimize simple carbs. Increase exercise as tolerated. Continue current meds  

## 2021-01-03 NOTE — Telephone Encounter (Signed)
Needs PA 

## 2021-01-03 NOTE — Assessment & Plan Note (Signed)
Encourage heart healthy diet such as MIND or DASH diet, increase exercise, avoid trans fats, simple carbohydrates and processed foods, consider a krill or fish or flaxseed oil cap daily.  °

## 2021-01-03 NOTE — Assessment & Plan Note (Signed)
Well controlled, no changes to meds. Encouraged heart healthy diet such as the DASH diet and exercise as tolerated.  °

## 2021-01-05 ENCOUNTER — Telehealth (HOSPITAL_BASED_OUTPATIENT_CLINIC_OR_DEPARTMENT_OTHER): Payer: Self-pay

## 2021-01-05 NOTE — Telephone Encounter (Signed)
Alternative Requested:NOT COVERED.  

## 2021-04-05 ENCOUNTER — Other Ambulatory Visit: Payer: Self-pay | Admitting: Family Medicine

## 2021-04-05 DIAGNOSIS — M159 Polyosteoarthritis, unspecified: Secondary | ICD-10-CM

## 2021-04-10 ENCOUNTER — Other Ambulatory Visit: Payer: Self-pay | Admitting: Family Medicine

## 2021-04-10 DIAGNOSIS — E785 Hyperlipidemia, unspecified: Secondary | ICD-10-CM

## 2021-07-09 ENCOUNTER — Other Ambulatory Visit: Payer: Self-pay | Admitting: Family Medicine

## 2021-07-09 DIAGNOSIS — I10 Essential (primary) hypertension: Secondary | ICD-10-CM

## 2021-07-09 DIAGNOSIS — G4700 Insomnia, unspecified: Secondary | ICD-10-CM

## 2021-07-10 ENCOUNTER — Other Ambulatory Visit: Payer: Self-pay | Admitting: Family Medicine

## 2021-07-10 DIAGNOSIS — M159 Polyosteoarthritis, unspecified: Secondary | ICD-10-CM

## 2021-07-18 ENCOUNTER — Encounter: Payer: Medicare HMO | Admitting: Family Medicine

## 2021-07-21 ENCOUNTER — Encounter: Payer: Self-pay | Admitting: Family Medicine

## 2021-07-21 ENCOUNTER — Encounter: Payer: Medicare HMO | Admitting: Family Medicine

## 2021-07-21 ENCOUNTER — Other Ambulatory Visit: Payer: Self-pay | Admitting: Family Medicine

## 2021-07-21 ENCOUNTER — Ambulatory Visit (INDEPENDENT_AMBULATORY_CARE_PROVIDER_SITE_OTHER): Payer: Medicare HMO | Admitting: Family Medicine

## 2021-07-21 VITALS — BP 110/72 | HR 51 | Temp 98.4°F | Resp 18 | Ht 60.0 in | Wt 118.4 lb

## 2021-07-21 DIAGNOSIS — Z1211 Encounter for screening for malignant neoplasm of colon: Secondary | ICD-10-CM

## 2021-07-21 DIAGNOSIS — M159 Polyosteoarthritis, unspecified: Secondary | ICD-10-CM

## 2021-07-21 DIAGNOSIS — I10 Essential (primary) hypertension: Secondary | ICD-10-CM | POA: Diagnosis not present

## 2021-07-21 DIAGNOSIS — E785 Hyperlipidemia, unspecified: Secondary | ICD-10-CM | POA: Diagnosis not present

## 2021-07-21 DIAGNOSIS — E119 Type 2 diabetes mellitus without complications: Secondary | ICD-10-CM | POA: Diagnosis not present

## 2021-07-21 DIAGNOSIS — G4700 Insomnia, unspecified: Secondary | ICD-10-CM | POA: Diagnosis not present

## 2021-07-21 LAB — LIPID PANEL
Cholesterol: 137 mg/dL (ref 0–200)
HDL: 50.1 mg/dL (ref >39.00)
LDL Cholesterol: 56 mg/dL (ref 0–99)
NonHDL: 86.84
Total CHOL/HDL Ratio: 3
Triglycerides: 156 mg/dL — ABNORMAL HIGH (ref 0.0–149.0)
VLDL: 31.2 mg/dL (ref 0.0–40.0)

## 2021-07-21 LAB — HEMOGLOBIN A1C: Hgb A1c MFr Bld: 6.5 % (ref 4.6–6.5)

## 2021-07-21 LAB — CBC WITH DIFFERENTIAL/PLATELET
Basophils Absolute: 0 10*3/uL (ref 0.0–0.1)
Basophils Relative: 0.6 % (ref 0.0–3.0)
Eosinophils Absolute: 0.5 10*3/uL (ref 0.0–0.7)
Eosinophils Relative: 6 % — ABNORMAL HIGH (ref 0.0–5.0)
HCT: 38.4 % (ref 36.0–46.0)
Hemoglobin: 12.7 g/dL (ref 12.0–15.0)
Lymphocytes Relative: 42.3 % (ref 12.0–46.0)
Lymphs Abs: 3.6 10*3/uL (ref 0.7–4.0)
MCHC: 33.1 g/dL (ref 30.0–36.0)
MCV: 95.7 fl (ref 78.0–100.0)
Monocytes Absolute: 0.7 10*3/uL (ref 0.1–1.0)
Monocytes Relative: 8.1 % (ref 3.0–12.0)
Neutro Abs: 3.7 10*3/uL (ref 1.4–7.7)
Neutrophils Relative %: 43 % (ref 43.0–77.0)
Platelets: 283 10*3/uL (ref 150.0–400.0)
RBC: 4.01 Mil/uL (ref 3.87–5.11)
RDW: 13 % (ref 11.5–15.5)
WBC: 8.6 10*3/uL (ref 4.0–10.5)

## 2021-07-21 LAB — COMPREHENSIVE METABOLIC PANEL
ALT: 18 U/L (ref 0–35)
AST: 23 U/L (ref 0–37)
Albumin: 4.8 g/dL (ref 3.5–5.2)
Alkaline Phosphatase: 42 U/L (ref 39–117)
BUN: 36 mg/dL — ABNORMAL HIGH (ref 6–23)
CO2: 25 mEq/L (ref 19–32)
Calcium: 10.1 mg/dL (ref 8.4–10.5)
Chloride: 103 mEq/L (ref 96–112)
Creatinine, Ser: 1.74 mg/dL — ABNORMAL HIGH (ref 0.40–1.20)
GFR: 29.51 mL/min — ABNORMAL LOW (ref >60.00)
Glucose, Bld: 50 mg/dL — ABNORMAL LOW (ref 70–99)
Potassium: 3.8 mEq/L (ref 3.5–5.1)
Sodium: 138 mEq/L (ref 135–145)
Total Bilirubin: 0.6 mg/dL (ref 0.2–1.2)
Total Protein: 8.1 g/dL (ref 6.0–8.3)

## 2021-07-21 LAB — MICROALBUMIN / CREATININE URINE RATIO
Creatinine,U: 127.7 mg/dL
Microalb Creat Ratio: 1.2 mg/g (ref 0.0–30.0)
Microalb, Ur: 1.6 mg/dL (ref 0.0–1.9)

## 2021-07-21 MED ORDER — METOPROLOL TARTRATE 25 MG PO TABS: 25.0000 mg | ORAL_TABLET | Freq: Two times a day (BID) | ORAL | 1 refills | Status: DC

## 2021-07-21 MED ORDER — FENOFIBRATE 48 MG PO TABS: 48.0000 mg | ORAL_TABLET | Freq: Every day | ORAL | 1 refills | Status: DC

## 2021-07-21 MED ORDER — TRAZODONE HCL 50 MG PO TABS: ORAL_TABLET | ORAL | 0 refills | Status: DC

## 2021-07-21 MED ORDER — MELOXICAM 15 MG PO TABS: ORAL_TABLET | ORAL | 0 refills | Status: DC

## 2021-07-21 MED ORDER — ICOSAPENT ETHYL 1 G PO CAPS: ORAL_CAPSULE | ORAL | 2 refills | Status: DC

## 2021-07-21 MED ORDER — AMLODIPINE BESYLATE 5 MG PO TABS: 5.0000 mg | ORAL_TABLET | Freq: Every day | ORAL | 1 refills | Status: DC

## 2021-07-21 MED ORDER — CHLORTHALIDONE 25 MG PO TABS: 25.0000 mg | ORAL_TABLET | Freq: Every day | ORAL | 1 refills | Status: DC

## 2021-07-21 MED ORDER — OLMESARTAN MEDOXOMIL 20 MG PO TABS: 20.0000 mg | ORAL_TABLET | Freq: Every day | ORAL | 1 refills | Status: DC

## 2021-07-21 MED ORDER — SAXAGLIPTIN-METFORMIN ER 2.5-1000 MG PO TB24: 2.0000 | ORAL_TABLET | Freq: Every day | ORAL | 1 refills | Status: DC

## 2021-07-21 MED ORDER — ATORVASTATIN CALCIUM 80 MG PO TABS: 80.0000 mg | ORAL_TABLET | Freq: Every day | ORAL | 1 refills | Status: DC

## 2021-07-21 NOTE — Patient Instructions (Signed)
Preventive Care 65 Years and Older, Female Preventive care refers to lifestyle choices and visits with your health care provider that can promote health and wellness. Preventive care visits are also called wellness exams. What can I expect for my preventive care visit? Counseling Your health care provider may ask you questions about your: Medical history, including: Past medical problems. Family medical history. Pregnancy and menstrual history. History of falls. Current health, including: Memory and ability to understand (cognition). Emotional well-being. Home life and relationship well-being. Sexual activity and sexual health. Lifestyle, including: Alcohol, nicotine or tobacco, and drug use. Access to firearms. Diet, exercise, and sleep habits. Work and work environment. Sunscreen use. Safety issues such as seatbelt and bike helmet use. Physical exam Your health care provider will check your: Height and weight. These may be used to calculate your BMI (body mass index). BMI is a measurement that tells if you are at a healthy weight. Waist circumference. This measures the distance around your waistline. This measurement also tells if you are at a healthy weight and may help predict your risk of certain diseases, such as type 2 diabetes and high blood pressure. Heart rate and blood pressure. Body temperature. Skin for abnormal spots. What immunizations do I need?  Vaccines are usually given at various ages, according to a schedule. Your health care provider will recommend vaccines for you based on your age, medical history, and lifestyle or other factors, such as travel or where you work. What tests do I need? Screening Your health care provider may recommend screening tests for certain conditions. This may include: Lipid and cholesterol levels. Hepatitis C test. Hepatitis B test. HIV (human immunodeficiency virus) test. STI (sexually transmitted infection) testing, if you are at  risk. Lung cancer screening. Colorectal cancer screening. Diabetes screening. This is done by checking your blood sugar (glucose) after you have not eaten for a while (fasting). Mammogram. Talk with your health care provider about how often you should have regular mammograms. BRCA-related cancer screening. This may be done if you have a family history of breast, ovarian, tubal, or peritoneal cancers. Bone density scan. This is done to screen for osteoporosis. Talk with your health care provider about your test results, treatment options, and if necessary, the need for more tests. Follow these instructions at home: Eating and drinking  Eat a diet that includes fresh fruits and vegetables, whole grains, lean protein, and low-fat dairy products. Limit your intake of foods with high amounts of sugar, saturated fats, and salt. Take vitamin and mineral supplements as recommended by your health care provider. Do not drink alcohol if your health care provider tells you not to drink. If you drink alcohol: Limit how much you have to 0-1 drink a day. Know how much alcohol is in your drink. In the [place] one drink equals one 12 oz bottle of beer (355 mL), one 5 oz glass of wine (148 mL), or one 1 oz glass of hard liquor (44 mL). Lifestyle Brush your teeth every morning and night with fluoride toothpaste. Floss one time each day. Exercise for at least 30 minutes 5 or more days each week. Do not use any products that contain nicotine or tobacco. These products include cigarettes, chewing tobacco, and vaping devices, such as e-cigarettes. If you need help quitting, ask your health care provider. Do not use drugs. If you are sexually active, practice safe sex. Use a condom or other form of protection in order to prevent STIs. Take aspirin only as told by   your health care provider. Make sure that you understand how much to take and what form to take. Work with your health care provider to find out whether it  is safe and beneficial for you to take aspirin daily. Ask your health care provider if you need to take a cholesterol-lowering medicine (statin). Find healthy ways to manage stress, such as: Meditation, yoga, or listening to music. Journaling. Talking to a trusted person. Spending time with friends and family. Minimize exposure to UV radiation to reduce your risk of skin cancer. Safety Always wear your seat belt while driving or riding in a vehicle. Do not drive: If you have been drinking alcohol. Do not ride with someone who has been drinking. When you are tired or distracted. While texting. If you have been using any mind-altering substances or drugs. Wear a helmet and other protective equipment during sports activities. If you have firearms in your house, make sure you follow all gun safety procedures. What's next? Visit your health care provider once a year for an annual wellness visit. Ask your health care provider how often you should have your eyes and teeth checked. Stay up to date on all vaccines. This information is not intended to replace advice given to you by your health care provider. Make sure you discuss any questions you have with your health care provider. Document Revised: 06/30/2020 Document Reviewed: 06/30/2020 Elsevier Patient Education  2023 [employer]  

## 2021-07-21 NOTE — Progress Notes (Signed)
 Subjective:   By signing my name below, I, Mohammed Iqbal, attest that this documentation has been prepared under the direction and in the presence of Lowne Chase, Yvonne R, DO  07/21/2021   Patient ID: Dominique Hayes, female    DOB: 07/29/1950, 70 y.o.   MRN: [id number]  No chief complaint on file.   HPI Patient is in today for a comprehensive physical exam. Her husband is speaking on her behalf to help with translation. She denies having any fever, new moles, congestion, sinus pain, sore throat, chest pain, palpitations, cough, shortness of breath, wheezing, nausea, vomiting, diarrhea, constipation, dysuria, frequency, abdominal pain, hematuria, new muscle pain, new joint pain, headaches. She is requesting refills on all her medications. She does not eat meat nor fish but does eat eggs. Her husband reports no changes in family history. She is due for a colonoscopy and has not contacted a GI specialist as of so far. She has an appointment scheduled next month with her ophthalmologist. Her husband reports that she has been going to the dentist regularly.   Past Medical History:  Diagnosis Date   DM (diabetes mellitus) (HCC)    Hepatitis B    Hyperlipidemia    Hypertension    Mild diastolic dysfunction     Past Surgical History:  Procedure Laterality Date   ABDOMINAL HYSTERECTOMY     TAH-- complications from D &C    Family History  Problem Relation Age of Onset   Hypertension Mother    Hypertension Father    Hypertension Sister    Hypertension Sister     Social History   Socioeconomic History   Marital status: Married    Spouse name: Not on file   Number of children: 1   Years of education: Not on file   Highest education level: Not on file  Occupational History   Occupation: nail tech  Tobacco Use   Smoking status: Never   Smokeless tobacco: Never  Substance and Sexual Activity   Alcohol use: No    Alcohol/week: 0.0 standard drinks of alcohol   Drug use: No    Sexual activity: Yes  Other Topics Concern   Not on file  Social History Narrative   Marital status: married      Employment: employed; p/t nail salon   Exercise--- treadmill everday for 30 min   Social Determinants of Health   Financial Resource Strain: Not on file  Food Insecurity: Not on file  Transportation Needs: Not on file  Physical Activity: Not on file  Stress: Not on file  Social Connections: Not on file  Intimate Partner Violence: Not on file    Outpatient Medications Prior to Visit  Medication Sig Dispense Refill   amLODipine (NORVASC) 5 MG tablet Take 1 tablet (5 mg total) by mouth daily. 90 tablet 1   atorvastatin (LIPITOR) 80 MG tablet TAKE 1 TABLET BY MOUTH EVERY DAY 90 tablet 0   blood glucose meter kit and supplies KIT Dispense based on patient and insurance preference. Use to test sugar once a day.  E11.51 1 each 0   chlorthalidone (HYGROTON) 25 MG tablet TAKE 1 TABLET (25 MG TOTAL) BY MOUTH DAILY. PLEASE HAVE PATIENT TO CALL FOR VIRTUAL VISIT FOR FOLLOW UP 90 tablet 0   [facility] ASPIRIN LOW DOSE 81 MG EC tablet TAKE 1 TABLET BY MOUTH DAILY. 30 tablet 3   fenofibrate (TRICOR) 48 MG tablet Take 1 tablet (48 mg total) by mouth daily. 90 tablet 1     glucose blood test strip Use to test sugar once a day.  E11.51 100 each 1   Lancets MISC Use to test sugar once a day.  E11.51 100 each 1   loratadine (CLARITIN) 10 MG tablet TAKE 1 TABLET BY MOUTH EVERY DAY 90 tablet 3   MEGARED OMEGA-3 KRILL OIL 500 MG CAPS Take 1 capsule by mouth daily. 30 capsule 2   meloxicam (MOBIC) 15 MG tablet TAKE 1 TABLET BY MOUTH EVERY DAY AS NEEDED FOR PAIN 30 tablet 0   metoprolol tartrate (LOPRESSOR) 25 MG tablet Take 1 tablet (25 mg total) by mouth 2 (two) times daily. 180 tablet 1   mometasone (ELOCON) 0.1 % cream APPLY TOPICALLY TO AFFECTED AREA EVERY DAY 135 g 0   nitroGLYCERIN (NITROSTAT) 0.4 MG SL tablet Place 1 tablet (0.4 mg total) under the tongue every 5 (five) minutes as needed for  chest pain. 30 tablet 0   nystatin cream (MYCOSTATIN) Apply 1 application topically 2 (two) times daily. 30 g 0   olmesartan (BENICAR) 20 MG tablet TAKE 1 TABLET BY MOUTH EVERY DAY 90 tablet 1   olmesartan (BENICAR) 20 MG tablet Take 1 tablet (20 mg total) by mouth daily. 90 tablet 1   Saxagliptin-Metformin 2.05-998 MG TB24 Take 2 tablets by mouth daily. 180 tablet 1   traZODone (DESYREL) 50 MG tablet TAKE 0.5-1 TABLETS BY MOUTH AT BEDTIME AS NEEDED FOR SLEEP. 90 tablet 0   valACYclovir (VALTREX) 1000 MG tablet Take 1 tablet (1,000 mg total) by mouth 3 (three) times daily. 21 tablet 2   VASCEPA 1 g capsule TAKE 2 CAPSULES BY MOUTH 2 TIMES DAILY. 120 capsule 2   Vitamin D, Ergocalciferol, (DRISDOL) 1.25 MG (50000 UNIT) CAPS capsule Take 1 capsule (50,000 Units total) by mouth every 7 (seven) days. 12 capsule 3   No facility-administered medications prior to visit.    No Known Allergies  Review of Systems  Constitutional:  Negative for chills and fever.  HENT:  Negative for congestion, sinus pain and sore throat.   Respiratory:  Negative for cough, shortness of breath and wheezing.   Cardiovascular:  Negative for chest pain, palpitations and leg swelling.  Gastrointestinal:  Negative for abdominal pain, constipation, diarrhea, nausea and vomiting.  Genitourinary:  Negative for dysuria, frequency and hematuria.  Musculoskeletal:  Negative for joint pain and myalgias.  Skin:        (-)new moles  Neurological:  Negative for headaches.       Objective:    Physical Exam Constitutional:      Appearance: Normal appearance. She is not ill-appearing.  HENT:     Head: Normocephalic and atraumatic.     Right Ear: External ear normal.     Left Ear: External ear normal.  Eyes:     Extraocular Movements: Extraocular movements intact.     Pupils: Pupils are equal, round, and reactive to light.  Cardiovascular:     Rate and Rhythm: Normal rate and regular rhythm.     Pulses: Normal pulses.      Heart sounds: Normal heart sounds. No murmur heard.    No gallop.  Pulmonary:     Effort: Pulmonary effort is normal. No respiratory distress.     Breath sounds: Normal breath sounds. No wheezing.  Abdominal:     General: Bowel sounds are normal.     Palpations: Abdomen is soft.     Tenderness: There is no abdominal tenderness. There is no guarding.  Skin:    General:   Skin is dry.  Neurological:     Mental Status: She is alert and oriented to person, place, and time.  Psychiatric:        Judgment: Judgment normal.     There were no vitals taken for this visit. Wt Readings from Last 3 Encounters:  01/03/21 114 lb (51.7 kg)  10/04/20 107 lb 6.4 oz (48.7 kg)  03/08/20 127 lb 6.4 oz (57.8 kg)    Diabetic Foot Exam - Simple   No data filed    Lab Results  Component Value Date   WBC 7.1 10/04/2020   HGB 12.8 10/04/2020   HCT 37.6 10/04/2020   PLT 226.0 10/04/2020   GLUCOSE 255 (H) 01/03/2021   CHOL 162 01/03/2021   TRIG 231.0 (H) 01/03/2021   HDL 37.30 (L) 01/03/2021   LDLDIRECT 97.0 01/03/2021   LDLCALC 40 10/04/2020   ALT 17 01/03/2021   AST 21 01/03/2021   NA 137 01/03/2021   K 4.5 01/03/2021   CL 101 01/03/2021   CREATININE 1.19 01/03/2021   BUN 18 01/03/2021   CO2 27 01/03/2021   TSH 1.05 05/26/2013   HGBA1C 6.1 01/03/2021   MICROALBUR <0.7 10/04/2020    Lab Results  Component Value Date   TSH 1.05 05/26/2013   Lab Results  Component Value Date   WBC 7.1 10/04/2020   HGB 12.8 10/04/2020   HCT 37.6 10/04/2020   MCV 95.9 10/04/2020   PLT 226.0 10/04/2020   Lab Results  Component Value Date   NA 137 01/03/2021   K 4.5 01/03/2021   CO2 27 01/03/2021   GLUCOSE 255 (H) 01/03/2021   BUN 18 01/03/2021   CREATININE 1.19 01/03/2021   BILITOT 0.7 01/03/2021   ALKPHOS 35 (L) 01/03/2021   AST 21 01/03/2021   ALT 17 01/03/2021   PROT 7.2 01/03/2021   ALBUMIN 4.2 01/03/2021   CALCIUM 9.4 01/03/2021   GFR 46.74 (L) 01/03/2021   Lab Results   Component Value Date   CHOL 162 01/03/2021   Lab Results  Component Value Date   HDL 37.30 (L) 01/03/2021   Lab Results  Component Value Date   LDLCALC 40 10/04/2020   Lab Results  Component Value Date   TRIG 231.0 (H) 01/03/2021   Lab Results  Component Value Date   CHOLHDL 4 01/03/2021   Lab Results  Component Value Date   HGBA1C 6.1 01/03/2021   Mammogram- Last completed on 04/02/2016. No mammographic evidence of malignancy. A result letter of this screening mammogram will be mailed directly to the patient. Repeat in one year. Colonoscopy- Due     Assessment & Plan:   Problem List Items Addressed This Visit   None  No orders of the defined types were placed in this encounter.   I, Kellie Simmering, personally preformed the services described in this documentation.  All medical record entries made by the scribe were at my direction and in my presence.  I have reviewed the chart and discharge instructions (if applicable) and agree that the record reflects my personal performance and is accurate and complete. 07/21/2021  I,Mohammed Iqbal,acting as a scribe for [name], DO.,have documented all relevant documentation on the behalf of [name], DO,as directed by  [name], DO while in the presence of [name], DO.    Mohammed [place]

## 2021-07-21 NOTE — Progress Notes (Signed)
Subjective:     Dominique Hayes is a 71 y.o. female and is here for a comprehensive physical exam. The patient reports no problems.  Social History   Socioeconomic History   Marital status: Married    Spouse name: Not on file   Number of children: 1   Years of education: Not on file   Highest education level: Not on file  Occupational History   Occupation: nail tech  Tobacco Use   Smoking status: Never   Smokeless tobacco: Never  Substance and Sexual Activity   Alcohol use: No    Alcohol/week: 0.0 standard drinks of alcohol   Drug use: No   Sexual activity: Yes  Other Topics Concern   Not on file  Social History Narrative   Marital status: married      Employment: employed; p/t Company secretary   Exercise--- treadmill everday for 30 min   Social Determinants of Health   Financial Resource Strain: Not on file  Food Insecurity: Not on file  Transportation Needs: Not on file  Physical Activity: Not on file  Stress: Not on file  Social Connections: Not on file  Intimate Partner Violence: Not on file   Health Maintenance  Topic Date Due   COLONOSCOPY (Pts 45-81yr Insurance coverage will need to be confirmed)  Never done   OPHTHALMOLOGY EXAM  01/28/2015   DEXA SCAN  Never done   Zoster Vaccines- Shingrix (2 of 2) 12/10/2017   MAMMOGRAM  04/04/2018   COVID-19 Vaccine (5 - Pfizer series) 04/12/2021   HEMOGLOBIN A1C  07/04/2021   INFLUENZA VACCINE  08/16/2021   FOOT EXAM  01/03/2022   TETANUS/TDAP  11/11/2022   Pneumonia Vaccine 70 Years old  Completed   Hepatitis C Screening  Completed   HPV VACCINES  Aged Out    The following portions of the patient's history were reviewed and updated as appropriate: She  has a past medical history of DM (diabetes mellitus) ([place], Hepatitis B, Hyperlipidemia, Hypertension, and Mild diastolic dysfunction. She does not have any pertinent problems on file. She  has a past surgical history that includes Abdominal hysterectomy. Her family  history includes Hypertension in her father, mother, sister, and sister. She  reports that she has never smoked. She has never used smokeless tobacco. She reports that she does not drink alcohol and does not use drugs. She has a current medication list which includes the following prescription(s): amlodipine, atorvastatin, blood glucose meter kit and supplies, chlorthalidone, cvs aspirin low dose, fenofibrate, glucose blood, icosapent ethyl, lancets, loratadine, megared omega-3 krill oil, meloxicam, metoprolol tartrate, mometasone, nitroglycerin, nystatin cream, olmesartan, saxagliptin-metformin, trazodone, valacyclovir, and vitamin d (ergocalciferol). Current Outpatient Medications on File Prior to Visit  Medication Sig Dispense Refill   blood glucose meter kit and supplies KIT Dispense based on patient and insurance preference. Use to test sugar once a day.  E11.51 1 each 0   CVS ASPIRIN LOW DOSE 81 MG EC tablet TAKE 1 TABLET BY MOUTH DAILY. 30 tablet 3   glucose blood test strip Use to test sugar once a day.  E11.51 100 each 1   Lancets MISC Use to test sugar once a day.  E11.51 100 each 1   loratadine (CLARITIN) 10 MG tablet TAKE 1 TABLET BY MOUTH EVERY DAY 90 tablet 3   MEGARED OMEGA-3 KRILL OIL 500 MG CAPS Take 1 capsule by mouth daily. 30 capsule 2   mometasone (ELOCON) 0.1 % cream APPLY TOPICALLY TO AFFECTED AREA EVERY DAY 135 g  0   nitroGLYCERIN (NITROSTAT) 0.4 MG SL tablet Place 1 tablet (0.4 mg total) under the tongue every 5 (five) minutes as needed for chest pain. 30 tablet 0   nystatin cream (MYCOSTATIN) Apply 1 application topically 2 (two) times daily. 30 g 0   valACYclovir (VALTREX) 1000 MG tablet Take 1 tablet (1,000 mg total) by mouth 3 (three) times daily. 21 tablet 2   Vitamin D, Ergocalciferol, (DRISDOL) 1.25 MG (50000 UNIT) CAPS capsule Take 1 capsule (50,000 Units total) by mouth every 7 (seven) days. 12 capsule 3   No current facility-administered medications on file prior  to visit.   She has No Known Allergies..  Review of Systems Review of Systems  Constitutional: Negative for activity change, appetite change and fatigue.  HENT: Negative for hearing loss, congestion, tinnitus and ear discharge.  dentist q86mEyes: Negative for visual disturbance (see optho q1y -- vision corrected to 20/20 with glasses).  Respiratory: Negative for cough, chest tightness and shortness of breath.   Cardiovascular: Negative for chest pain, palpitations and leg swelling.  Gastrointestinal: Negative for abdominal pain, diarrhea, constipation and abdominal distention.  Genitourinary: Negative for urgency, frequency, decreased urine volume and difficulty urinating.  Musculoskeletal: Negative for back pain, arthralgias and gait problem.  Skin: Negative for color change, pallor and rash.  Neurological: Negative for dizziness, light-headedness, numbness and headaches.  Hematological: Negative for adenopathy. Does not bruise/bleed easily.  Psychiatric/Behavioral: Negative for suicidal ideas, confusion, sleep disturbance, self-injury, dysphoric mood, decreased concentration and agitation.      Objective:    BP 110/72 (BP Location: Left Arm, Patient Position: Sitting, Cuff Size: Normal)   Pulse (!) 51   Temp 98.4 F (36.9 C) (Oral)   Resp 18   Ht 5' (1.524 m)   Wt 118 lb 6.4 oz (53.7 kg)   SpO2 98%   BMI 23.12 kg/m  General appearance: alert, cooperative, appears stated age, and no distress Head: Normocephalic, without obvious abnormality, atraumatic Eyes: conjunctivae/corneas clear. PERRL, EOM's intact. Fundi benign. Ears: normal TM's and external ear canals both ears Nose: Nares normal. Septum midline. Mucosa normal. No drainage or sinus tenderness. Throat: lips, mucosa, and tongue normal; teeth and gums normal Neck: no adenopathy, no carotid bruit, no JVD, supple, symmetrical, trachea midline, and thyroid not enlarged, symmetric, no tenderness/mass/nodules Back:  symmetric, no curvature. ROM normal. No CVA tenderness. Lungs: clear to auscultation bilaterally Heart: regular rate and rhythm, S1, S2 normal, no murmur, click, rub or gallop Abdomen: soft, non-tender; bowel sounds normal; no masses,  no organomegaly Extremities: extremities normal, atraumatic, no cyanosis or edema Pulses: 2+ and symmetric Skin: Skin color, texture, turgor normal. No rashes or lesions Lymph nodes: Cervical, supraclavicular, and axillary nodes normal. Neurologic: Alert and oriented X 3, normal strength and tone. Normal symmetric reflexes. Normal coordination and gait    Assessment:    Healthy female exam.      Plan:    Ghm utd Check labs  See After Visit Summary for Counseling Recommendations   1. Primary hypertension Well controlled, no changes to meds. Encouraged heart healthy diet such as the DASH diet and exercise as tolerated.   - amLODipine (NORVASC) 5 MG tablet; Take 1 tablet (5 mg total) by mouth daily.  Dispense: 90 tablet; Refill: 1 - olmesartan (BENICAR) 20 MG tablet; Take 1 tablet (20 mg total) by mouth daily.  Dispense: 90 tablet; Refill: 1  2. Essential hypertension   - chlorthalidone (HYGROTON) 25 MG tablet; Take 1 tablet (25 mg total) by  mouth daily.  Dispense: 90 tablet; Refill: 1 - metoprolol tartrate (LOPRESSOR) 25 MG tablet; Take 1 tablet (25 mg total) by mouth 2 (two) times daily.  Dispense: 180 tablet; Refill: 1 - CBC with Differential/Platelet - Comprehensive metabolic panel - Hemoglobin A1c - Lipid panel - Microalbumin / creatinine urine ratio  3. Hyperlipidemia, unspecified hyperlipidemia type \s - fenofibrate (TRICOR) 48 MG tablet; Take 1 tablet (48 mg total) by mouth daily.  Dispense: 90 tablet; Refill: 1  4. Primary osteoarthritis involving multiple joints stable - meloxicam (MOBIC) 15 MG tablet; TAKE 1 TABLET BY MOUTH EVERY DAY AS NEEDED FOR PAIN  Dispense: 30 tablet; Refill: 0  5. Controlled type 2 diabetes mellitus without  complication, without long-term current use of insulin ([place]) hgba1c to be checked  minimize simple carbs. Increase exercise as tolerated. Continue current meds  - Saxagliptin-Metformin 2.05-998 MG TB24; Take 2 tablets by mouth daily.  Dispense: 180 tablet; Refill: 1 - CBC with Differential/Platelet - Comprehensive metabolic panel - Hemoglobin A1c - Lipid panel - Microalbumin / creatinine urine ratio  6. Insomnia, unspecified type   - traZODone (DESYREL) 50 MG tablet; TAKE 0.5-1 TABLETS BY MOUTH AT BEDTIME AS NEEDED FOR SLEEP.  Dispense: 90 tablet; Refill: 0  7. Hyperlipidemia LDL goal <70 Encourage heart healthy diet such as MIND or DASH diet, increase exercise, avoid trans fats, simple carbohydrates and processed foods, consider a krill or fish or flaxseed oil cap daily.   - atorvastatin (LIPITOR) 80 MG tablet; Take 1 tablet (80 mg total) by mouth daily.  Dispense: 90 tablet; Refill: 1 - icosapent Ethyl (VASCEPA) 1 g capsule; TAKE 2 CAPSULES BY MOUTH 2 TIMES DAILY.  Dispense: 120 capsule; Refill: 2 - CBC with Differential/Platelet - Comprehensive metabolic panel - Hemoglobin A1c - Lipid panel - Microalbumin / creatinine urine ratio  8. Colon cancer screening   - Ambulatory referral to Gastroenterology

## 2021-07-27 ENCOUNTER — Telehealth: Payer: Self-pay | Admitting: *Deleted

## 2021-07-27 NOTE — Telephone Encounter (Signed)
Prior auth started via cover my meds.  Awaiting determination.  Key: [id number]

## 2021-07-29 NOTE — Telephone Encounter (Addendum)
No answer/ no vm  Rx sent in

## 2021-07-29 NOTE — Telephone Encounter (Signed)
Insurance denied. They will cover Jentadueto XR, Trijardy XR, or Xigduo XR.  If you do not want to change I can try and submit and appeal.  Please advise.

## 2021-07-29 NOTE — Telephone Encounter (Signed)
Your request was denied We have denied coverage or payment under your Medicare Part D benefit for the following prescription drug(s) that you or your prescriber requested: KOMBIGLYZE XR Tablet ER 24HR Why did we deny your request? We denied this request under Medicare Part D because: The requested drug is not on your plan's formulary (list of covered drugs). Your Medicare Part D drug plan was asked to cover a drug that is not on the formulary (this is called a formulary exception). Your prescriber did not provide the detailed information that is required in order to approve the request. To receive a formulary exception, per the Part D Coverage Determination guidance Section 40.5.2 and 40.5.3, your prescriber must provide information that documents at least one of the following has occurred: - You have tried the formulary drugs for the treatment of your condition and they did not work for you. OR - The formulary drugs could cause adverse effects. OR - The formulary drugs would be less effective for your condition than the requested drug. Talk to your prescriber to see if the following covered alternative(s) would be right for you: Form [id number] OMB Approval No. [id number] (Expires 07/16/2022) Jentadueto XR (Different quantity limits apply depending on the strength of the medication prescribed. Please consult the Medicare Part D plan's formulary for the specific quantity limit.) Trijardy XR tablet (Different quantity limits apply depending on the strength of the medication prescribed. Please consult the Medicare Part D plan's formulary for the specific quantity limit.) Xigduo XR (Different quantity limits apply depending on the strength of the medication prescribed. Please consult the Medicare Part D plan's formulary for the specific quantity limit.)

## 2021-08-01 NOTE — Telephone Encounter (Signed)
No answer no vm

## 2021-08-02 ENCOUNTER — Encounter: Payer: Self-pay | Admitting: *Deleted

## 2021-09-05 DIAGNOSIS — H40033 Anatomical narrow angle, bilateral: Secondary | ICD-10-CM | POA: Diagnosis not present

## 2021-09-05 DIAGNOSIS — Z0100 Encounter for examination of eyes and vision without abnormal findings: Secondary | ICD-10-CM | POA: Diagnosis not present

## 2021-09-05 DIAGNOSIS — E109 Type 1 diabetes mellitus without complications: Secondary | ICD-10-CM | POA: Diagnosis not present

## 2021-09-05 DIAGNOSIS — H524 Presbyopia: Secondary | ICD-10-CM | POA: Diagnosis not present

## 2021-09-09 ENCOUNTER — Other Ambulatory Visit: Payer: Self-pay | Admitting: Family Medicine

## 2021-09-09 DIAGNOSIS — M159 Polyosteoarthritis, unspecified: Secondary | ICD-10-CM

## 2021-09-26 DIAGNOSIS — H2511 Age-related nuclear cataract, right eye: Secondary | ICD-10-CM | POA: Diagnosis not present

## 2021-09-26 DIAGNOSIS — H40033 Anatomical narrow angle, bilateral: Secondary | ICD-10-CM | POA: Diagnosis not present

## 2021-09-26 DIAGNOSIS — H2512 Age-related nuclear cataract, left eye: Secondary | ICD-10-CM | POA: Diagnosis not present

## 2021-10-11 ENCOUNTER — Other Ambulatory Visit: Payer: Self-pay | Admitting: Family Medicine

## 2021-10-11 DIAGNOSIS — M159 Polyosteoarthritis, unspecified: Secondary | ICD-10-CM

## 2021-12-12 ENCOUNTER — Encounter: Payer: Self-pay | Admitting: Family Medicine

## 2021-12-28 DIAGNOSIS — H25811 Combined forms of age-related cataract, right eye: Secondary | ICD-10-CM | POA: Diagnosis not present

## 2021-12-28 DIAGNOSIS — H40033 Anatomical narrow angle, bilateral: Secondary | ICD-10-CM | POA: Diagnosis not present

## 2021-12-28 DIAGNOSIS — H25812 Combined forms of age-related cataract, left eye: Secondary | ICD-10-CM | POA: Diagnosis not present

## 2022-01-06 ENCOUNTER — Other Ambulatory Visit: Payer: Self-pay | Admitting: Family Medicine

## 2022-01-06 DIAGNOSIS — G4700 Insomnia, unspecified: Secondary | ICD-10-CM

## 2022-03-29 ENCOUNTER — Other Ambulatory Visit: Payer: Self-pay | Admitting: Family Medicine

## 2022-03-29 DIAGNOSIS — E785 Hyperlipidemia, unspecified: Secondary | ICD-10-CM

## 2022-03-30 ENCOUNTER — Encounter: Payer: Self-pay | Admitting: Family Medicine

## 2022-03-30 ENCOUNTER — Other Ambulatory Visit: Payer: Self-pay | Admitting: Family Medicine

## 2022-03-30 ENCOUNTER — Ambulatory Visit (INDEPENDENT_AMBULATORY_CARE_PROVIDER_SITE_OTHER): Payer: 59 | Admitting: Family Medicine

## 2022-03-30 ENCOUNTER — Ambulatory Visit: Payer: 59 | Admitting: Family Medicine

## 2022-03-30 VITALS — BP 118/78 | HR 62 | Temp 98.5°F | Resp 16 | Ht 60.0 in | Wt 115.0 lb

## 2022-03-30 DIAGNOSIS — E1169 Type 2 diabetes mellitus with other specified complication: Secondary | ICD-10-CM | POA: Diagnosis not present

## 2022-03-30 DIAGNOSIS — E785 Hyperlipidemia, unspecified: Secondary | ICD-10-CM

## 2022-03-30 DIAGNOSIS — E1165 Type 2 diabetes mellitus with hyperglycemia: Secondary | ICD-10-CM | POA: Diagnosis not present

## 2022-03-30 DIAGNOSIS — E119 Type 2 diabetes mellitus without complications: Secondary | ICD-10-CM

## 2022-03-30 DIAGNOSIS — I10 Essential (primary) hypertension: Secondary | ICD-10-CM

## 2022-03-30 DIAGNOSIS — Z1211 Encounter for screening for malignant neoplasm of colon: Secondary | ICD-10-CM | POA: Diagnosis not present

## 2022-03-30 LAB — COMPREHENSIVE METABOLIC PANEL
ALT: 25 U/L (ref 0–35)
AST: 35 U/L (ref 0–37)
Albumin: 4.3 g/dL (ref 3.5–5.2)
Alkaline Phosphatase: 32 U/L — ABNORMAL LOW (ref 39–117)
BUN: 33 mg/dL — ABNORMAL HIGH (ref 6–23)
CO2: 26 mEq/L (ref 19–32)
Calcium: 9.8 mg/dL (ref 8.4–10.5)
Chloride: 99 mEq/L (ref 96–112)
Creatinine, Ser: 2 mg/dL — ABNORMAL HIGH (ref 0.40–1.20)
GFR: 24.85 mL/min — ABNORMAL LOW (ref >60.00)
Glucose, Bld: 93 mg/dL (ref 70–99)
Potassium: 4.3 mEq/L (ref 3.5–5.1)
Sodium: 136 mEq/L (ref 135–145)
Total Bilirubin: 0.6 mg/dL (ref 0.2–1.2)
Total Protein: 7.7 g/dL (ref 6.0–8.3)

## 2022-03-30 LAB — CBC WITH DIFFERENTIAL/PLATELET
Basophils Absolute: 0 10*3/uL (ref 0.0–0.1)
Basophils Relative: 0.9 % (ref 0.0–3.0)
Eosinophils Absolute: 0.4 10*3/uL (ref 0.0–0.7)
Eosinophils Relative: 6.8 % — ABNORMAL HIGH (ref 0.0–5.0)
HCT: 37.2 % (ref 36.0–46.0)
Hemoglobin: 12.5 g/dL (ref 12.0–15.0)
Lymphocytes Relative: 29.5 % (ref 12.0–46.0)
Lymphs Abs: 1.7 10*3/uL (ref 0.7–4.0)
MCHC: 33.6 g/dL (ref 30.0–36.0)
MCV: 94.9 fl (ref 78.0–100.0)
Monocytes Absolute: 0.5 10*3/uL (ref 0.1–1.0)
Monocytes Relative: 8.5 % (ref 3.0–12.0)
Neutro Abs: 3.1 10*3/uL (ref 1.4–7.7)
Neutrophils Relative %: 54.3 % (ref 43.0–77.0)
Platelets: 342 10*3/uL (ref 150.0–400.0)
RBC: 3.91 Mil/uL (ref 3.87–5.11)
RDW: 13.5 % (ref 11.5–15.5)
WBC: 5.6 10*3/uL (ref 4.0–10.5)

## 2022-03-30 LAB — LIPID PANEL
Cholesterol: 199 mg/dL (ref 0–200)
HDL: 41.2 mg/dL (ref >39.00)
NonHDL: 158.06
Total CHOL/HDL Ratio: 5
Triglycerides: 209 mg/dL — ABNORMAL HIGH (ref 0.0–149.0)
VLDL: 41.8 mg/dL — ABNORMAL HIGH (ref 0.0–40.0)

## 2022-03-30 LAB — HEMOGLOBIN A1C: Hgb A1c MFr Bld: 6.1 % (ref 4.6–6.5)

## 2022-03-30 LAB — MICROALBUMIN / CREATININE URINE RATIO
Creatinine,U: 138.4 mg/dL
Microalb Creat Ratio: 0.9 mg/g (ref 0.0–30.0)
Microalb, Ur: 1.3 mg/dL (ref 0.0–1.9)

## 2022-03-30 LAB — LDL CHOLESTEROL, DIRECT: Direct LDL: 137 mg/dL

## 2022-03-30 MED ORDER — AMLODIPINE BESYLATE 5 MG PO TABS: 5.0000 mg | ORAL_TABLET | Freq: Every day | ORAL | 1 refills | Status: DC

## 2022-03-30 MED ORDER — ATORVASTATIN CALCIUM 80 MG PO TABS: 80.0000 mg | ORAL_TABLET | Freq: Every day | ORAL | 1 refills | Status: DC

## 2022-03-30 MED ORDER — METOPROLOL TARTRATE 25 MG PO TABS: 25.0000 mg | ORAL_TABLET | Freq: Two times a day (BID) | ORAL | 1 refills | Status: DC

## 2022-03-30 MED ORDER — OLMESARTAN MEDOXOMIL 20 MG PO TABS: 20.0000 mg | ORAL_TABLET | Freq: Every day | ORAL | 1 refills | Status: DC

## 2022-03-30 MED ORDER — JENTADUETO XR 5-1000 MG PO TB24: 1.0000 | ORAL_TABLET | Freq: Every day | ORAL | 0 refills | Status: DC

## 2022-03-30 MED ORDER — FENOFIBRATE 48 MG PO TABS: 48.0000 mg | ORAL_TABLET | Freq: Every day | ORAL | 1 refills | Status: DC

## 2022-03-30 MED ORDER — CHLORTHALIDONE 25 MG PO TABS: 25.0000 mg | ORAL_TABLET | Freq: Every day | ORAL | 1 refills | Status: DC

## 2022-03-30 MED ORDER — ICOSAPENT ETHYL 1 G PO CAPS: ORAL_CAPSULE | ORAL | 2 refills | Status: DC

## 2022-03-30 NOTE — Assessment & Plan Note (Signed)
hgba1c to be checked minimize simple carbs. Increase exercise as tolerated. Continue current meds 

## 2022-03-30 NOTE — Patient Instructions (Signed)

## 2022-03-30 NOTE — Assessment & Plan Note (Signed)
Well controlled, no changes to meds. Encouraged heart healthy diet such as the DASH diet and exercise as tolerated.  °

## 2022-03-30 NOTE — Progress Notes (Signed)
Subjective:   By signing my name below, I, Dominique Hayes, attest that this documentation has been prepared under the direction and in the presence of Dominique Held, DO 03/30/22   Patient ID: Dominique Hayes, female    DOB: Jul 31, 1950, 72 y.o.   MRN: [id number]  Chief Complaint  Patient presents with   Hypertension   Hyperlipidemia   Diabetes   Follow-up    HPI Patient is in today for a follow up. She is accompanied by her husband who is helping with interpretation.   She has been checking her blood sugar at home 65-75. Her husband states she has been feeling overall well.   She has been following up with the ophthalmologist every 4 months for cataracts in both eyes. Her husband notes that she has not had a surgery for cataracts yet, but they are considering getting it within the next year.   She has not had a colonoscopy. She is receptive to do a cologuard.   Past Medical History:  Diagnosis Date   DM (diabetes mellitus) ([place])    Hepatitis B    Hyperlipidemia    Hypertension    Mild diastolic dysfunction     Past Surgical History:  Procedure Laterality Date   ABDOMINAL HYSTERECTOMY     TAH-- complications from D &C    Family History  Problem Relation Age of Onset   Hypertension Mother    Hypertension Father    Hypertension Sister    Hypertension Sister     Social History   Socioeconomic History   Marital status: Married    Spouse name: Not on file   Number of children: 1   Years of education: Not on file   Highest education level: Not on file  Occupational History   Occupation: nail tech  Tobacco Use   Smoking status: Never   Smokeless tobacco: Never  Substance and Sexual Activity   Alcohol use: No    Alcohol/week: 0.0 standard drinks of alcohol   Drug use: No   Sexual activity: Yes  Other Topics Concern   Not on file  Social History Narrative   Marital status: married      Employment: employed; p/t Company secretary   Exercise--- treadmill everday  for 30 min   Social Determinants of Radio broadcast assistant Strain: Not on file  Food Insecurity: Not on file  Transportation Needs: Not on file  Physical Activity: Not on file  Stress: Not on file  Social Connections: Not on file  Intimate Partner Violence: Not on file    Outpatient Medications Prior to Visit  Medication Sig Dispense Refill   blood glucose meter kit and supplies KIT Dispense based on patient and insurance preference. Use to test sugar once a day.  E11.51 1 each 0   [facility] ASPIRIN LOW DOSE 81 MG EC tablet TAKE 1 TABLET BY MOUTH DAILY. 30 tablet 3   glucose blood test strip Use to test sugar once a day.  E11.51 100 each 1   Lancets MISC Use to test sugar once a day.  E11.51 100 each 1   loratadine (CLARITIN) 10 MG tablet TAKE 1 TABLET BY MOUTH EVERY DAY 90 tablet 3   MEGARED OMEGA-3 KRILL OIL 500 MG CAPS Take 1 capsule by mouth daily. 30 capsule 2   meloxicam (MOBIC) 15 MG tablet Take 1 tablet (15 mg total) by mouth daily as needed for pain. 30 tablet 0   mometasone (ELOCON) 0.1 % cream APPLY  TOPICALLY TO AFFECTED AREA EVERY DAY 135 g 0   nitroGLYCERIN (NITROSTAT) 0.4 MG SL tablet Place 1 tablet (0.4 mg total) under the tongue every 5 (five) minutes as needed for chest pain. 30 tablet 0   nystatin cream (MYCOSTATIN) Apply 1 application topically 2 (two) times daily. 30 g 0   traZODone (DESYREL) 50 MG tablet TAKE 1/2 TO 1 TABLET BY MOUTH AT BEDTIME AS NEEDED FOR SLEEP 90 tablet 0   valACYclovir (VALTREX) 1000 MG tablet Take 1 tablet (1,000 mg total) by mouth 3 (three) times daily. 21 tablet 2   Vitamin D, Ergocalciferol, (DRISDOL) 1.25 MG (50000 UNIT) CAPS capsule Take 1 capsule (50,000 Units total) by mouth every 7 (seven) days. 12 capsule 3   amLODipine (NORVASC) 5 MG tablet Take 1 tablet (5 mg total) by mouth daily. 90 tablet 1   atorvastatin (LIPITOR) 80 MG tablet TAKE 1 TABLET BY MOUTH EVERY DAY 90 tablet 1   chlorthalidone (HYGROTON) 25 MG tablet Take 1 tablet (25  mg total) by mouth daily. 90 tablet 1   fenofibrate (TRICOR) 48 MG tablet Take 1 tablet (48 mg total) by mouth daily. 90 tablet 1   icosapent Ethyl (VASCEPA) 1 g capsule TAKE 2 CAPSULES BY MOUTH 2 TIMES DAILY. 120 capsule 2   linaGLIPtin-metFORMIN HCl ER (JENTADUETO XR) 05-998 MG TB24 Take 1 tablet by mouth daily. 90 tablet 0   metoprolol tartrate (LOPRESSOR) 25 MG tablet Take 1 tablet (25 mg total) by mouth 2 (two) times daily. 180 tablet 1   olmesartan (BENICAR) 20 MG tablet Take 1 tablet (20 mg total) by mouth daily. 90 tablet 1   No facility-administered medications prior to visit.    No Known Allergies  Review of Systems  Constitutional:  Negative for fever and malaise/fatigue.  HENT:  Negative for congestion.   Eyes:  Negative for blurred vision.  Respiratory:  Negative for cough and shortness of breath.   Cardiovascular:  Negative for chest pain, palpitations and leg swelling.  Gastrointestinal:  Negative for vomiting.  Musculoskeletal:  Negative for back pain.  Skin:  Negative for rash.  Neurological:  Negative for loss of consciousness and headaches.       Objective:    Physical Exam Vitals and nursing note reviewed.  Constitutional:      Appearance: She is well-developed.  HENT:     Head: Normocephalic and atraumatic.  Eyes:     Conjunctiva/sclera: Conjunctivae normal.  Neck:     Thyroid: No thyromegaly.     Vascular: No carotid bruit or JVD.  Cardiovascular:     Rate and Rhythm: Normal rate and regular rhythm.     Heart sounds: Normal heart sounds. No murmur heard. Pulmonary:     Effort: Pulmonary effort is normal. No respiratory distress.     Breath sounds: Normal breath sounds. No wheezing or rales.  Chest:     Chest wall: No tenderness.  Musculoskeletal:     Cervical back: Normal range of motion and neck supple.  Neurological:     General: No focal deficit present.     Mental Status: She is alert and oriented to person, place, and time.    Diabetic  Foot Exam - Simple   Simple Foot Form Diabetic Foot exam was performed with the following findings: Yes 03/30/2022 11:09 AM  Visual Inspection No deformities, no ulcerations, no other skin breakdown bilaterally: Yes Sensation Testing Intact to touch and monofilament testing bilaterally: Yes Pulse Check Posterior Tibialis and Dorsalis pulse intact  bilaterally: Yes Comments      BP 118/78 (BP Location: Left Arm, Patient Position: Sitting, Cuff Size: Normal)   Pulse 62   Temp 98.5 F (36.9 C) (Oral)   Resp 16   Ht 5' (1.524 m)   Wt 115 lb (52.2 kg)   SpO2 99%   BMI 22.46 kg/m  Wt Readings from Last 3 Encounters:  03/30/22 115 lb (52.2 kg)  07/21/21 118 lb 6.4 oz (53.7 kg)  01/03/21 114 lb (51.7 kg)       Assessment & Plan:  Primary hypertension -     CBC with Differential/Platelet -     Comprehensive metabolic panel -     amLODIPine Besylate; Take 1 tablet (5 mg total) by mouth daily.  Dispense: 90 tablet; Refill: 1 -     Olmesartan Medoxomil; Take 1 tablet (20 mg total) by mouth daily.  Dispense: 90 tablet; Refill: 1  Hyperlipidemia, unspecified hyperlipidemia type -     Lipid panel -     Comprehensive metabolic panel -     Fenofibrate; Take 1 tablet (48 mg total) by mouth daily.  Dispense: 90 tablet; Refill: 1  Diabetes mellitus type II, non insulin dependent (HCC) -     Comprehensive metabolic panel -     Hemoglobin A1c -     Microalbumin / creatinine urine ratio  Hyperlipidemia LDL goal <70 -     Atorvastatin Calcium; Take 1 tablet (80 mg total) by mouth daily.  Dispense: 90 tablet; Refill: 1 -     Icosapent Ethyl; TAKE 2 CAPSULES BY MOUTH 2 TIMES DAILY.  Dispense: 120 capsule; Refill: 2  Essential hypertension Assessment & Plan: Well controlled, no changes to meds. Encouraged heart healthy diet such as the DASH diet and exercise as tolerated.    Orders: -     Chlorthalidone; Take 1 tablet (25 mg total) by mouth daily.  Dispense: 90 tablet; Refill: 1 -      Metoprolol Tartrate; Take 1 tablet (25 mg total) by mouth 2 (two) times daily.  Dispense: 180 tablet; Refill: 1  Controlled type 2 diabetes mellitus without complication, without long-term current use of insulin (HCC) -     Jentadueto XR; Take 1 tablet by mouth daily.  Dispense: 90 tablet; Refill: 0  Colon cancer screening -     Cologuard  Hyperlipidemia associated with type 2 diabetes mellitus ([place]) Assessment & Plan: Encourage heart healthy diet such as MIND or DASH diet, increase exercise, avoid trans fats, simple carbohydrates and processed foods, consider a krill or fish or flaxseed oil cap daily.     Uncontrolled type 2 diabetes mellitus with hyperglycemia (HCC) Assessment & Plan: hgba1c to be checked  minimize simple carbs. Increase exercise as tolerated. Continue current meds       I,Rachel Rivera,acting as a scribe for Dominique Held, DO.,have documented all relevant documentation on the behalf of Dominique Held, DO,as directed by  Dominique Held, DO while in the presence of Dominique Held, DO.   I, Dominique Held, DO, personally preformed the services described in this documentation.  All medical record entries made by the scribe were at my direction and in my presence.  I have reviewed the chart and discharge instructions (if applicable) and agree that the record reflects my personal performance and is accurate and complete. 03/30/22   Dominique Held, DO

## 2022-03-30 NOTE — Assessment & Plan Note (Signed)
Encourage heart healthy diet such as MIND or DASH diet, increase exercise, avoid trans fats, simple carbohydrates and processed foods, consider a krill or fish or flaxseed oil cap daily.  °

## 2022-04-03 ENCOUNTER — Other Ambulatory Visit: Payer: Self-pay | Admitting: Family Medicine

## 2022-04-03 DIAGNOSIS — G4700 Insomnia, unspecified: Secondary | ICD-10-CM

## 2022-04-05 ENCOUNTER — Other Ambulatory Visit: Payer: Self-pay | Admitting: *Deleted

## 2022-04-05 DIAGNOSIS — R7989 Other specified abnormal findings of blood chemistry: Secondary | ICD-10-CM

## 2022-04-05 DIAGNOSIS — R799 Abnormal finding of blood chemistry, unspecified: Secondary | ICD-10-CM

## 2022-04-05 DIAGNOSIS — Z1211 Encounter for screening for malignant neoplasm of colon: Secondary | ICD-10-CM | POA: Diagnosis not present

## 2022-04-11 LAB — COLOGUARD: COLOGUARD: NEGATIVE

## 2022-06-27 ENCOUNTER — Encounter: Payer: Self-pay | Admitting: *Deleted

## 2022-06-28 DIAGNOSIS — H25812 Combined forms of age-related cataract, left eye: Secondary | ICD-10-CM | POA: Diagnosis not present

## 2022-06-28 DIAGNOSIS — H25811 Combined forms of age-related cataract, right eye: Secondary | ICD-10-CM | POA: Diagnosis not present

## 2022-07-07 ENCOUNTER — Other Ambulatory Visit (INDEPENDENT_AMBULATORY_CARE_PROVIDER_SITE_OTHER): Payer: 59

## 2022-07-07 ENCOUNTER — Telehealth: Payer: Self-pay | Admitting: Family Medicine

## 2022-07-07 DIAGNOSIS — E785 Hyperlipidemia, unspecified: Secondary | ICD-10-CM | POA: Diagnosis not present

## 2022-07-07 DIAGNOSIS — Z794 Long term (current) use of insulin: Secondary | ICD-10-CM | POA: Diagnosis not present

## 2022-07-07 DIAGNOSIS — I10 Essential (primary) hypertension: Secondary | ICD-10-CM

## 2022-07-07 DIAGNOSIS — R7989 Other specified abnormal findings of blood chemistry: Secondary | ICD-10-CM

## 2022-07-07 DIAGNOSIS — R799 Abnormal finding of blood chemistry, unspecified: Secondary | ICD-10-CM

## 2022-07-07 DIAGNOSIS — E119 Type 2 diabetes mellitus without complications: Secondary | ICD-10-CM | POA: Diagnosis not present

## 2022-07-07 LAB — CBC WITH DIFFERENTIAL/PLATELET
Basophils Absolute: 0 10*3/uL (ref 0.0–0.1)
Basophils Relative: 0.7 % (ref 0.0–3.0)
Eosinophils Absolute: 0.2 10*3/uL (ref 0.0–0.7)
Eosinophils Relative: 3 % (ref 0.0–5.0)
HCT: 36 % (ref 36.0–46.0)
Hemoglobin: 11.7 g/dL — ABNORMAL LOW (ref 12.0–15.0)
Lymphocytes Relative: 32.8 % (ref 12.0–46.0)
Lymphs Abs: 1.8 10*3/uL (ref 0.7–4.0)
MCHC: 32.5 g/dL (ref 30.0–36.0)
MCV: 96.6 fl (ref 78.0–100.0)
Monocytes Absolute: 0.4 10*3/uL (ref 0.1–1.0)
Monocytes Relative: 8 % (ref 3.0–12.0)
Neutro Abs: 3 10*3/uL (ref 1.4–7.7)
Neutrophils Relative %: 55.5 % (ref 43.0–77.0)
Platelets: 303 10*3/uL (ref 150.0–400.0)
RBC: 3.72 Mil/uL — ABNORMAL LOW (ref 3.87–5.11)
RDW: 14 % (ref 11.5–15.5)
WBC: 5.4 10*3/uL (ref 4.0–10.5)

## 2022-07-07 LAB — MICROALBUMIN / CREATININE URINE RATIO
Creatinine,U: 101.6 mg/dL
Microalb Creat Ratio: 3.7 mg/g (ref 0.0–30.0)
Microalb, Ur: 3.7 mg/dL — ABNORMAL HIGH (ref 0.0–1.9)

## 2022-07-07 LAB — COMPREHENSIVE METABOLIC PANEL
ALT: 86 U/L — ABNORMAL HIGH (ref 0–35)
AST: 82 U/L — ABNORMAL HIGH (ref 0–37)
Albumin: 3.9 g/dL (ref 3.5–5.2)
Alkaline Phosphatase: 41 U/L (ref 39–117)
BUN: 21 mg/dL (ref 6–23)
CO2: 25 mEq/L (ref 19–32)
Calcium: 9.2 mg/dL (ref 8.4–10.5)
Chloride: 105 mEq/L (ref 96–112)
Creatinine, Ser: 1.8 mg/dL — ABNORMAL HIGH (ref 0.40–1.20)
GFR: 28.15 mL/min — ABNORMAL LOW (ref >60.00)
Glucose, Bld: 72 mg/dL (ref 70–99)
Potassium: 4.4 mEq/L (ref 3.5–5.1)
Sodium: 138 mEq/L (ref 135–145)
Total Bilirubin: 0.6 mg/dL (ref 0.2–1.2)
Total Protein: 7.6 g/dL (ref 6.0–8.3)

## 2022-07-07 LAB — HEMOGLOBIN A1C: Hgb A1c MFr Bld: 6.6 % — ABNORMAL HIGH (ref 4.6–6.5)

## 2022-07-07 LAB — LIPID PANEL
Cholesterol: 133 mg/dL (ref 0–200)
HDL: 33.5 mg/dL — ABNORMAL LOW (ref >39.00)
LDL Cholesterol: 67 mg/dL (ref 0–99)
NonHDL: 99.39
Total CHOL/HDL Ratio: 4
Triglycerides: 160 mg/dL — ABNORMAL HIGH (ref 0.0–149.0)
VLDL: 32 mg/dL (ref 0.0–40.0)

## 2022-07-07 NOTE — Telephone Encounter (Signed)
New referral placed.

## 2022-07-07 NOTE — Telephone Encounter (Signed)
Pt's spouse came in office stating pt had a referral for [facility] but the referral was closed due to pt was in [place] for 2 month. Pt would like to have the referral resent to [facility] so they can get an appt with them ASAP. Please advise.

## 2022-07-16 ENCOUNTER — Other Ambulatory Visit: Payer: Self-pay | Admitting: Family Medicine

## 2022-07-16 DIAGNOSIS — R748 Abnormal levels of other serum enzymes: Secondary | ICD-10-CM

## 2022-07-16 DIAGNOSIS — E119 Type 2 diabetes mellitus without complications: Secondary | ICD-10-CM

## 2022-07-16 DIAGNOSIS — I10 Essential (primary) hypertension: Secondary | ICD-10-CM

## 2022-07-16 DIAGNOSIS — E785 Hyperlipidemia, unspecified: Secondary | ICD-10-CM

## 2022-09-09 ENCOUNTER — Inpatient Hospital Stay (HOSPITAL_COMMUNITY)
Admission: EM | Admit: 2022-09-09 | Discharge: 2022-09-11 | DRG: 638 | Disposition: A | Discharge status: Home / Self Care | Payer: 59 | Attending: Internal Medicine | Admitting: Internal Medicine

## 2022-09-09 ENCOUNTER — Emergency Department (HOSPITAL_COMMUNITY): Payer: 59

## 2022-09-09 ENCOUNTER — Other Ambulatory Visit: Payer: Self-pay

## 2022-09-09 ENCOUNTER — Encounter (HOSPITAL_COMMUNITY): Payer: Self-pay | Admitting: Family Medicine

## 2022-09-09 DIAGNOSIS — E11649 Type 2 diabetes mellitus with hypoglycemia without coma: Secondary | ICD-10-CM | POA: Diagnosis not present

## 2022-09-09 DIAGNOSIS — F32A Depression, unspecified: Secondary | ICD-10-CM | POA: Diagnosis present

## 2022-09-09 DIAGNOSIS — Z7982 Long term (current) use of aspirin: Secondary | ICD-10-CM

## 2022-09-09 DIAGNOSIS — I10 Essential (primary) hypertension: Secondary | ICD-10-CM | POA: Diagnosis present

## 2022-09-09 DIAGNOSIS — E162 Hypoglycemia, unspecified: Principal | ICD-10-CM

## 2022-09-09 DIAGNOSIS — I700 Atherosclerosis of aorta: Secondary | ICD-10-CM | POA: Diagnosis not present

## 2022-09-09 DIAGNOSIS — N390 Urinary tract infection, site not specified: Secondary | ICD-10-CM | POA: Diagnosis not present

## 2022-09-09 DIAGNOSIS — Z8249 Family history of ischemic heart disease and other diseases of the circulatory system: Secondary | ICD-10-CM

## 2022-09-09 DIAGNOSIS — Z90710 Acquired absence of both cervix and uterus: Secondary | ICD-10-CM

## 2022-09-09 DIAGNOSIS — K8020 Calculus of gallbladder without cholecystitis without obstruction: Secondary | ICD-10-CM

## 2022-09-09 DIAGNOSIS — E161 Other hypoglycemia: Secondary | ICD-10-CM | POA: Diagnosis not present

## 2022-09-09 DIAGNOSIS — E785 Hyperlipidemia, unspecified: Secondary | ICD-10-CM | POA: Diagnosis present

## 2022-09-09 DIAGNOSIS — G4700 Insomnia, unspecified: Secondary | ICD-10-CM | POA: Diagnosis not present

## 2022-09-09 DIAGNOSIS — I471 Supraventricular tachycardia, unspecified: Secondary | ICD-10-CM | POA: Diagnosis present

## 2022-09-09 DIAGNOSIS — R748 Abnormal levels of other serum enzymes: Secondary | ICD-10-CM | POA: Diagnosis not present

## 2022-09-09 DIAGNOSIS — F419 Anxiety disorder, unspecified: Secondary | ICD-10-CM | POA: Diagnosis not present

## 2022-09-09 DIAGNOSIS — Z79899 Other long term (current) drug therapy: Secondary | ICD-10-CM | POA: Diagnosis not present

## 2022-09-09 DIAGNOSIS — Z743 Need for continuous supervision: Secondary | ICD-10-CM | POA: Diagnosis not present

## 2022-09-09 DIAGNOSIS — R109 Unspecified abdominal pain: Secondary | ICD-10-CM | POA: Diagnosis not present

## 2022-09-09 LAB — CBC WITH DIFFERENTIAL/PLATELET
Abs Immature Granulocytes: 0.05 10*3/uL (ref 0.00–0.07)
Basophils Absolute: 0 10*3/uL (ref 0.0–0.1)
Basophils Relative: 0 %
Eosinophils Absolute: 0 10*3/uL (ref 0.0–0.5)
Eosinophils Relative: 0 %
HCT: 33.2 % — ABNORMAL LOW (ref 36.0–46.0)
Hemoglobin: 11.2 g/dL — ABNORMAL LOW (ref 12.0–15.0)
Immature Granulocytes: 1 %
Lymphocytes Relative: 18 %
Lymphs Abs: 1.2 10*3/uL (ref 0.7–4.0)
MCH: 32.3 pg (ref 26.0–34.0)
MCHC: 33.7 g/dL (ref 30.0–36.0)
MCV: 95.7 fL (ref 80.0–100.0)
Monocytes Absolute: 0.4 10*3/uL (ref 0.1–1.0)
Monocytes Relative: 6 %
Neutro Abs: 4.8 10*3/uL (ref 1.7–7.7)
Neutrophils Relative %: 75 %
Platelets: 252 10*3/uL (ref 150–400)
RBC: 3.47 MIL/uL — ABNORMAL LOW (ref 3.87–5.11)
RDW: 12.5 % (ref 11.5–15.5)
WBC: 6.4 10*3/uL (ref 4.0–10.5)
nRBC: 0 % (ref 0.0–0.2)

## 2022-09-09 LAB — CBG MONITORING, ED
Glucose-Capillary: 48 mg/dL — ABNORMAL LOW (ref 70–99)
Glucose-Capillary: 222 mg/dL — ABNORMAL HIGH (ref 70–99)
Glucose-Capillary: 191 mg/dL — ABNORMAL HIGH (ref 70–99)
Glucose-Capillary: 96 mg/dL (ref 70–99)
Glucose-Capillary: 82 mg/dL (ref 70–99)
Glucose-Capillary: 120 mg/dL — ABNORMAL HIGH (ref 70–99)

## 2022-09-09 LAB — COMPREHENSIVE METABOLIC PANEL
ALT: 21 U/L (ref 0–44)
AST: 33 U/L (ref 15–41)
Albumin: 4.2 g/dL (ref 3.5–5.0)
Alkaline Phosphatase: 34 U/L — ABNORMAL LOW (ref 38–126)
Anion gap: 11 (ref 5–15)
BUN: 34 mg/dL — ABNORMAL HIGH (ref 8–23)
CO2: 22 mmol/L (ref 22–32)
Calcium: 9.5 mg/dL (ref 8.9–10.3)
Chloride: 101 mmol/L (ref 98–111)
Creatinine, Ser: 1.42 mg/dL — ABNORMAL HIGH (ref 0.44–1.00)
GFR, Estimated: 40 mL/min — ABNORMAL LOW (ref >60)
Glucose, Bld: 60 mg/dL — ABNORMAL LOW (ref 70–99)
Potassium: 4 mmol/L (ref 3.5–5.1)
Sodium: 134 mmol/L — ABNORMAL LOW (ref 135–145)
Total Bilirubin: 0.5 mg/dL (ref 0.3–1.2)
Total Protein: 7.7 g/dL (ref 6.5–8.1)

## 2022-09-09 LAB — URINALYSIS, ROUTINE W REFLEX MICROSCOPIC
Bilirubin Urine: NEGATIVE
Glucose, UA: NEGATIVE mg/dL
Hgb urine dipstick: NEGATIVE
Ketones, ur: NEGATIVE mg/dL
Nitrite: NEGATIVE
Protein, ur: NEGATIVE mg/dL
Specific Gravity, Urine: 1.01 (ref 1.005–1.030)
pH: 5 (ref 5.0–8.0)

## 2022-09-09 LAB — LIPASE, BLOOD: Lipase: 92 U/L — ABNORMAL HIGH (ref 11–51)

## 2022-09-09 LAB — GLUCOSE, CAPILLARY: Glucose-Capillary: 125 mg/dL — ABNORMAL HIGH (ref 70–99)

## 2022-09-09 MED ORDER — INSULIN ASPART 100 UNIT/ML IJ SOLN
0.0000 [IU] | INTRAMUSCULAR | Status: DC
  Administered 2022-09-09 – 2022-09-10 (×3): 2 [IU] via SUBCUTANEOUS
  Filled 2022-09-09: qty 0.15

## 2022-09-09 MED ORDER — DEXTROSE-SODIUM CHLORIDE 5-0.9 % IV SOLN: INTRAVENOUS | Status: DC

## 2022-09-09 MED ORDER — SODIUM CHLORIDE 0.9 % IV SOLN: INTRAVENOUS | Status: DC

## 2022-09-09 MED ORDER — SODIUM CHLORIDE 0.9 % IV BOLUS: 500.0000 mL | Freq: Once | INTRAVENOUS | Status: DC

## 2022-09-09 MED ORDER — ACETAMINOPHEN 325 MG PO TABS: 650.0000 mg | ORAL_TABLET | Freq: Four times a day (QID) | ORAL | Status: DC | PRN

## 2022-09-09 MED ORDER — DEXTROSE 50 % IV SOLN
INTRAVENOUS | Status: AC
  Filled 2022-09-09: qty 50

## 2022-09-09 MED ORDER — ACETAMINOPHEN 650 MG RE SUPP: 650.0000 mg | Freq: Four times a day (QID) | RECTAL | Status: DC | PRN

## 2022-09-09 MED ORDER — ENOXAPARIN SODIUM 30 MG/0.3ML IJ SOSY
30.0000 mg | PREFILLED_SYRINGE | INTRAMUSCULAR | Status: DC
  Administered 2022-09-09 – 2022-09-10 (×2): 30 mg via SUBCUTANEOUS
  Filled 2022-09-09 (×2): qty 0.3

## 2022-09-09 MED ORDER — SENNOSIDES-DOCUSATE SODIUM 8.6-50 MG PO TABS: 1.0000 | ORAL_TABLET | Freq: Every evening | ORAL | Status: DC | PRN

## 2022-09-09 MED ORDER — ONDANSETRON HCL 4 MG/2ML IJ SOLN: 4.0000 mg | Freq: Four times a day (QID) | INTRAMUSCULAR | Status: DC | PRN

## 2022-09-09 MED ORDER — ONDANSETRON HCL 4 MG PO TABS: 4.0000 mg | ORAL_TABLET | Freq: Four times a day (QID) | ORAL | Status: DC | PRN

## 2022-09-09 NOTE — ED Notes (Signed)
Pt provided ham sandwich, pt states she is vegetarian and will not eat this.  Pt then given peanut butter and crackers.  Will continue to monitor BS.

## 2022-09-09 NOTE — H&P (Signed)
PCP:   Donato Schultz, DO   Chief Complaint:  Hypoglycemia  HPI: This is a 72 year old female with past medical history of HTN, HLD, T2DM.  Patient compliant with her medication.  Yesterday patient developed nausea and vomiting.  She was not able to eat due.  Because she was unable to eat, she did not take her linagliptin/metformin yesterday.  Her husband checked her glucose, found it to low.  He gave her Ensure [it was a diabetic Ensure], her sugars remained low.  911 was called. This is patient's third hypoglycemic episode.  Presentations similar.  Patient and husband typically try to avert hypoglycemic episodes by not taking her diabetic medication when ill or not eating.  Her husband typically gives her high glucose drink.  Today he tried to ensure.    In the ambulance patient remained hypoglycemic [numbers unknown].  EMS gave her oral glucose twice.  On arrival to the ER patient's glucose was 48.  She has been given 2 Amps of D50.  Glucose check was over 200.  It has been slowly dwindling.  Patient still not eating ER.  Most recent glucose 82.  Review of Systems:  Per HPI  Past Medical History: Past Medical History:  Diagnosis Date   DM (diabetes mellitus) (HCC)    Hepatitis B    Hyperlipidemia    Hypertension    Mild diastolic dysfunction    Past Surgical History:  Procedure Laterality Date   ABDOMINAL HYSTERECTOMY     TAH-- complications from D &C    Medications: Prior to Admission medications   Medication Sig Start Date End Date Taking? Authorizing Provider  amLODipine (NORVASC) 5 MG tablet Take 1 tablet (5 mg total) by mouth daily. 03/30/22  Yes Seabron Spates R, DO  atorvastatin (LIPITOR) 80 MG tablet Take 1 tablet (80 mg total) by mouth daily. Patient taking differently: Take 80 mg by mouth at bedtime. 03/30/22  Yes Seabron Spates R, DO  chlorthalidone (HYGROTON) 25 MG tablet Take 1 tablet (25 mg total) by mouth daily. 03/30/22  Yes Seabron Spates R,  DO  fenofibrate (TRICOR) 48 MG tablet Take 1 tablet (48 mg total) by mouth daily. 03/30/22  Yes Seabron Spates R, DO  metoprolol tartrate (LOPRESSOR) 25 MG tablet Take 1 tablet (25 mg total) by mouth 2 (two) times daily. 03/30/22  Yes Seabron Spates R, DO  olmesartan (BENICAR) 20 MG tablet Take 1 tablet (20 mg total) by mouth daily. 03/30/22  Yes Seabron Spates R, DO  blood glucose meter kit and supplies KIT Dispense based on patient and insurance preference. Use to test sugar once a day.  E11.51 10/17/17   Seabron Spates R, DO  CVS ASPIRIN LOW DOSE 81 MG EC tablet TAKE 1 TABLET BY MOUTH DAILY. Patient not taking: Reported on 09/09/2022 12/28/12   Collene Gobble, MD  glucose blood test strip Use to test sugar once a day.  E11.51 10/17/17   Donato Schultz, DO  icosapent Ethyl (VASCEPA) 1 g capsule TAKE 2 CAPSULES BY MOUTH 2 TIMES DAILY. Patient not taking: Reported on 09/09/2022 03/30/22   Seabron Spates R, DO  Lancets MISC Use to test sugar once a day.  E11.51 10/17/17   Donato Schultz, DO  linaGLIPtin-metFORMIN HCl ER (JENTADUETO XR) 05-998 MG TB24 Take 1 tablet by mouth daily. Patient not taking: Reported on 09/09/2022 03/30/22   Zola Button, Grayling Congress, DO  loratadine (CLARITIN) 10 MG tablet  TAKE 1 TABLET BY MOUTH EVERY DAY Patient not taking: Reported on 09/09/2022 03/10/19   Zola Button, Yvonne R, DO  MEGARED OMEGA-3 KRILL OIL 500 MG CAPS Take 1 capsule by mouth daily. Patient not taking: Reported on 09/09/2022 10/15/17   Zola Button, Grayling Congress, DO  meloxicam (MOBIC) 15 MG tablet Take 1 tablet (15 mg total) by mouth daily as needed for pain. Patient not taking: Reported on 09/09/2022 10/11/21   Zola Button, Myrene Buddy R, DO  mometasone (ELOCON) 0.1 % cream APPLY TOPICALLY TO AFFECTED AREA EVERY DAY Patient not taking: Reported on 09/09/2022 05/23/19   Zola Button, Grayling Congress, DO  nitroGLYCERIN (NITROSTAT) 0.4 MG SL tablet Place 1 tablet (0.4 mg total) under the tongue every 5  (five) minutes as needed for chest pain. Patient not taking: Reported on 09/09/2022 01/03/21   Zola Button, Grayling Congress, DO  nystatin cream (MYCOSTATIN) Apply 1 application topically 2 (two) times daily. Patient not taking: Reported on 09/09/2022 11/15/18   Seabron Spates R, DO  traZODone (DESYREL) 50 MG tablet Take 0.5-1 tablets (25-50 mg total) by mouth at bedtime as needed for sleep. Patient not taking: Reported on 09/09/2022 04/03/22   Zola Button, Grayling Congress, DO  valACYclovir (VALTREX) 1000 MG tablet Take 1 tablet (1,000 mg total) by mouth 3 (three) times daily. Patient not taking: Reported on 09/09/2022 09/12/16   Donato Schultz, DO  Vitamin D, Ergocalciferol, (DRISDOL) 1.25 MG (50000 UNIT) CAPS capsule Take 1 capsule (50,000 Units total) by mouth every 7 (seven) days. Patient not taking: Reported on 09/09/2022 01/03/21   Donato Schultz, DO    Allergies:  No Known Allergies  Social History:  reports that she has never smoked. She has never used smokeless tobacco. She reports that she does not drink alcohol and does not use drugs.  Family History: Family History  Problem Relation Age of Onset   Hypertension Mother    Hypertension Father    Hypertension Sister    Hypertension Sister     Physical Exam: Vitals:   09/09/22 1417 09/09/22 1419 09/09/22 1727 09/09/22 1824  BP:  (!) 158/71 (!) 146/101   Pulse:  92 93   Resp:  16 (!) 23   Temp:  98.1 F (36.7 C)  98.2 F (36.8 C)  TempSrc:  Oral  Oral  SpO2:  99% 97%   Weight: 55 kg     Height: 5' (1.524 m)       General: Alert and oriented x 3, well developed and nourished, no acute distress Eyes: PERRLA, pink conjunctiva, no scleral icterus ENT: Moist oral mucosa, neck supple, no thyromegaly Lungs: clear to ascultation, no wheeze, no crackles, no use of accessory muscles Cardiovascular: regular rate and rhythm, no regurgitation, no gallops, no murmurs. No carotid bruits, no JVD Abdomen: soft, positive BS,  non-tender, non-distended, no organomegaly, not an acute abdomen GU: not examined Neuro: CN II - XII grossly intact, sensation intact Musculoskeletal: strength 5/5 all extremities, no clubbing, cyanosis or edema Skin: no rash, no subcutaneous crepitation, no decubitus Psych: appropriate patient   Labs on Admission:  Recent Labs    09/09/22 1422  NA 134*  K 4.0  CL 101  CO2 22  GLUCOSE 60*  BUN 34*  CREATININE 1.42*  CALCIUM 9.5   Recent Labs    09/09/22 1422  AST 33  ALT 21  ALKPHOS 34*  BILITOT 0.5  PROT 7.7  ALBUMIN 4.2   Recent Labs    09/09/22  1422  LIPASE 92*   Recent Labs    09/09/22 1422  WBC 6.4  NEUTROABS 4.8  HGB 11.2*  HCT 33.2*  MCV 95.7  PLT 252    Radiological Exams on Admission: CT ABDOMEN PELVIS WO CONTRAST  Result Date: 09/09/2022 CLINICAL DATA:  Abdominal pain, elevated lipase EXAM: CT ABDOMEN AND PELVIS WITHOUT CONTRAST TECHNIQUE: Multidetector CT imaging of the abdomen and pelvis was performed following the standard protocol without IV contrast. RADIATION DOSE REDUCTION: This exam was performed according to the departmental dose-optimization program which includes automated exposure control, adjustment of the mA and/or kV according to patient size and/or use of iterative reconstruction technique. COMPARISON:  None Available. FINDINGS: Lower chest: No acute abnormality Hepatobiliary: Numerous gallstones within the gallbladder. No CT evidence of acute cholecystitis. No focal hepatic abnormality or biliary ductal dilatation. Pancreas: No focal abnormality or ductal dilatation. Spleen: No focal abnormality.  Normal size. Adrenals/Urinary Tract: No adrenal abnormality. No focal renal abnormality. No stones or hydronephrosis. Urinary bladder is unremarkable. Stomach/Bowel: Stomach, large and small bowel grossly unremarkable. Vascular/Lymphatic: Aortic atherosclerosis. No evidence of aneurysm or adenopathy. Reproductive: Prior hysterectomy.  No adnexal  masses. Other: No free fluid or free air. Musculoskeletal: No acute bony abnormality. IMPRESSION: No acute findings in the abdomen or pelvis. Cholelithiasis. Aortic atherosclerosis. Electronically Signed   By: Charlett Nose M.D.   On: 09/09/2022 17:18   DG Chest Port 1 View  Result Date: 09/09/2022 CLINICAL DATA:  Hypoglycemia EXAM: PORTABLE CHEST 1 VIEW COMPARISON:  04/24/2012 x-ray FINDINGS: Calcified aorta. Normal cardiopericardial silhouette when adjusting for technique. No consolidation, pneumothorax or effusion. No edema. Overlapping cardiac leads. IMPRESSION: No acute cardiopulmonary disease. Electronically Signed   By: Karen Kays M.D.   On: 09/09/2022 16:16    Assessment/Plan Present on Admission:  Hypoglycemia due to type 2 diabetes mellitus (HCC) -Sliding scale insulin every 2 hours -Oral hypoglycemic meds on hold -D5 NS at 75 cc/hr ordered -Admit to med telemetry.  Patient not a candidate for MedSurg due to the frequency of her glucose checks, every 2 hours for now -Has been educated not to use diabetic products to treat hypoglycemic episodes   Possible cystitis -Urine cultures collected.  Patient given IV Rocephin in ER.  Will continue given patient's hypoglycemia, decreased appetite, nausea and vomiting. -CT abdomen pelvis done and unrevealing.    Essential hypertension -Lopressor, Norvasc, Hygroton, olmesartan resumed   Hyperlipidemia LDL goal <70 -Atorvastatin, Tricor resumed   Insomnia -Trazodone on hold  Kule Gascoigne 09/09/2022, 8:19 PM

## 2022-09-09 NOTE — ED Triage Notes (Addendum)
Patient bib gems from home Husband reported patient "was not acting right" this morning, he gave sugar water Upon ems arrival CBG was 64, was given oral glucose, cbg dropped to 55 Given second round oral glucose, cbg right before arrival to ED was 64 again Patient a/o, cbg in triage is 48 Denies pain  EMS vital signs 160/78, hr 90, o2 97 ra, rr 20

## 2022-09-09 NOTE — ED Provider Notes (Addendum)
[place] EMERGENCY DEPARTMENT AT [facility] Provider Note   CSN: [id number] Arrival date & time: 09/09/22  1407     History  Chief Complaint  Patient presents with   Hypoglycemia    Dominique Hayes is a 72 y.o. female.  [place] interpreter service used.  Patient speaks minimal English.  Patient brought in by EMS for low blood sugar.  They gave her sugar.  Specifics of this is not known at this time.  Will try to clarify with accepting nurse.  But her blood sugar currently is 222.  Patient yesterday did not have much of an appetite.  Had some vomiting.  No vomiting today.  Patient denies any pain yesterday or today.  But patient has not wanted to eat much.  No fevers no upper respiratory symptoms no nausea vomiting or diarrhea today.  There was some vomiting yesterday.  Patient's husband reports that over a year ago she had some episodes with the low blood sugar.  Patient is being treated for diabetes.  Last saw her primary care Dr. June 30th.  Other significant past histories hypertension hyperlipidemia hepatitis B mild diastolic dysfunction.  Surgical history significant for abdominal hysterectomy.  Never used tobacco products.  Patient states that she feels fine now.  Feels much better since the sugar is up.  Denies any chest pain abdominal pain or any dysuria.  Addendum: Patient did receive oral glucose by patient's husband at home and EMS gave her oral glucose x 2.  She arrived here with a blood sugar of 48 we gave her an amp of D50.       Home Medications Prior to Admission medications   Medication Sig Start Date End Date Taking? Authorizing Provider  amLODipine (NORVASC) 5 MG tablet Take 1 tablet (5 mg total) by mouth daily. 03/30/22   Donato Schultz, DO  atorvastatin (LIPITOR) 80 MG tablet Take 1 tablet (80 mg total) by mouth daily. 03/30/22   Seabron Spates R, DO  blood glucose meter kit and supplies KIT Dispense based on patient and insurance  preference. Use to test sugar once a day.  E11.51 10/17/17   Donato Schultz, DO  chlorthalidone (HYGROTON) 25 MG tablet Take 1 tablet (25 mg total) by mouth daily. 03/30/22   Seabron Spates R, DO  [facility] ASPIRIN LOW DOSE 81 MG EC tablet TAKE 1 TABLET BY MOUTH DAILY. 12/28/12   Collene Gobble, MD  fenofibrate (TRICOR) 48 MG tablet Take 1 tablet (48 mg total) by mouth daily. 03/30/22   Seabron Spates R, DO  glucose blood test strip Use to test sugar once a day.  E11.51 10/17/17   Donato Schultz, DO  icosapent Ethyl (VASCEPA) 1 g capsule TAKE 2 CAPSULES BY MOUTH 2 TIMES DAILY. 03/30/22   Seabron Spates R, DO  Lancets MISC Use to test sugar once a day.  E11.51 10/17/17   Donato Schultz, DO  linaGLIPtin-metFORMIN HCl ER (JENTADUETO XR) 05-998 MG TB24 Take 1 tablet by mouth daily. 03/30/22   Seabron Spates R, DO  loratadine (CLARITIN) 10 MG tablet TAKE 1 TABLET BY MOUTH EVERY DAY 03/10/19   Lowne Chase, Yvonne R, DO  MEGARED OMEGA-3 KRILL OIL 500 MG CAPS Take 1 capsule by mouth daily. 10/15/17   Donato Schultz, DO  meloxicam (MOBIC) 15 MG tablet Take 1 tablet (15 mg total) by mouth daily as needed for pain. 10/11/21   Seabron Spates  R, DO  metoprolol tartrate (LOPRESSOR) 25 MG tablet Take 1 tablet (25 mg total) by mouth 2 (two) times daily. 03/30/22   Zola Button, Myrene Buddy R, DO  mometasone (ELOCON) 0.1 % cream APPLY TOPICALLY TO AFFECTED AREA EVERY DAY 05/23/19   Zola Button, Grayling Congress, DO  nitroGLYCERIN (NITROSTAT) 0.4 MG SL tablet Place 1 tablet (0.4 mg total) under the tongue every 5 (five) minutes as needed for chest pain. 01/03/21   Donato Schultz, DO  nystatin cream (MYCOSTATIN) Apply 1 application topically 2 (two) times daily. 11/15/18   Donato Schultz, DO  olmesartan (BENICAR) 20 MG tablet Take 1 tablet (20 mg total) by mouth daily. 03/30/22   Donato Schultz, DO  traZODone (DESYREL) 50 MG tablet Take 0.5-1 tablets (25-50 mg total) by mouth at  bedtime as needed for sleep. 04/03/22   Donato Schultz, DO  valACYclovir (VALTREX) 1000 MG tablet Take 1 tablet (1,000 mg total) by mouth 3 (three) times daily. 09/12/16   Donato Schultz, DO  Vitamin D, Ergocalciferol, (DRISDOL) 1.25 MG (50000 UNIT) CAPS capsule Take 1 capsule (50,000 Units total) by mouth every 7 (seven) days. 01/03/21   Donato Schultz, DO      Allergies    Patient has no known allergies.    Review of Systems   Review of Systems  Constitutional:  Negative for chills and fever.  HENT:  Negative for ear pain and sore throat.   Eyes:  Negative for pain and visual disturbance.  Respiratory:  Negative for cough and shortness of breath.   Cardiovascular:  Negative for chest pain and palpitations.  Gastrointestinal:  Positive for vomiting. Negative for abdominal pain.  Genitourinary:  Negative for dysuria and hematuria.  Musculoskeletal:  Negative for arthralgias and back pain.  Skin:  Negative for color change and rash.  Neurological:  Negative for seizures and syncope.  All other systems reviewed and are negative.   Physical Exam Updated Vital Signs BP (!) 158/71 (BP Location: Left Arm)   Pulse 92   Temp 98.1 F (36.7 C) (Oral)   Resp 16   Ht 1.524 m (5')   Wt 55 kg   SpO2 99%   BMI 23.68 kg/m  Physical Exam Vitals and nursing note reviewed.  Constitutional:      General: She is not in acute distress.    Appearance: Normal appearance. She is well-developed. She is not ill-appearing.  HENT:     Head: Normocephalic and atraumatic.     Mouth/Throat:     Mouth: Mucous membranes are dry.  Eyes:     Extraocular Movements: Extraocular movements intact.     Conjunctiva/sclera: Conjunctivae normal.     Pupils: Pupils are equal, round, and reactive to light.  Cardiovascular:     Rate and Rhythm: Normal rate and regular rhythm.     Heart sounds: No murmur heard. Pulmonary:     Effort: Pulmonary effort is normal. No respiratory distress.      Breath sounds: Normal breath sounds. No wheezing or rales.  Abdominal:     General: There is no distension.     Palpations: Abdomen is soft.     Tenderness: There is no abdominal tenderness. There is no guarding.  Musculoskeletal:        General: No swelling.     Cervical back: Normal range of motion and neck supple.     Right lower leg: No edema.     Left lower  leg: No edema.  Skin:    General: Skin is warm and dry.     Capillary Refill: Capillary refill takes less than 2 seconds.  Neurological:     General: No focal deficit present.     Mental Status: She is alert and oriented to person, place, and time.  Psychiatric:        Mood and Affect: Mood normal.     ED Results / Procedures / Treatments   Labs (all labs ordered are listed, but only abnormal results are displayed) Labs Reviewed  CBC WITH DIFFERENTIAL/PLATELET - Abnormal; Notable for the following components:      Result Value   RBC 3.47 (*)    Hemoglobin 11.2 (*)    HCT 33.2 (*)    All other components within normal limits  URINALYSIS, ROUTINE W REFLEX MICROSCOPIC - Abnormal; Notable for the following components:   Color, Urine STRAW (*)    APPearance HAZY (*)    Leukocytes,Ua LARGE (*)    Bacteria, UA RARE (*)    All other components within normal limits  CBG MONITORING, ED - Abnormal; Notable for the following components:   Glucose-Capillary 48 (*)    All other components within normal limits  CBG MONITORING, ED - Abnormal; Notable for the following components:   Glucose-Capillary 222 (*)    All other components within normal limits  COMPREHENSIVE METABOLIC PANEL  LIPASE, BLOOD    EKG EKG Interpretation Date/Time:  Saturday September 09 2022 15:19:18 EDT Ventricular Rate:  91 PR Interval:  155 QRS Duration:  66 QT Interval:  350 QTC Calculation: 431 R Axis:   37  Text Interpretation: Sinus rhythm Borderline repolarization abnormality No previous ECGs available Confirmed by Vanetta Mulders [phone]) on  09/09/2022 3:26:21 PM  Radiology No results found.  Procedures Procedures    Medications Ordered in ED Medications  0.9 %  sodium chloride infusion (has no administration in time range)  sodium chloride 0.9 % bolus 500 mL (has no administration in time range)  dextrose 50 % solution (  Given 09/09/22 1430)    ED Course/ Medical Decision Making/ A&P                                 Medical Decision Making Amount and/or Complexity of Data Reviewed Labs: ordered. Radiology: ordered.  Risk Prescription drug management. Decision regarding hospitalization.   Patient's blood sugar here is 222.  But sounds as if there definitely was hypoglycemia.  Could be due to poor p.o. intake and caloric intake the last couple days.  Will give IV fluids here and monitor blood sugars.  And see if she is able to maintain her blood sugars will also offer food here.  CBC white count 6.4 hemoglobin 11.2 platelets 252.  So no leukocytosis.  Complete metabolic panel is pending lipase is pending.  Labs depict a blood sugar 48 kind of around the same time that we got the 222.  Will clarify with nursing which ones accurate.  Maybe the blood sugar of 48 is what EMS got at home.  EKG without any acute cardiac changes.  Patient's blood pressure here is 158/71.  Oxygen saturation 99%.  Temp 98.1.  Heart rate 92 and respirations 16.  Clarification on blood sugars.  Blood sugar of 48 was upon arrival here.  Patient received an amp of D50.  Repeat was 222.  Will monitor patient's blood sugars very closely.  Will  offer some food to see if she is able to maintain her blood sugar.  She did take her daily diabetic medicine.  Which includes the Jentadueto XR.  CRITICAL CARE Performed by: Vanetta Mulders Total critical care time: 35 minutes Critical care time was exclusive of separately billable procedures and treating other patients. Critical care was necessary to treat or prevent imminent or life-threatening  deterioration. Critical care was time spent personally by me on the following activities: development of treatment plan with patient and/or surrogate as well as nursing, discussions with consultants, evaluation of patient's response to treatment, examination of patient, obtaining history from patient or surrogate, ordering and performing treatments and interventions, ordering and review of laboratory studies, ordering and review of radiographic studies, pulse oximetry and re-evaluation of patient's condition.   Critical care time for the blood sugar being of 48 upon arrival and needing an amp of D50.  Patient also had low blood sugars at home was given oral glucose by EMS without significant improvement.  After the amp of D50 blood sugar did come up to 22.  Will retry patient with some food.  But if not able to take it tolerate any food or take food or maintain her blood sugar she may require admission.  She is already got her long-acting diabetic medicine on board.  Patient's blood sugar now down to 82 which is in critical but it is dropping she is not really eating much.  Will discuss with hospitalist for admission at least overnight to monitor her blood sugars.  Will ask them if they want me to start D5 normal saline on her.  Also questionable urinary tract infection did not start antibiotics but have sent urine for culture.  Also patient with cholelithiasis but do not think this is necessarily all related to biliary colic but it could be playing some role.  No evidence of acute cholecystitis or any evidence of obstruction.   Final Clinical Impression(s) / ED Diagnoses Final diagnoses:  Hypoglycemia    Rx / DC Orders ED Discharge Orders     None         Vanetta Mulders, MD 09/09/22 1532    Vanetta Mulders, MD 09/09/22 1539    Vanetta Mulders, MD 09/09/22 [phone]

## 2022-09-10 DIAGNOSIS — I10 Essential (primary) hypertension: Secondary | ICD-10-CM | POA: Diagnosis present

## 2022-09-10 DIAGNOSIS — N390 Urinary tract infection, site not specified: Secondary | ICD-10-CM | POA: Diagnosis present

## 2022-09-10 DIAGNOSIS — K8020 Calculus of gallbladder without cholecystitis without obstruction: Secondary | ICD-10-CM | POA: Diagnosis not present

## 2022-09-10 DIAGNOSIS — Z7982 Long term (current) use of aspirin: Secondary | ICD-10-CM | POA: Diagnosis not present

## 2022-09-10 DIAGNOSIS — E11649 Type 2 diabetes mellitus with hypoglycemia without coma: Secondary | ICD-10-CM

## 2022-09-10 DIAGNOSIS — I471 Supraventricular tachycardia, unspecified: Secondary | ICD-10-CM | POA: Diagnosis present

## 2022-09-10 DIAGNOSIS — Z8249 Family history of ischemic heart disease and other diseases of the circulatory system: Secondary | ICD-10-CM | POA: Diagnosis not present

## 2022-09-10 DIAGNOSIS — E785 Hyperlipidemia, unspecified: Secondary | ICD-10-CM | POA: Diagnosis present

## 2022-09-10 DIAGNOSIS — F419 Anxiety disorder, unspecified: Secondary | ICD-10-CM | POA: Diagnosis present

## 2022-09-10 DIAGNOSIS — E162 Hypoglycemia, unspecified: Principal | ICD-10-CM | POA: Diagnosis present

## 2022-09-10 DIAGNOSIS — Z79899 Other long term (current) drug therapy: Secondary | ICD-10-CM | POA: Diagnosis not present

## 2022-09-10 DIAGNOSIS — G4700 Insomnia, unspecified: Secondary | ICD-10-CM | POA: Diagnosis present

## 2022-09-10 DIAGNOSIS — F32A Depression, unspecified: Secondary | ICD-10-CM | POA: Diagnosis present

## 2022-09-10 DIAGNOSIS — Z90710 Acquired absence of both cervix and uterus: Secondary | ICD-10-CM | POA: Diagnosis not present

## 2022-09-10 LAB — CBC WITH DIFFERENTIAL/PLATELET
Abs Immature Granulocytes: 0.02 10*3/uL (ref 0.00–0.07)
Basophils Absolute: 0 10*3/uL (ref 0.0–0.1)
Basophils Relative: 0 %
Eosinophils Absolute: 0 10*3/uL (ref 0.0–0.5)
Eosinophils Relative: 1 %
HCT: 32.2 % — ABNORMAL LOW (ref 36.0–46.0)
Hemoglobin: 10.7 g/dL — ABNORMAL LOW (ref 12.0–15.0)
Immature Granulocytes: 0 %
Lymphocytes Relative: 33 %
Lymphs Abs: 2.4 10*3/uL (ref 0.7–4.0)
MCH: 31.8 pg (ref 26.0–34.0)
MCHC: 33.2 g/dL (ref 30.0–36.0)
MCV: 95.5 fL (ref 80.0–100.0)
Monocytes Absolute: 0.8 10*3/uL (ref 0.1–1.0)
Monocytes Relative: 10 %
Neutro Abs: 4 10*3/uL (ref 1.7–7.7)
Neutrophils Relative %: 56 %
Platelets: 251 10*3/uL (ref 150–400)
RBC: 3.37 MIL/uL — ABNORMAL LOW (ref 3.87–5.11)
RDW: 12.6 % (ref 11.5–15.5)
WBC: 7.2 10*3/uL (ref 4.0–10.5)
nRBC: 0 % (ref 0.0–0.2)

## 2022-09-10 LAB — URINE CULTURE
Culture: <10000 — AB
Special Requests: NORMAL

## 2022-09-10 LAB — BASIC METABOLIC PANEL
Anion gap: 9 (ref 5–15)
BUN: 37 mg/dL — ABNORMAL HIGH (ref 8–23)
CO2: 24 mmol/L (ref 22–32)
Calcium: 9.4 mg/dL (ref 8.9–10.3)
Chloride: 102 mmol/L (ref 98–111)
Creatinine, Ser: 1.5 mg/dL — ABNORMAL HIGH (ref 0.44–1.00)
GFR, Estimated: 37 mL/min — ABNORMAL LOW (ref >60)
Glucose, Bld: 108 mg/dL — ABNORMAL HIGH (ref 70–99)
Potassium: 3.4 mmol/L — ABNORMAL LOW (ref 3.5–5.1)
Sodium: 135 mmol/L (ref 135–145)

## 2022-09-10 LAB — GLUCOSE, CAPILLARY
Glucose-Capillary: 109 mg/dL — ABNORMAL HIGH (ref 70–99)
Glucose-Capillary: 112 mg/dL — ABNORMAL HIGH (ref 70–99)
Glucose-Capillary: 117 mg/dL — ABNORMAL HIGH (ref 70–99)
Glucose-Capillary: 121 mg/dL — ABNORMAL HIGH (ref 70–99)
Glucose-Capillary: 131 mg/dL — ABNORMAL HIGH (ref 70–99)
Glucose-Capillary: 135 mg/dL — ABNORMAL HIGH (ref 70–99)
Glucose-Capillary: 150 mg/dL — ABNORMAL HIGH (ref 70–99)
Glucose-Capillary: 156 mg/dL — ABNORMAL HIGH (ref 70–99)
Glucose-Capillary: 166 mg/dL — ABNORMAL HIGH (ref 70–99)
Glucose-Capillary: 232 mg/dL — ABNORMAL HIGH (ref 70–99)

## 2022-09-10 MED ORDER — ORAL CARE MOUTH RINSE: 15.0000 mL | OROMUCOSAL | Status: DC | PRN

## 2022-09-10 MED ORDER — AMLODIPINE BESYLATE 5 MG PO TABS
10.0000 mg | ORAL_TABLET | Freq: Every day | ORAL | Status: DC
  Administered 2022-09-10 – 2022-09-11 (×2): 10 mg via ORAL
  Filled 2022-09-10 (×2): qty 2

## 2022-09-10 MED ORDER — INSULIN ASPART 100 UNIT/ML IJ SOLN
0.0000 [IU] | INTRAMUSCULAR | Status: DC
  Administered 2022-09-10 (×3): 2 [IU] via SUBCUTANEOUS
  Administered 2022-09-10: 5 [IU] via SUBCUTANEOUS
  Administered 2022-09-10: 3 [IU] via SUBCUTANEOUS
  Administered 2022-09-11: 2 [IU] via SUBCUTANEOUS

## 2022-09-10 MED ORDER — ATORVASTATIN CALCIUM 40 MG PO TABS
80.0000 mg | ORAL_TABLET | Freq: Every day | ORAL | Status: DC
  Administered 2022-09-10: 80 mg via ORAL
  Filled 2022-09-10: qty 2

## 2022-09-10 MED ORDER — SODIUM CHLORIDE 0.9 % IV SOLN
1.0000 g | Freq: Every day | INTRAVENOUS | Status: DC
  Administered 2022-09-10 – 2022-09-11 (×2): 1 g via INTRAVENOUS
  Filled 2022-09-10 (×2): qty 10

## 2022-09-10 MED ORDER — METOPROLOL TARTRATE 25 MG PO TABS
25.0000 mg | ORAL_TABLET | Freq: Two times a day (BID) | ORAL | Status: DC
  Administered 2022-09-10 – 2022-09-11 (×3): 25 mg via ORAL
  Filled 2022-09-10 (×3): qty 1

## 2022-09-10 NOTE — Plan of Care (Signed)
  Problem: Education: Goal: Knowledge of General Education information will improve Description: Including pain rating scale, medication(s)/side effects and non-pharmacologic comfort measures Outcome: Progressing   Problem: Safety: Goal: Ability to remain free from injury will improve Outcome: Progressing   

## 2022-09-10 NOTE — Progress Notes (Signed)
  Progress Note   Patient: Dominique Hayes ZOX:[id number] DOB: 07/04/1950 DOA: 09/09/2022     0 DOS: the patient was seen and examined on 09/10/2022   Brief hospital course: 72 year old female with past medical history of HTN, HLD, T2DM.  Patient compliant with her medication.  Yesterday patient developed nausea and vomiting.  She was not able to eat due.  Because she was unable to eat, she did not take her linagliptin/metformin yesterday.  Her husband checked her glucose, found it to low.  He gave her Ensure [it was a diabetic Ensure], her sugars remained low.  911 was called. This is patient's third hypoglycemic episode.   Assessment and Plan:  Hypoglycemia due to type 2 diabetes mellitus (HCC) -Continued on D5 today with improvement in glucose trends, now weaned off -continued on SSI as needed -holding home oral diabetic regimen for now -will consult diabetic coordinator given recurrence of hypoglycemic attacks -encourage PO     Possible cystitis -UA suggestive of UTI -Urine cx unremarkable -Would complete 3 days abx empirically    Essential hypertension -cont norvasc, metoprolol -diuretic on hold     Hyperlipidemia LDL goal <70 -cont Atorvastatin    Insomnia -Trazodone on hold  SVT -HR noted to be as high as 137 on tele this afternoon. Pt asymptomatic -Chart reviewed. Metoprolol was not resumed on admit.  -HR improved once metoprolol resumed   Subjective: Feeling better today  Physical Exam: Vitals:   09/10/22 0249 09/10/22 0604 09/10/22 1028 09/10/22 1548  BP: (!) 160/73 (!) 149/74 (!) 149/69 131/73  Pulse: 78 87 (!) 101 93  Resp: 16 16 16 16   Temp: 98.7 F (37.1 C) 98.2 F (36.8 C) 98 F (36.7 C) 98 F (36.7 C)  TempSrc: Oral Oral Oral Oral  SpO2: 98% 97% 98% 98%  Weight:      Height:       General exam: Awake, laying in bed, in nad Respiratory system: Normal respiratory effort, no wheezing Cardiovascular system: regular rate, s1, s2 Gastrointestinal system:  Soft, nondistended, positive BS Central nervous system: CN2-12 grossly intact, strength intact Extremities: Perfused, no clubbing Skin: Normal skin turgor, no notable skin lesions seen Psychiatry: Mood normal // no visual hallucinations   Data Reviewed:  Labs reviewed: Na 135, K 3.4, Cr 1.50, Hgb 10.7   Family Communication: Pt in room, family at bedside  Disposition: Status is: Inpatient Remains inpatient appropriate because: severity of illness  Planned Discharge Destination: Home    Author: Rickey Barbara, MD 09/10/2022 6:53 PM  For on call review [email or web address].

## 2022-09-10 NOTE — Hospital Course (Signed)
72 year old female with past medical history of HTN, HLD, T2DM.  Patient compliant with her medication.  Yesterday patient developed nausea and vomiting.  She was not able to eat due.  Because she was unable to eat, she did not take her linagliptin/metformin yesterday.  Her husband checked her glucose, found it to low.  He gave her Ensure [it was a diabetic Ensure], her sugars remained low.  911 was called. This is patient's third hypoglycemic episode.

## 2022-09-11 DIAGNOSIS — E11649 Type 2 diabetes mellitus with hypoglycemia without coma: Secondary | ICD-10-CM | POA: Diagnosis not present

## 2022-09-11 DIAGNOSIS — K8020 Calculus of gallbladder without cholecystitis without obstruction: Secondary | ICD-10-CM | POA: Diagnosis not present

## 2022-09-11 LAB — COMPREHENSIVE METABOLIC PANEL
ALT: 21 U/L (ref 0–44)
AST: 27 U/L (ref 15–41)
Albumin: 3.7 g/dL (ref 3.5–5.0)
Alkaline Phosphatase: 32 U/L — ABNORMAL LOW (ref 38–126)
Anion gap: 5 (ref 5–15)
BUN: 32 mg/dL — ABNORMAL HIGH (ref 8–23)
CO2: 25 mmol/L (ref 22–32)
Calcium: 9.8 mg/dL (ref 8.9–10.3)
Chloride: 108 mmol/L (ref 98–111)
Creatinine, Ser: 1.58 mg/dL — ABNORMAL HIGH (ref 0.44–1.00)
GFR, Estimated: 35 mL/min — ABNORMAL LOW (ref >60)
Glucose, Bld: 107 mg/dL — ABNORMAL HIGH (ref 70–99)
Potassium: 4.2 mmol/L (ref 3.5–5.1)
Sodium: 138 mmol/L (ref 135–145)
Total Bilirubin: 0.8 mg/dL (ref 0.3–1.2)
Total Protein: 6.9 g/dL (ref 6.5–8.1)

## 2022-09-11 LAB — CBC
HCT: 32.7 % — ABNORMAL LOW (ref 36.0–46.0)
Hemoglobin: 10.9 g/dL — ABNORMAL LOW (ref 12.0–15.0)
MCH: 32.7 pg (ref 26.0–34.0)
MCHC: 33.3 g/dL (ref 30.0–36.0)
MCV: 98.2 fL (ref 80.0–100.0)
Platelets: 231 10*3/uL (ref 150–400)
RBC: 3.33 MIL/uL — ABNORMAL LOW (ref 3.87–5.11)
RDW: 12.8 % (ref 11.5–15.5)
WBC: 7 10*3/uL (ref 4.0–10.5)
nRBC: 0 % (ref 0.0–0.2)

## 2022-09-11 LAB — GLUCOSE, CAPILLARY
Glucose-Capillary: 103 mg/dL — ABNORMAL HIGH (ref 70–99)
Glucose-Capillary: 103 mg/dL — ABNORMAL HIGH (ref 70–99)
Glucose-Capillary: 106 mg/dL — ABNORMAL HIGH (ref 70–99)
Glucose-Capillary: 133 mg/dL — ABNORMAL HIGH (ref 70–99)

## 2022-09-11 MED ORDER — CEFDINIR 300 MG PO CAPS: 300.0000 mg | ORAL_CAPSULE | Freq: Two times a day (BID) | ORAL | 0 refills | Status: AC

## 2022-09-11 NOTE — Inpatient Diabetes Management (Signed)
Inpatient Diabetes Program Recommendations  AACE/ADA: New Consensus Statement on Inpatient Glycemic Control (2015)  Target Ranges:  Prepandial:   less than 140 mg/dL      Peak postprandial:   less than 180 mg/dL (1-2 hours)      Critically ill patients:  140 - 180 mg/dL   Lab Results  Component Value Date   GLUCAP 133 (H) 09/11/2022   HGBA1C 6.6 (H) 07/07/2022    Review of Glycemic Control  Diabetes history: DM2 Outpatient Diabetes medications: Jentadueto XR 05-998 mg daily Current orders for Inpatient glycemic control: Novolog 0-15  Q3H  Inpatient Diabetes Program Recommendations:    Spoke with pt at bedside using interpreter services. Pt states she rarely has hyypoglycemia, maybe once a year. Thinks she was recently low d/t poor po intake, thought she may have eaten bad food. Monitors blood sugars several times/day and treats low blood sugars with "high-sugar drink." Sees PCP every 3-6 months regarding her diabetes. No other questions. Reviewed hypoglycemia s/s and treatment. HgbA1C - 6.6%. Had no other questions or concerns.  Will likely d/c today.  Thank you. Ailene Ards, RD, LDN, CDCES Inpatient Diabetes Coordinator [phone]

## 2022-09-11 NOTE — Plan of Care (Signed)
  Problem: Education: Goal: Knowledge of General Education information will improve Description: Including pain rating scale, medication(s)/side effects and non-pharmacologic comfort measures Outcome: Progressing   Problem: Health Behavior/Discharge Planning: Goal: Ability to manage health-related needs will improve Outcome: Progressing   Problem: Clinical Measurements: Goal: Will remain free from infection Outcome: Progressing   

## 2022-09-11 NOTE — TOC Transition Note (Addendum)
Transition of Care [facility]) - CM/SW Discharge Note   Patient Details  Name: Dominique Hayes MRN: [id number] Date of Birth: 11-28-1950  [facility] CM/SW Contact:  Lanier Clam, RN Phone Number: 09/11/2022, 12:46 PM   Clinical Narrative:  Spouse speaks English on his way to hospital for p/u. D/c plan home. No CM needs or orders.     Final next level of care: Home/Self Care Barriers to Discharge: No Barriers Identified   Patient Goals and CMS Choice CMS Medicare.gov Compare Post Acute Care list provided to:: Patient Represenative (must comment) Choice offered to / list presented to : Spouse  Discharge Placement                         Discharge Plan and Services Additional resources added to the After Visit Summary for       Post Acute Care Choice: Resumption of Svcs/PTA Provider                               Social Determinants of Health (SDOH) Interventions SDOH Screenings   Food Insecurity: No Food Insecurity (09/09/2022)  Housing: Low Risk  (09/09/2022)  Transportation Needs: No Transportation Needs (09/09/2022)  Utilities: Not At Risk (09/09/2022)  Depression (PHQ2-9): Low Risk  (07/21/2021)  Tobacco Use: Low Risk  (09/09/2022)     Readmission Risk Interventions     No data to display

## 2022-09-11 NOTE — Discharge Summary (Addendum)
Physician Discharge Summary   Patient: Dominique Hayes MRN: [id number] DOB: Aug 09, 1950  Admit date:     09/09/2022  Discharge date: 09/11/22  Discharge Physician: Rickey Barbara   PCP: Donato Schultz, DO   Recommendations at discharge:    Follow up with PCP in 1-2 weeks  Discharge Diagnoses: Principal Problem:   Hypoglycemia due to type 2 diabetes mellitus (HCC) Active Problems:   Essential hypertension   Hyperlipidemia LDL goal <70   Anxiety and depression   Hypoglycemia  Resolved Problems:   * No resolved hospital problems. *  Hospital Course: 72 year old female with past medical history of HTN, HLD, T2DM.  Patient compliant with her medication.  Yesterday patient developed nausea and vomiting.  She was not able to eat due.  Because she was unable to eat, she did not take her linagliptin/metformin yesterday.  Her husband checked her glucose, found it to low.  He gave her Ensure [it was a diabetic Ensure], her sugars remained low.  911 was called. This is patient's third hypoglycemic episode.   Assessment and Plan:  Hypoglycemia due to type 2 diabetes mellitus (HCC) -Continued on D5 today with improvement in glucose trends, later weaned off -Pt was continued on SSI as needed -Glycemic trends remained stable through the remainder of the course -Consulted diabetic coordinator. Recs to continue home meds on d/c    Possible cystitis -UA suggestive of UTI -Urine cx unremarkable -Would complete 3 days abx empirically. Omnicef prescribed on d/c    Essential hypertension -cont norvasc, metoprolol -resume home meds on d/c    Hyperlipidemia LDL goal <70 -cont Atorvastatin    Insomnia -Trazodone on hold   SVT -HR noted to be as high as 137 on tele early this admit. Pt asymptomatic -Chart reviewed. Metoprolol was not resumed on admit.  -HR improved once metoprolol resumed    Consultants:  Procedures performed:   Disposition: Home Diet recommendation:  Carb modified  diet DISCHARGE MEDICATION: Allergies as of 09/11/2022   No Known Allergies      Medication List     STOP taking these medications    meloxicam 15 MG tablet Commonly known as: MOBIC   valACYclovir 1000 MG tablet Commonly known as: Valtrex       TAKE these medications    amLODipine 5 MG tablet Commonly known as: NORVASC Take 1 tablet (5 mg total) by mouth daily.   atorvastatin 80 MG tablet Commonly known as: LIPITOR Take 1 tablet (80 mg total) by mouth daily. What changed: when to take this   blood glucose meter kit and supplies Kit Dispense based on patient and insurance preference. Use to test sugar once a day.  E11.51   cefdinir 300 MG capsule Commonly known as: OMNICEF Take 1 capsule (300 mg total) by mouth 2 (two) times daily for 1 day.   chlorthalidone 25 MG tablet Commonly known as: HYGROTON Take 1 tablet (25 mg total) by mouth daily.   [facility] Aspirin Low Dose 81 MG tablet Generic drug: aspirin EC TAKE 1 TABLET BY MOUTH DAILY.   fenofibrate 48 MG tablet Commonly known as: Tricor Take 1 tablet (48 mg total) by mouth daily.   glucose blood test strip Use to test sugar once a day.  E11.51   icosapent Ethyl 1 g capsule Commonly known as: Vascepa TAKE 2 CAPSULES BY MOUTH 2 TIMES DAILY.   Jentadueto XR 05-998 MG Tb24 Generic drug: linaGLIPtin-metFORMIN HCl ER Take 1 tablet by mouth daily.   Lancets Misc Use  to test sugar once a day.  E11.51   loratadine 10 MG tablet Commonly known as: CLARITIN TAKE 1 TABLET BY MOUTH EVERY DAY   MegaRed Omega-3 Krill Oil 500 MG Caps Take 1 capsule by mouth daily.   metoprolol tartrate 25 MG tablet Commonly known as: LOPRESSOR Take 1 tablet (25 mg total) by mouth 2 (two) times daily.   mometasone 0.1 % cream Commonly known as: ELOCON APPLY TOPICALLY TO AFFECTED AREA EVERY DAY   nitroGLYCERIN 0.4 MG SL tablet Commonly known as: NITROSTAT Place 1 tablet (0.4 mg total) under the tongue every 5 (five) minutes as  needed for chest pain.   nystatin cream Commonly known as: MYCOSTATIN Apply 1 application topically 2 (two) times daily.   olmesartan 20 MG tablet Commonly known as: BENICAR Take 1 tablet (20 mg total) by mouth daily.   traZODone 50 MG tablet Commonly known as: DESYREL Take 0.5-1 tablets (25-50 mg total) by mouth at bedtime as needed for sleep.   Vitamin D (Ergocalciferol) 1.25 MG (50000 UNIT) Caps capsule Commonly known as: DRISDOL Take 1 capsule (50,000 Units total) by mouth every 7 (seven) days.        Follow-up Information     Donato Schultz, DO Follow up in 2 week(s).   Specialty: Family Medicine Why: Hospital follow up Contact information: [address] [place] [address] [phone]                Discharge Exam: Ceasar Mons Weights   09/09/22 1417  Weight: 55 kg   General exam: Awake, laying in bed, in nad Respiratory system: Normal respiratory effort, no wheezing Cardiovascular system: regular rate, s1, s2 Gastrointestinal system: Soft, nondistended, positive BS Central nervous system: CN2-12 grossly intact, strength intact Extremities: Perfused, no clubbing Skin: Normal skin turgor, no notable skin lesions seen Psychiatry: Mood normal // no visual hallucinations   Condition at discharge: fair  The results of significant diagnostics from this hospitalization (including imaging, microbiology, ancillary and laboratory) are listed below for reference.   Imaging Studies: CT ABDOMEN PELVIS WO CONTRAST  Result Date: 09/09/2022 CLINICAL DATA:  Abdominal pain, elevated lipase EXAM: CT ABDOMEN AND PELVIS WITHOUT CONTRAST TECHNIQUE: Multidetector CT imaging of the abdomen and pelvis was performed following the standard protocol without IV contrast. RADIATION DOSE REDUCTION: This exam was performed according to the departmental dose-optimization program which includes automated exposure control, adjustment of the mA and/or kV according to  patient size and/or use of iterative reconstruction technique. COMPARISON:  None Available. FINDINGS: Lower chest: No acute abnormality Hepatobiliary: Numerous gallstones within the gallbladder. No CT evidence of acute cholecystitis. No focal hepatic abnormality or biliary ductal dilatation. Pancreas: No focal abnormality or ductal dilatation. Spleen: No focal abnormality.  Normal size. Adrenals/Urinary Tract: No adrenal abnormality. No focal renal abnormality. No stones or hydronephrosis. Urinary bladder is unremarkable. Stomach/Bowel: Stomach, large and small bowel grossly unremarkable. Vascular/Lymphatic: Aortic atherosclerosis. No evidence of aneurysm or adenopathy. Reproductive: Prior hysterectomy.  No adnexal masses. Other: No free fluid or free air. Musculoskeletal: No acute bony abnormality. IMPRESSION: No acute findings in the abdomen or pelvis. Cholelithiasis. Aortic atherosclerosis. Electronically Signed   By: Charlett Nose M.D.   On: 09/09/2022 17:18   DG Chest Port 1 View  Result Date: 09/09/2022 CLINICAL DATA:  Hypoglycemia EXAM: PORTABLE CHEST 1 VIEW COMPARISON:  04/24/2012 x-ray FINDINGS: Calcified aorta. Normal cardiopericardial silhouette when adjusting for technique. No consolidation, pneumothorax or effusion. No edema. Overlapping cardiac leads. IMPRESSION: No  acute cardiopulmonary disease. Electronically Signed   By: Karen Kays M.D.   On: 09/09/2022 16:16    Microbiology: Results for orders placed or performed during the hospital encounter of 09/09/22  Urine Culture     Status: Abnormal   Collection Time: 09/09/22  4:22 PM   Specimen: Urine, Clean Catch  Result Value Ref Range Status   Specimen Description   Final    URINE, CLEAN CATCH Performed at [facility], [address] [place], [place] [address]    Special Requests   Final    Normal Performed at [facility], [address], [place], [place] [address]    Culture (A)  Final     <10,000 COLONIES/mL INSIGNIFICANT GROWTH Performed at [facility], [address], [place], [place] [address]    Report Status 09/10/2022 FINAL  Final    Labs: CBC: Recent Labs  Lab 09/09/22 1422 09/10/22 0548 09/11/22 0603  WBC 6.4 7.2 7.0  NEUTROABS 4.8 4.0  --   HGB 11.2* 10.7* 10.9*  HCT 33.2* 32.2* 32.7*  MCV 95.7 95.5 98.2  PLT 252 251 231   Basic Metabolic Panel: Recent Labs  Lab 09/09/22 1422 09/10/22 0548 09/11/22 0603  NA 134* 135 138  K 4.0 3.4* 4.2  CL 101 102 108  CO2 22 24 25   GLUCOSE 60* 108* 107*  BUN 34* 37* 32*  CREATININE 1.42* 1.50* 1.58*  CALCIUM 9.5 9.4 9.8   Liver Function Tests: Recent Labs  Lab 09/09/22 1422 09/11/22 0603  AST 33 27  ALT 21 21  ALKPHOS 34* 32*  BILITOT 0.5 0.8  PROT 7.7 6.9  ALBUMIN 4.2 3.7   CBG: Recent Labs  Lab 09/10/22 2355 09/11/22 0254 09/11/22 0612 09/11/22 0750 09/11/22 1110  GLUCAP 109* 103* 103* 106* 133*    Discharge time spent: less than 30 minutes.  Signed: Rickey Barbara, MD [facility] 09/11/2022

## 2022-09-11 NOTE — Plan of Care (Signed)

## 2022-09-11 NOTE — Progress Notes (Signed)
AVS reviewed w/ pt and her husband ( husband speaks english) interpretor refused at this time- both verbalized an understanding - pt d/c home w/ spouse  in w/c

## 2022-09-13 ENCOUNTER — Telehealth: Payer: Self-pay | Admitting: *Deleted

## 2022-09-13 ENCOUNTER — Encounter: Payer: Self-pay | Admitting: *Deleted

## 2022-09-13 NOTE — Transitions of Care (Post Inpatient/ED Visit) (Signed)
09/13/2022  Name: UZOAMAKA Hayes MRN: [id number] DOB: June 29, 1950  Today's TOC FU Call Status: Today's TOC FU Call Status:: Successful TOC FU Call Completed TOC FU Call Complete Date: 09/13/22 Patient's Name and Date of Birth confirmed.  Transition Care Management Follow-up Telephone Call Date of Discharge: 09/11/22 Discharge Facility: [facility]) Type of Discharge: Inpatient Admission Primary Inpatient Discharge Diagnosis:: hypoglycemia, SVT; UTI How have you been since you were released from the hospital?: Better (per spouse: "Dominique Hayes is doing okay and better than Dominique Hayes was; Dominique Hayes took the one antibiotic that they gave her and Dominique Hayes seems fine now.  I am taking her to see Dr. Laury Axon next week") Any questions or concerns?: No  Items Reviewed: Did you receive and understand the discharge instructions provided?: Yes (reviewed with patient's spouse who verbalizes good understanding of same) Medications obtained,verified, and reconciled?: Partial Review Completed (Partial review completed; confirmed patient obtained/ took all newly Rx'd medications as instructed; spouse-manages medications and denies questions/ concerns around medications today) Reason for Partial Mediation Review: spouse declined full medication review Any new allergies since your discharge?: No Dietary orders reviewed?: Yes Type of Diet Ordered:: Regular Do you have support at home?: Yes People in Home: spouse Name of Support/Comfort Primary Source: Spouse reports patient is essentially independent in self-care activities; supportive spouse assists as/ if needed/ indicated  Medications Reviewed Today: Medications Reviewed Today     Reviewed by Michaela Corner, RN (Registered Nurse) on 09/13/22 at 1426  Med List Status: <None>   Medication Order Taking? Sig Documenting Provider Last Dose Status Informant  amLODipine (NORVASC) 5 MG tablet [id number] Yes Take 1 tablet (5 mg total) by mouth daily. Donato Schultz, DO Taking  Active Spouse/Significant Other  atorvastatin (LIPITOR) 80 MG tablet [id number] Yes Take 1 tablet (80 mg total) by mouth daily.  Patient taking differently: Take 80 mg by mouth at bedtime.   Donato Schultz, DO Taking Active Spouse/Significant Other  blood glucose meter kit and supplies KIT [id number]  Dispense based on patient and insurance preference. Use to test sugar once a day.  E11.51 Seabron Spates R, DO  Active   chlorthalidone (HYGROTON) 25 MG tablet [id number] Yes Take 1 tablet (25 mg total) by mouth daily. Seabron Spates R, DO Taking Active Spouse/Significant Other  [facility] ASPIRIN LOW DOSE 81 MG EC tablet [id number]  TAKE 1 TABLET BY MOUTH DAILY.  Patient not taking: Reported on 09/09/2022   Collene Gobble, MD  Active Spouse/Significant Other  fenofibrate (TRICOR) 48 MG tablet [id number]  Take 1 tablet (48 mg total) by mouth daily. Seabron Spates R, DO  Active Spouse/Significant Other  glucose blood test strip [id number]  Use to test sugar once a day.  E11.51 Seabron Spates R, DO  Active Spouse/Significant Other  icosapent Ethyl (VASCEPA) 1 g capsule [id number]  TAKE 2 CAPSULES BY MOUTH 2 TIMES DAILY.  Patient not taking: Reported on 09/09/2022   Donato Schultz, DO  Active Spouse/Significant Other  Lancets MISC [id number]  Use to test sugar once a day.  E11.51 Seabron Spates R, DO  Active   linaGLIPtin-metFORMIN HCl ER (JENTADUETO XR) 05-998 MG TB24 [id number]  Take 1 tablet by mouth daily.  Patient not taking: Reported on 09/09/2022   Donato Schultz, DO  Active Spouse/Significant Other  loratadine (CLARITIN) 10 MG tablet [id number]  TAKE 1 TABLET BY MOUTH EVERY DAY  Patient not taking: Reported on 09/09/2022   Zola Button,  Grayling Congress, DO  Active Spouse/Significant Other  MEGARED OMEGA-3 KRILL OIL 500 MG CAPS [id number]  Take 1 capsule by mouth daily.  Patient not taking: Reported on 09/09/2022   Donato Schultz, DO  Active Spouse/Significant Other   metoprolol tartrate (LOPRESSOR) 25 MG tablet [id number] Yes Take 1 tablet (25 mg total) by mouth 2 (two) times daily. Zola Button, Myrene Buddy R, DO Taking Active Spouse/Significant Other  mometasone (ELOCON) 0.1 % cream [id number]  APPLY TOPICALLY TO AFFECTED AREA EVERY DAY  Patient not taking: Reported on 09/09/2022   Donato Schultz, DO  Active Spouse/Significant Other  nitroGLYCERIN (NITROSTAT) 0.4 MG SL tablet [id number]  Place 1 tablet (0.4 mg total) under the tongue every 5 (five) minutes as needed for chest pain.  Patient not taking: Reported on 09/09/2022   Donato Schultz, DO  Active Spouse/Significant Other  nystatin cream (MYCOSTATIN) [id number]  Apply 1 application topically 2 (two) times daily.  Patient not taking: Reported on 09/09/2022   Donato Schultz, DO  Active Spouse/Significant Other  olmesartan (BENICAR) 20 MG tablet [id number] Yes Take 1 tablet (20 mg total) by mouth daily. Donato Schultz, DO Taking Active Spouse/Significant Other  traZODone (DESYREL) 50 MG tablet [id number]  Take 0.5-1 tablets (25-50 mg total) by mouth at bedtime as needed for sleep.  Patient not taking: Reported on 09/09/2022   Donato Schultz, DO  Active Spouse/Significant Other  Vitamin D, Ergocalciferol, (DRISDOL) 1.25 MG (50000 UNIT) CAPS capsule [id number]  Take 1 capsule (50,000 Units total) by mouth every 7 (seven) days.  Patient not taking: Reported on 09/09/2022   Donato Schultz, DO  Active Spouse/Significant Other           Home Care and Equipment/Supplies: Were Home Health Services Ordered?: No Any new equipment or medical supplies ordered?: No  Functional Questionnaire: Do you need assistance with bathing/showering or dressing?: No Do you need assistance with meal preparation?: No Do you need assistance with eating?: No Do you have difficulty maintaining continence: No Do you need assistance with getting out of bed/getting out of a chair/moving?: No Do you  have difficulty managing or taking your medications?: Yes (spouse assists with medications)  Follow up appointments reviewed: PCP Follow-up appointment confirmed?: Yes Date of PCP follow-up appointment?: 09/18/00 Follow-up Provider: PCP Specialist Hospital Follow-up appointment confirmed?: NA (verified not indicated per hospital discharging provider discharge notes) Do you need transportation to your follow-up appointment?: No Do you understand care options if your condition(s) worsen?: Yes-patient verbalized understanding  SDOH Interventions Today    Flowsheet Row Most Recent Value  SDOH Interventions   Food Insecurity Interventions Intervention Not Indicated  Transportation Interventions Intervention Not Indicated  [spouse provides transportation]      TOC Interventions Today    Flowsheet Row Most Recent Value  TOC Interventions   TOC Interventions Discussed/Reviewed TOC Interventions Discussed  [spouse declines need for ongoing/ further care coordination outreach]      Interventions Today    Flowsheet Row Most Recent Value  Chronic Disease   Chronic disease during today's visit Other  [hypoglcemia, SVT,  UTI]  General Interventions   General Interventions Discussed/Reviewed General Interventions Discussed, Doctor Visits, Durable Medical Equipment (DME)  Doctor Visits Discussed/Reviewed Doctor Visits Discussed, PCP  Durable Medical Equipment (DME) Other  [confirmed not currently requiring/ using assistive devices]  PCP/Specialist Visits Compliance with follow-up visit  Nutrition Interventions   Nutrition Discussed/Reviewed Nutrition Discussed  Pharmacy Interventions   Pharmacy Dicussed/Reviewed  Pharmacy Topics Discussed  Safety Interventions   Safety Discussed/Reviewed Safety Discussed, Fall Risk      Caryl Pina, RN, BSN, CCRN Alumnus RN CM Care Coordination/ Transition of Care- [facility] Care Management [phone]: direct office

## 2022-09-19 ENCOUNTER — Ambulatory Visit (INDEPENDENT_AMBULATORY_CARE_PROVIDER_SITE_OTHER): Payer: 59 | Admitting: Family Medicine

## 2022-09-19 VITALS — BP 120/80 | HR 54 | Temp 97.8°F | Resp 18 | Ht 60.0 in | Wt 119.0 lb

## 2022-09-19 DIAGNOSIS — E1165 Type 2 diabetes mellitus with hyperglycemia: Secondary | ICD-10-CM

## 2022-09-19 DIAGNOSIS — Z7984 Long term (current) use of oral hypoglycemic drugs: Secondary | ICD-10-CM | POA: Diagnosis not present

## 2022-09-19 DIAGNOSIS — N390 Urinary tract infection, site not specified: Secondary | ICD-10-CM | POA: Diagnosis not present

## 2022-09-19 DIAGNOSIS — I10 Essential (primary) hypertension: Secondary | ICD-10-CM | POA: Diagnosis not present

## 2022-09-19 DIAGNOSIS — E785 Hyperlipidemia, unspecified: Secondary | ICD-10-CM | POA: Diagnosis not present

## 2022-09-19 DIAGNOSIS — E1169 Type 2 diabetes mellitus with other specified complication: Secondary | ICD-10-CM

## 2022-09-19 DIAGNOSIS — Z23 Encounter for immunization: Secondary | ICD-10-CM

## 2022-09-19 DIAGNOSIS — E119 Type 2 diabetes mellitus without complications: Secondary | ICD-10-CM

## 2022-09-19 LAB — POC URINALSYSI DIPSTICK (AUTOMATED)
Bilirubin, UA: NEGATIVE
Blood, UA: NEGATIVE
Glucose, UA: NEGATIVE
Nitrite, UA: NEGATIVE
Protein, UA: NEGATIVE
Spec Grav, UA: 1.01 (ref 1.010–1.025)
Urobilinogen, UA: 0.2 U/dL
pH, UA: 6 (ref 5.0–8.0)

## 2022-09-19 MED ORDER — METOPROLOL TARTRATE 25 MG PO TABS
25.0000 mg | ORAL_TABLET | Freq: Two times a day (BID) | ORAL | 1 refills | Status: DC
Start: 2022-09-19 — End: 2022-12-19

## 2022-09-19 MED ORDER — ATORVASTATIN CALCIUM 80 MG PO TABS
80.0000 mg | ORAL_TABLET | Freq: Every day | ORAL | 1 refills | Status: DC
Start: 2022-09-19 — End: 2022-12-19

## 2022-09-19 MED ORDER — AMLODIPINE BESYLATE 5 MG PO TABS
5.0000 mg | ORAL_TABLET | Freq: Every day | ORAL | 1 refills | Status: DC
Start: 2022-09-19 — End: 2022-12-19

## 2022-09-19 MED ORDER — METFORMIN HCL 500 MG PO TABS: 500.0000 mg | ORAL_TABLET | Freq: Two times a day (BID) | ORAL | 3 refills | Status: DC

## 2022-09-19 MED ORDER — OLMESARTAN MEDOXOMIL 20 MG PO TABS
20.0000 mg | ORAL_TABLET | Freq: Every day | ORAL | 1 refills | Status: DC
Start: 2022-09-19 — End: 2022-12-19

## 2022-09-19 MED ORDER — FENOFIBRATE 48 MG PO TABS
48.0000 mg | ORAL_TABLET | Freq: Every day | ORAL | 1 refills | Status: DC
Start: 2022-09-19 — End: 2022-12-19

## 2022-09-20 ENCOUNTER — Encounter: Payer: Self-pay | Admitting: Family Medicine

## 2022-09-20 LAB — URINE CULTURE
MICRO NUMBER:: 15413319
Result:: NO GROWTH
SPECIMEN QUALITY:: ADEQUATE

## 2022-09-20 NOTE — Assessment & Plan Note (Signed)
Hgba1c to be checked , minimize simple carbs. Increase exercise as tolerated. Continue current meds

## 2022-09-20 NOTE — Assessment & Plan Note (Signed)
hgba1c to be checked minimize simple carbs. Increase exercise as tolerated. Continue current meds 

## 2022-09-20 NOTE — Assessment & Plan Note (Signed)
Tolerating statin, encouraged heart healthy diet, avoid trans fats, minimize simple carbs and saturated fats. Increase exercise as tolerated 

## 2022-09-20 NOTE — Assessment & Plan Note (Signed)
Well controlled, no changes to meds. Encouraged heart healthy diet such as the DASH diet and exercise as tolerated.  °

## 2022-09-20 NOTE — Progress Notes (Signed)
Established Patient Office Visit  Subjective   Patient ID: Dominique Hayes, female    DOB: June 20, 1950  Age: 72 y.o. MRN: [id number]  Chief Complaint  Patient presents with   Hypertension   Hyperlipidemia   Follow-up    HPI Discussed the use of AI scribe software for clinical note transcription with the patient, who gave verbal consent to proceed.  History of Present Illness   The patient, with a history of diabetes, was recently hospitalized for hypoglycemia and a UTI. Since discharge, the patient has been experiencing fluctuations in blood sugar levels. The patient's family member reports that the patient has been avoiding sugar due to fear of hyperglycemia, which has resulted in episodes of hypoglycemia. The patient was previously on a combination medication that included metformin, but it was discontinued due to the hypoglycemic episodes. The patient denies any current symptoms of a UTI.      Patient Active Problem List   Diagnosis Date Noted   Hypoglycemia 09/10/2022   Hypoglycemia due to type 2 diabetes mellitus (HCC) 09/09/2022   Anxiety and depression 09/09/2022   Uncontrolled type 2 diabetes mellitus with hyperglycemia (HCC) 10/04/2020   Depression, major, single episode, severe (HCC) 04/23/2017   Hyperlipidemia associated with type 2 diabetes mellitus (HCC) 03/12/2017   Anxiety 03/12/2017   Cold sore 09/12/2016   DM (diabetes mellitus) type II uncontrolled, periph vascular disorder 04/03/2016   Hyperlipidemia LDL goal <70 04/03/2016   Mild diastolic dysfunction 05/26/2013   DM (diabetes mellitus) type II uncontrolled with eye manifestation 10/18/2012   Hepatitis B 10/18/2012   Essential hypertension 10/13/2012   Nonspecific abnormal electrocardiogram (ECG) (EKG) 10/13/2012   Past Medical History:  Diagnosis Date   DM (diabetes mellitus) (HCC)    Hepatitis B    Hyperlipidemia    Hypertension    Mild diastolic dysfunction    Past Surgical History:  Procedure  Laterality Date   ABDOMINAL HYSTERECTOMY     TAH-- complications from D &C   Social History   Tobacco Use   Smoking status: Never   Smokeless tobacco: Never  Substance Use Topics   Alcohol use: No    Alcohol/week: 0.0 standard drinks of alcohol   Drug use: No   Social History   Socioeconomic History   Marital status: Married    Spouse name: Not on file   Number of children: 1   Years of education: Not on file   Highest education level: Not on file  Occupational History   Occupation: nail tech  Tobacco Use   Smoking status: Never   Smokeless tobacco: Never  Substance and Sexual Activity   Alcohol use: No    Alcohol/week: 0.0 standard drinks of alcohol   Drug use: No   Sexual activity: Yes  Other Topics Concern   Not on file  Social History Narrative   Marital status: married      Employment: employed; p/t Chief Strategy Officer   Exercise--- treadmill everday for 30 min   Social Determinants of Health   Financial Resource Strain: Not on file  Food Insecurity: No Food Insecurity (09/13/2022)   Hunger Vital Sign    Worried About Running Out of Food in the Last Year: Never true    Ran Out of Food in the Last Year: Never true  Transportation Needs: No Transportation Needs (09/13/2022)   PRAPARE - Transportation    Lack of Transportation (Medical): No    Lack of Transportation (Non-Medical): No  Physical Activity: Not on file  Stress:  Not on file  Social Connections: Not on file  Intimate Partner Violence: Not At Risk (09/09/2022)   Humiliation, Afraid, Rape, and Kick questionnaire    Fear of Current or Ex-Partner: No    Emotionally Abused: No    Physically Abused: No    Sexually Abused: No   Family Status  Relation Name Status   Mother  Alive   Father  Deceased at age 29       htn--- died in sleep   Sister  Alive   Sister  Alive   Brother  Alive   Brother  Alive   Brother  Alive   Brother  Alive   Brother  Alive  No partnership data on file   Family History   Problem Relation Age of Onset   Hypertension Mother    Hypertension Father    Hypertension Sister    Hypertension Sister    No Known Allergies    ROS    Objective:     BP 120/80 (BP Location: Left Arm, Patient Position: Sitting, Cuff Size: Normal)   Pulse (!) 54   Temp 97.8 F (36.6 C) (Oral)   Resp 18   Ht 5' (1.524 m)   Wt 119 lb (54 kg)   SpO2 100%   BMI 23.24 kg/m  BP Readings from Last 3 Encounters:  09/19/22 120/80  09/11/22 (!) 156/75  03/30/22 118/78   Wt Readings from Last 3 Encounters:  09/19/22 119 lb (54 kg)  09/09/22 121 lb 4.1 oz (55 kg)  03/30/22 115 lb (52.2 kg)   SpO2 Readings from Last 3 Encounters:  09/19/22 100%  09/11/22 100%  03/30/22 99%      Physical Exam   Results for orders placed or performed in visit on 09/19/22  POCT Urinalysis Dipstick (Automated)  Result Value Ref Range   Color, UA yellow    Clarity, UA cloudy    Glucose, UA Negative Negative   Bilirubin, UA neg    Ketones, UA trace    Spec Grav, UA 1.010 1.010 - 1.025   Blood, UA neg    pH, UA 6.0 5.0 - 8.0   Protein, UA Negative Negative   Urobilinogen, UA 0.2 0.2 or 1.0 E.U./dL   Nitrite, UA neg    Leukocytes, UA Large (3+) (A) Negative    Last CBC Lab Results  Component Value Date   WBC 7.0 09/11/2022   HGB 10.9 (L) 09/11/2022   HCT 32.7 (L) 09/11/2022   MCV 98.2 09/11/2022   MCH 32.7 09/11/2022   RDW 12.8 09/11/2022   PLT 231 09/11/2022   Last metabolic panel Lab Results  Component Value Date   GLUCOSE 107 (H) 09/11/2022   NA 138 09/11/2022   K 4.2 09/11/2022   CL 108 09/11/2022   CO2 25 09/11/2022   BUN 32 (H) 09/11/2022   CREATININE 1.58 (H) 09/11/2022   GFRNONAA 35 (L) 09/11/2022   CALCIUM 9.8 09/11/2022   PROT 6.9 09/11/2022   ALBUMIN 3.7 09/11/2022   BILITOT 0.8 09/11/2022   ALKPHOS 32 (L) 09/11/2022   AST 27 09/11/2022   ALT 21 09/11/2022   ANIONGAP 5 09/11/2022   Last lipids Lab Results  Component Value Date   CHOL 133  07/07/2022   HDL 33.50 (L) 07/07/2022   LDLCALC 67 07/07/2022   LDLDIRECT 137.0 03/30/2022   TRIG 160.0 (H) 07/07/2022   CHOLHDL 4 07/07/2022   Last hemoglobin A1c Lab Results  Component Value Date   HGBA1C 6.6 (H) 07/07/2022  Last thyroid functions Lab Results  Component Value Date   TSH 1.05 05/26/2013   Last vitamin D Lab Results  Component Value Date   VD25OH 76.53 03/08/2020   Last vitamin B12 and Folate No results found for: "VITAMINB12", "FOLATE"    The 10-year ASCVD risk score (Arnett DK, et al., 2019) is: 20%    Assessment & Plan:   Problem List Items Addressed This Visit       Unprioritized   Hyperlipidemia LDL goal <70   Relevant Medications   amLODipine (NORVASC) 5 MG tablet   atorvastatin (LIPITOR) 80 MG tablet   fenofibrate (TRICOR) 48 MG tablet   metoprolol tartrate (LOPRESSOR) 25 MG tablet   olmesartan (BENICAR) 20 MG tablet   Uncontrolled type 2 diabetes mellitus with hyperglycemia (HCC)    hgba1c to be checked  minimize simple carbs. Increase exercise as tolerated. Continue current meds       Relevant Medications   atorvastatin (LIPITOR) 80 MG tablet   olmesartan (BENICAR) 20 MG tablet   metFORMIN (GLUCOPHAGE) 500 MG tablet   Hyperlipidemia associated with type 2 diabetes mellitus (HCC)    Tolerating statin, encouraged heart healthy diet, avoid trans fats, minimize simple carbs and saturated fats. Increase exercise as tolerated       Relevant Medications   amLODipine (NORVASC) 5 MG tablet   atorvastatin (LIPITOR) 80 MG tablet   fenofibrate (TRICOR) 48 MG tablet   metoprolol tartrate (LOPRESSOR) 25 MG tablet   olmesartan (BENICAR) 20 MG tablet   metFORMIN (GLUCOPHAGE) 500 MG tablet   Essential hypertension    Well controlled, no changes to meds. Encouraged heart healthy diet such as the DASH diet and exercise as tolerated.        Relevant Medications   amLODipine (NORVASC) 5 MG tablet   atorvastatin (LIPITOR) 80 MG tablet    fenofibrate (TRICOR) 48 MG tablet   metoprolol tartrate (LOPRESSOR) 25 MG tablet   olmesartan (BENICAR) 20 MG tablet   Other Visit Diagnoses     Urinary tract infection without hematuria, site unspecified    -  Primary   Relevant Orders   POCT Urinalysis Dipstick (Automated) (Completed)   Urine Culture   Primary hypertension       Relevant Medications   amLODipine (NORVASC) 5 MG tablet   atorvastatin (LIPITOR) 80 MG tablet   fenofibrate (TRICOR) 48 MG tablet   metoprolol tartrate (LOPRESSOR) 25 MG tablet   olmesartan (BENICAR) 20 MG tablet   Hyperlipidemia, unspecified hyperlipidemia type       Relevant Medications   amLODipine (NORVASC) 5 MG tablet   atorvastatin (LIPITOR) 80 MG tablet   fenofibrate (TRICOR) 48 MG tablet   metoprolol tartrate (LOPRESSOR) 25 MG tablet   olmesartan (BENICAR) 20 MG tablet   Controlled type 2 diabetes mellitus without complication, without long-term current use of insulin (HCC)       Relevant Medications   atorvastatin (LIPITOR) 80 MG tablet   olmesartan (BENICAR) 20 MG tablet   metFORMIN (GLUCOPHAGE) 500 MG tablet   Need for influenza vaccination       Relevant Orders   Flu Vaccine Trivalent High Dose (Fluad) (Completed)     Assessment and Plan    Diabetes Mellitus Recent hypoglycemia due to inconsistent eating habits and fear of hyperglycemia. Blood glucose levels have been fluctuating, with some readings >200. Previously on a combination medication that included metformin, which was discontinued due to hypoglycemia. -Start Metformin monotherapy, with a dose adjustment to prevent hypoglycemia. -Take Metformin twice  daily, once in the morning and once at night, regardless of meals. -Check blood glucose levels regularly and adjust diet and medication as needed.  Urinary Tract Infection Recent hospitalization for UTI. Currently asymptomatic. -Check urine today to ensure resolution of UTI.  General Health Maintenance -Administer influenza  vaccine today. -Check labs in three months due to recent medication change.        No follow-ups on file.    Donato Schultz, DO

## 2022-09-29 ENCOUNTER — Other Ambulatory Visit: Payer: Self-pay | Admitting: Family Medicine

## 2022-09-29 DIAGNOSIS — I10 Essential (primary) hypertension: Secondary | ICD-10-CM

## 2022-11-14 DIAGNOSIS — E1122 Type 2 diabetes mellitus with diabetic chronic kidney disease: Secondary | ICD-10-CM | POA: Diagnosis not present

## 2022-11-14 DIAGNOSIS — I129 Hypertensive chronic kidney disease with stage 1 through stage 4 chronic kidney disease, or unspecified chronic kidney disease: Secondary | ICD-10-CM | POA: Diagnosis not present

## 2022-11-14 DIAGNOSIS — I5189 Other ill-defined heart diseases: Secondary | ICD-10-CM | POA: Diagnosis not present

## 2022-11-14 DIAGNOSIS — N1832 Chronic kidney disease, stage 3b: Secondary | ICD-10-CM | POA: Diagnosis not present

## 2022-12-19 ENCOUNTER — Ambulatory Visit: Payer: 59 | Admitting: Family Medicine

## 2022-12-19 VITALS — BP 130/70 | HR 61 | Temp 98.9°F | Resp 16 | Ht 60.0 in | Wt 121.4 lb

## 2022-12-19 DIAGNOSIS — I25700 Atherosclerosis of coronary artery bypass graft(s), unspecified, with unstable angina pectoris: Secondary | ICD-10-CM | POA: Diagnosis not present

## 2022-12-19 DIAGNOSIS — Z7984 Long term (current) use of oral hypoglycemic drugs: Secondary | ICD-10-CM

## 2022-12-19 DIAGNOSIS — E2839 Other primary ovarian failure: Secondary | ICD-10-CM

## 2022-12-19 DIAGNOSIS — R82998 Other abnormal findings in urine: Secondary | ICD-10-CM

## 2022-12-19 DIAGNOSIS — E785 Hyperlipidemia, unspecified: Secondary | ICD-10-CM

## 2022-12-19 DIAGNOSIS — E119 Type 2 diabetes mellitus without complications: Secondary | ICD-10-CM | POA: Diagnosis not present

## 2022-12-19 DIAGNOSIS — I10 Essential (primary) hypertension: Secondary | ICD-10-CM

## 2022-12-19 LAB — POC URINALSYSI DIPSTICK (AUTOMATED)
Bilirubin, UA: NEGATIVE
Blood, UA: NEGATIVE
Glucose, UA: NEGATIVE
Ketones, UA: NEGATIVE
Nitrite, UA: NEGATIVE
Protein, UA: NEGATIVE
Spec Grav, UA: 1.01 (ref 1.010–1.025)
Urobilinogen, UA: 0.2 U/dL
pH, UA: 7.5 (ref 5.0–8.0)

## 2022-12-19 MED ORDER — METFORMIN HCL 500 MG PO TABS
500.0000 mg | ORAL_TABLET | Freq: Two times a day (BID) | ORAL | 3 refills | Status: DC
Start: 2022-12-19 — End: 2022-12-19

## 2022-12-19 MED ORDER — METOPROLOL TARTRATE 25 MG PO TABS: 25.0000 mg | ORAL_TABLET | Freq: Two times a day (BID) | ORAL | 1 refills | Status: DC

## 2022-12-19 MED ORDER — FENOFIBRATE 48 MG PO TABS: 48.0000 mg | ORAL_TABLET | Freq: Every day | ORAL | 1 refills | Status: DC

## 2022-12-19 MED ORDER — METFORMIN HCL 500 MG PO TABS: 500.0000 mg | ORAL_TABLET | Freq: Two times a day (BID) | ORAL | 3 refills | Status: DC

## 2022-12-19 MED ORDER — METOPROLOL TARTRATE 25 MG PO TABS
25.0000 mg | ORAL_TABLET | Freq: Two times a day (BID) | ORAL | 1 refills | Status: DC
Start: 2022-12-19 — End: 2022-12-19

## 2022-12-19 MED ORDER — AMLODIPINE BESYLATE 5 MG PO TABS
5.0000 mg | ORAL_TABLET | Freq: Every day | ORAL | 1 refills | Status: DC
Start: 2022-12-19 — End: 2022-12-19

## 2022-12-19 MED ORDER — FENOFIBRATE 48 MG PO TABS
48.0000 mg | ORAL_TABLET | Freq: Every day | ORAL | 1 refills | Status: DC
Start: 2022-12-19 — End: 2022-12-19

## 2022-12-19 MED ORDER — ATORVASTATIN CALCIUM 80 MG PO TABS: 80.0000 mg | ORAL_TABLET | Freq: Every day | ORAL | 1 refills | Status: DC

## 2022-12-19 MED ORDER — OLMESARTAN MEDOXOMIL 20 MG PO TABS
20.0000 mg | ORAL_TABLET | Freq: Every day | ORAL | 1 refills | Status: DC
Start: 2022-12-19 — End: 2023-05-22

## 2022-12-19 MED ORDER — AMLODIPINE BESYLATE 5 MG PO TABS: 5.0000 mg | ORAL_TABLET | Freq: Every day | ORAL | 1 refills | Status: DC

## 2022-12-19 MED ORDER — CHLORTHALIDONE 25 MG PO TABS: 25.0000 mg | ORAL_TABLET | Freq: Every day | ORAL | 1 refills | Status: DC

## 2022-12-19 MED ORDER — ATORVASTATIN CALCIUM 80 MG PO TABS
80.0000 mg | ORAL_TABLET | Freq: Every day | ORAL | 1 refills | Status: DC
Start: 2022-12-19 — End: 2022-12-19

## 2022-12-19 NOTE — Progress Notes (Unsigned)
Established Patient Office Visit  Subjective   Patient ID: Dominique Hayes, female    DOB: 1950/04/07  Age: 72 y.o. MRN: [id number]  Chief Complaint  Patient presents with   Hypertension   Hyperlipidemia   Diabetes   Follow-up    HPI Discussed the use of AI scribe software for clinical note transcription with the patient, who gave verbal consent to proceed.  History of Present Illness   The patient, with a history of diabetes, presents for f/u.   She requested a refill of her metformin, which she reported as being effective in maintaining her blood sugar levels. She also reported a normal diet, which aids in controlling her sugar levels.  The patient had recently seen a kidney specialist and was anxious about the results of her tests. Upon review, the kidney function was slightly elevated but not significantly concerning.  The patient also reported a recent visit to an eye specialist, who had recommended surgery. However, the patient was hesitant due to the lack of guaranteed improvement post-surgery. She reported taking vitamins and noticing an improvement in her vision.  The patient declined a mammogram, stating she felt it was unnecessary due to her age.  The patient also reported recent issues with foot swelling, which she attributed to a new medication. The swelling had improved slightly after discontinuing the medication.  Lastly, the patient reported issues with both falling asleep and staying asleep. She had been experiencing this issue for several days and was seeking a solution.      Patient Active Problem List   Diagnosis Date Noted   Hypoglycemia 09/10/2022   Hypoglycemia due to type 2 diabetes mellitus (HCC) 09/09/2022   Anxiety and depression 09/09/2022   Uncontrolled type 2 diabetes mellitus with hyperglycemia (HCC) 10/04/2020   Depression, major, single episode, severe (HCC) 04/23/2017   Hyperlipidemia associated with type 2 diabetes mellitus (HCC) 03/12/2017    Anxiety 03/12/2017   Cold sore 09/12/2016   DM (diabetes mellitus) type II uncontrolled, periph vascular disorder 04/03/2016   Hyperlipidemia LDL goal <70 04/03/2016   Mild diastolic dysfunction 05/26/2013   DM (diabetes mellitus) type II uncontrolled with eye manifestation 10/18/2012   Hepatitis B 10/18/2012   Essential hypertension 10/13/2012   Nonspecific abnormal electrocardiogram (ECG) (EKG) 10/13/2012   Past Medical History:  Diagnosis Date   DM (diabetes mellitus) (HCC)    Hepatitis B    Hyperlipidemia    Hypertension    Mild diastolic dysfunction    Past Surgical History:  Procedure Laterality Date   ABDOMINAL HYSTERECTOMY     TAH-- complications from D &C   Social History   Tobacco Use   Smoking status: Never   Smokeless tobacco: Never  Substance Use Topics   Alcohol use: No    Alcohol/week: 0.0 standard drinks of alcohol   Drug use: No   Social History   Socioeconomic History   Marital status: Married    Spouse name: Not on file   Number of children: 1   Years of education: Not on file   Highest education level: Not on file  Occupational History   Occupation: nail tech  Tobacco Use   Smoking status: Never   Smokeless tobacco: Never  Substance and Sexual Activity   Alcohol use: No    Alcohol/week: 0.0 standard drinks of alcohol   Drug use: No   Sexual activity: Yes  Other Topics Concern   Not on file  Social History Narrative   Marital status: married  Employment: employed; p/t Chief Strategy Officer   Exercise--- treadmill everday for 30 min   Social Determinants of Health   Financial Resource Strain: Not on file  Food Insecurity: No Food Insecurity (09/13/2022)   Hunger Vital Sign    Worried About Running Out of Food in the Last Year: Never true    Ran Out of Food in the Last Year: Never true  Transportation Needs: No Transportation Needs (09/13/2022)   PRAPARE - Administrator, Civil Service (Medical): No    Lack of Transportation  (Non-Medical): No  Physical Activity: Not on file  Stress: Not on file  Social Connections: Not on file  Intimate Partner Violence: Not At Risk (09/09/2022)   Humiliation, Afraid, Rape, and Kick questionnaire    Fear of Current or Ex-Partner: No    Emotionally Abused: No    Physically Abused: No    Sexually Abused: No   Family Status  Relation Name Status   Mother  Alive   Father  Deceased at age 76       htn--- died in sleep   Sister  Alive   Sister  Alive   Brother  Alive   Brother  Alive   Brother  Alive   Brother  Alive   Brother  Alive  No partnership data on file   Family History  Problem Relation Age of Onset   Hypertension Mother    Hypertension Father    Hypertension Sister    Hypertension Sister    No Known Allergies    ROS    Objective:     BP 130/70 (BP Location: Left Arm, Patient Position: Sitting, Cuff Size: Normal)   Pulse 61   Temp 98.9 F (37.2 C) (Oral)   Resp 16   Ht 5' (1.524 m)   Wt 121 lb 6.4 oz (55.1 kg)   SpO2 98%   BMI 23.71 kg/m  BP Readings from Last 3 Encounters:  12/19/22 130/70  09/19/22 120/80  09/11/22 (!) 156/75   Wt Readings from Last 3 Encounters:  12/19/22 121 lb 6.4 oz (55.1 kg)  09/19/22 119 lb (54 kg)  09/09/22 121 lb 4.1 oz (55 kg)   SpO2 Readings from Last 3 Encounters:  12/19/22 98%  09/19/22 100%  09/11/22 100%      Physical Exam   Results for orders placed or performed in visit on 12/19/22  Urine Culture   Specimen: Urine  Result Value Ref Range   MICRO NUMBER: [id number]    SPECIMEN QUALITY: Adequate    Sample Source URINE    STATUS: FINAL    Result: No Growth   Lipid panel  Result Value Ref Range   Cholesterol 216 (H) 0 - 200 mg/dL   Triglycerides 284.1 (H) 0.0 - 149.0 mg/dL   HDL 32.44 (L) >01.02 mg/dL   VLDL 72.5 (H) 0.0 - 36.6 mg/dL   LDL Cholesterol 440 (H) 0 - 99 mg/dL   Total CHOL/HDL Ratio 6    NonHDL 181.57   CBC with Differential/Platelet  Result Value Ref Range   WBC 5.1 4.0  - 10.5 K/uL   RBC 3.77 (L) 3.87 - 5.11 Mil/uL   Hemoglobin 12.1 12.0 - 15.0 g/dL   HCT 34.7 42.5 - 95.6 %   MCV 96.5 78.0 - 100.0 fl   MCHC 33.3 30.0 - 36.0 g/dL   RDW 38.7 56.4 - 33.2 %   Platelets 300.0 150.0 - 400.0 K/uL   Neutrophils Relative % 52.3 43.0 - 77.0 %  Lymphocytes Relative 31.2 12.0 - 46.0 %   Monocytes Relative 13.3 (H) 3.0 - 12.0 %   Eosinophils Relative 1.5 0.0 - 5.0 %   Basophils Relative 1.7 0.0 - 3.0 %   Neutro Abs 2.7 1.4 - 7.7 K/uL   Lymphs Abs 1.6 0.7 - 4.0 K/uL   Monocytes Absolute 0.7 0.1 - 1.0 K/uL   Eosinophils Absolute 0.1 0.0 - 0.7 K/uL   Basophils Absolute 0.1 0.0 - 0.1 K/uL  Comprehensive metabolic panel  Result Value Ref Range   Sodium 138 135 - 145 mEq/L   Potassium 4.2 3.5 - 5.1 mEq/L   Chloride 103 96 - 112 mEq/L   CO2 29 19 - 32 mEq/L   Glucose, Bld 98 70 - 99 mg/dL   BUN 26 (H) 6 - 23 mg/dL   Creatinine, Ser 1.61 (H) 0.40 - 1.20 mg/dL   Total Bilirubin 0.5 0.2 - 1.2 mg/dL   Alkaline Phosphatase 33 (L) 39 - 117 U/L   AST 169 (H) 0 - 37 U/L   ALT 163 (H) 0 - 35 U/L   Total Protein 8.0 6.0 - 8.3 g/dL   Albumin 4.4 3.5 - 5.2 g/dL   GFR 09.60 (L) >45.40 mL/min   Calcium 10.0 8.4 - 10.5 mg/dL  Hemoglobin J8J  Result Value Ref Range   Hgb A1c MFr Bld 6.8 (H) 4.6 - 6.5 %  Microalbumin / creatinine urine ratio  Result Value Ref Range   Microalb, Ur 1.4 0.0 - 1.9 mg/dL   Creatinine,U 191.4 mg/dL   Microalb Creat Ratio 1.3 0.0 - 30.0 mg/g  POCT Urinalysis Dipstick (Automated)  Result Value Ref Range   Color, UA yellow    Clarity, UA clear    Glucose, UA Negative Negative   Bilirubin, UA negative    Ketones, UA negative    Spec Grav, UA 1.010 1.010 - 1.025   Blood, UA negative    pH, UA 7.5 5.0 - 8.0   Protein, UA Negative Negative   Urobilinogen, UA 0.2 0.2 or 1.0 E.U./dL   Nitrite, UA negative    Leukocytes, UA Large (3+) (A) Negative    Last CBC Lab Results  Component Value Date   WBC 5.1 12/19/2022   HGB 12.1 12/19/2022    HCT 36.3 12/19/2022   MCV 96.5 12/19/2022   MCH 32.7 09/11/2022   RDW 13.7 12/19/2022   PLT 300.0 12/19/2022   Last metabolic panel Lab Results  Component Value Date   GLUCOSE 98 12/19/2022   NA 138 12/19/2022   K 4.2 12/19/2022   CL 103 12/19/2022   CO2 29 12/19/2022   BUN 26 (H) 12/19/2022   CREATININE 1.95 (H) 12/19/2022   GFR 25.49 (L) 12/19/2022   CALCIUM 10.0 12/19/2022   PROT 8.0 12/19/2022   ALBUMIN 4.4 12/19/2022   BILITOT 0.5 12/19/2022   ALKPHOS 33 (L) 12/19/2022   AST 169 (H) 12/19/2022   ALT 163 (H) 12/19/2022   ANIONGAP 5 09/11/2022   Last lipids Lab Results  Component Value Date   CHOL 216 (H) 12/19/2022   HDL 34.30 (L) 12/19/2022   LDLCALC 140 (H) 12/19/2022   LDLDIRECT 137.0 03/30/2022   TRIG 208.0 (H) 12/19/2022   CHOLHDL 6 12/19/2022   Last hemoglobin A1c Lab Results  Component Value Date   HGBA1C 6.8 (H) 12/19/2022   Last thyroid functions Lab Results  Component Value Date   TSH 1.05 05/26/2013   Last vitamin D Lab Results  Component Value Date   VD25OH  76.53 03/08/2020   Last vitamin B12 and Folate No results found for: "VITAMINB12", "FOLATE"    The 10-year ASCVD risk score (Arnett DK, et al., 2019) is: 28.1%    Assessment & Plan:   Problem List Items Addressed This Visit       Unprioritized   Hyperlipidemia LDL goal <70   Relevant Medications   olmesartan (BENICAR) 20 MG tablet   amLODipine (NORVASC) 5 MG tablet   atorvastatin (LIPITOR) 80 MG tablet   chlorthalidone (HYGROTON) 25 MG tablet   fenofibrate (TRICOR) 48 MG tablet   metoprolol tartrate (LOPRESSOR) 25 MG tablet   Other Relevant Orders   Lipid panel (Completed)   Comprehensive metabolic panel (Completed)   Essential hypertension   Relevant Medications   olmesartan (BENICAR) 20 MG tablet   amLODipine (NORVASC) 5 MG tablet   atorvastatin (LIPITOR) 80 MG tablet   chlorthalidone (HYGROTON) 25 MG tablet   fenofibrate (TRICOR) 48 MG tablet   metoprolol tartrate  (LOPRESSOR) 25 MG tablet   Other Relevant Orders   Comprehensive metabolic panel (Completed)   Hemoglobin A1c (Completed)   Microalbumin / creatinine urine ratio (Completed)   Other Visit Diagnoses     Estrogen deficiency    -  Primary   Relevant Orders   DG Bone Density   Primary hypertension       Relevant Medications   olmesartan (BENICAR) 20 MG tablet   amLODipine (NORVASC) 5 MG tablet   atorvastatin (LIPITOR) 80 MG tablet   chlorthalidone (HYGROTON) 25 MG tablet   fenofibrate (TRICOR) 48 MG tablet   metoprolol tartrate (LOPRESSOR) 25 MG tablet   Other Relevant Orders   Lipid panel (Completed)   CBC with Differential/Platelet (Completed)   Comprehensive metabolic panel (Completed)   Hyperlipidemia, unspecified hyperlipidemia type       Relevant Medications   olmesartan (BENICAR) 20 MG tablet   amLODipine (NORVASC) 5 MG tablet   atorvastatin (LIPITOR) 80 MG tablet   chlorthalidone (HYGROTON) 25 MG tablet   fenofibrate (TRICOR) 48 MG tablet   metoprolol tartrate (LOPRESSOR) 25 MG tablet   Other Relevant Orders   Lipid panel (Completed)   Comprehensive metabolic panel (Completed)   Controlled type 2 diabetes mellitus without complication, without long-term current use of insulin (HCC)       Relevant Medications   olmesartan (BENICAR) 20 MG tablet   atorvastatin (LIPITOR) 80 MG tablet   metFORMIN (GLUCOPHAGE) 500 MG tablet   Other Relevant Orders   Comprehensive metabolic panel (Completed)   Hemoglobin A1c (Completed)   Microalbumin / creatinine urine ratio (Completed)   POCT Urinalysis Dipstick (Automated) (Completed)   Coronary artery disease involving coronary bypass graft of native heart with unstable angina pectoris (HCC)       Relevant Medications   olmesartan (BENICAR) 20 MG tablet   amLODipine (NORVASC) 5 MG tablet   atorvastatin (LIPITOR) 80 MG tablet   chlorthalidone (HYGROTON) 25 MG tablet   fenofibrate (TRICOR) 48 MG tablet   metoprolol tartrate  (LOPRESSOR) 25 MG tablet   Leukocytes in urine       Relevant Orders   Urine Culture (Completed)     Assessment and Plan    Urinary Symptoms   She reports urinary problems for the past three days. Urine will be checked for protein.  Insomnia   She has difficulty falling and staying asleep. Previous medication caused bilateral foot swelling. We will start Seroquel at a low dose at nighttime and increase the dose if necessary.  Type 2  Diabetes Mellitus   She requests a refill of metformin. Her blood sugar levels are well controlled with current medication and diet. We will refill metformin.  Chronic Kidney Disease   She inquires about recent kidney test results, which show slight elevation but are not concerning. We will monitor kidney function.  General Health Maintenance   She has not had a recent eye exam or mammogram and expresses fear of eye surgery, preferring to continue with vitamins. She declines a mammogram. We will encourage an eye exam and respect her decision to decline a mammogram.  Follow-up   We will schedule a follow-up appointment in six months.        No follow-ups on file.    Donato Schultz, DO

## 2022-12-20 LAB — COMPREHENSIVE METABOLIC PANEL
ALT: 163 U/L — ABNORMAL HIGH (ref 0–35)
AST: 169 U/L — ABNORMAL HIGH (ref 0–37)
Albumin: 4.4 g/dL (ref 3.5–5.2)
Alkaline Phosphatase: 33 U/L — ABNORMAL LOW (ref 39–117)
BUN: 26 mg/dL — ABNORMAL HIGH (ref 6–23)
CO2: 29 meq/L (ref 19–32)
Calcium: 10 mg/dL (ref 8.4–10.5)
Chloride: 103 meq/L (ref 96–112)
Creatinine, Ser: 1.95 mg/dL — ABNORMAL HIGH (ref 0.40–1.20)
GFR: 25.49 mL/min — ABNORMAL LOW (ref >60.00)
Glucose, Bld: 98 mg/dL (ref 70–99)
Potassium: 4.2 meq/L (ref 3.5–5.1)
Sodium: 138 meq/L (ref 135–145)
Total Bilirubin: 0.5 mg/dL (ref 0.2–1.2)
Total Protein: 8 g/dL (ref 6.0–8.3)

## 2022-12-20 LAB — CBC WITH DIFFERENTIAL/PLATELET
Basophils Absolute: 0.1 10*3/uL (ref 0.0–0.1)
Basophils Relative: 1.7 % (ref 0.0–3.0)
Eosinophils Absolute: 0.1 10*3/uL (ref 0.0–0.7)
Eosinophils Relative: 1.5 % (ref 0.0–5.0)
HCT: 36.3 % (ref 36.0–46.0)
Hemoglobin: 12.1 g/dL (ref 12.0–15.0)
Lymphocytes Relative: 31.2 % (ref 12.0–46.0)
Lymphs Abs: 1.6 10*3/uL (ref 0.7–4.0)
MCHC: 33.3 g/dL (ref 30.0–36.0)
MCV: 96.5 fL (ref 78.0–100.0)
Monocytes Absolute: 0.7 10*3/uL (ref 0.1–1.0)
Monocytes Relative: 13.3 % — ABNORMAL HIGH (ref 3.0–12.0)
Neutro Abs: 2.7 10*3/uL (ref 1.4–7.7)
Neutrophils Relative %: 52.3 % (ref 43.0–77.0)
Platelets: 300 10*3/uL (ref 150.0–400.0)
RBC: 3.77 Mil/uL — ABNORMAL LOW (ref 3.87–5.11)
RDW: 13.7 % (ref 11.5–15.5)
WBC: 5.1 10*3/uL (ref 4.0–10.5)

## 2022-12-20 LAB — URINE CULTURE
MICRO NUMBER:: 15802273
Result:: NO GROWTH
SPECIMEN QUALITY:: ADEQUATE

## 2022-12-20 LAB — LIPID PANEL
Cholesterol: 216 mg/dL — ABNORMAL HIGH (ref 0–200)
HDL: 34.3 mg/dL — ABNORMAL LOW (ref >39.00)
LDL Cholesterol: 140 mg/dL — ABNORMAL HIGH (ref 0–99)
NonHDL: 181.57
Total CHOL/HDL Ratio: 6
Triglycerides: 208 mg/dL — ABNORMAL HIGH (ref 0.0–149.0)
VLDL: 41.6 mg/dL — ABNORMAL HIGH (ref 0.0–40.0)

## 2022-12-20 LAB — MICROALBUMIN / CREATININE URINE RATIO
Creatinine,U: 104.2 mg/dL
Microalb Creat Ratio: 1.3 mg/g (ref 0.0–30.0)
Microalb, Ur: 1.4 mg/dL (ref 0.0–1.9)

## 2022-12-20 LAB — HEMOGLOBIN A1C: Hgb A1c MFr Bld: 6.8 % — ABNORMAL HIGH (ref 4.6–6.5)

## 2022-12-21 ENCOUNTER — Other Ambulatory Visit: Payer: Self-pay | Admitting: Family Medicine

## 2022-12-21 ENCOUNTER — Encounter: Payer: Self-pay | Admitting: Family Medicine

## 2022-12-21 ENCOUNTER — Ambulatory Visit: Payer: 59 | Admitting: Family Medicine

## 2022-12-21 DIAGNOSIS — R748 Abnormal levels of other serum enzymes: Secondary | ICD-10-CM

## 2022-12-24 ENCOUNTER — Ambulatory Visit (HOSPITAL_BASED_OUTPATIENT_CLINIC_OR_DEPARTMENT_OTHER)
Admission: RE | Admit: 2022-12-24 | Discharge: 2022-12-24 | Disposition: A | Discharge status: Home / Self Care | Payer: 59 | Source: Ambulatory Visit | Attending: Family Medicine | Admitting: Family Medicine

## 2022-12-24 DIAGNOSIS — K838 Other specified diseases of biliary tract: Secondary | ICD-10-CM | POA: Diagnosis not present

## 2022-12-24 DIAGNOSIS — R748 Abnormal levels of other serum enzymes: Secondary | ICD-10-CM | POA: Insufficient documentation

## 2022-12-24 DIAGNOSIS — R7989 Other specified abnormal findings of blood chemistry: Secondary | ICD-10-CM | POA: Diagnosis not present

## 2022-12-24 DIAGNOSIS — K828 Other specified diseases of gallbladder: Secondary | ICD-10-CM | POA: Diagnosis not present

## 2022-12-24 DIAGNOSIS — K8020 Calculus of gallbladder without cholecystitis without obstruction: Secondary | ICD-10-CM | POA: Diagnosis not present

## 2023-05-17 ENCOUNTER — Ambulatory Visit: Payer: Self-pay

## 2023-05-17 NOTE — Telephone Encounter (Signed)
 Chief Complaint: low heart rate Symptoms: low rate Frequency: x today Pertinent Negatives: Patient denies symptoms Disposition: [] ED /[] Urgent Care (no appt availability in office) / [x] Appointment(In office/virtual)/ []  [facility]/ [] Home Care/ [] Refused Recommended Disposition /[] [facility]/ [x]  Follow-up with PCP Additional Notes: pts home health NP states that patient heart rate is currently 50 and 52 but has gotten down to 48 and states that patient take Metoprolol  twice a day and would like pt's medications adjusted. States pt is not symptomatic.  Scheduled appt for next week, but please reach out to pt to discuss medication and if she needs to decrease.   Copied from CRM [id number]. Topic: Clinical - Red Word Triage >> May 17, 2023  2:38 PM Felizardo Hotter wrote: Red Word that prompted transfer to Nurse Triage: Pt 's home health with [facility] per Retha Cast ph: [phone] stated pt heart rate 48. Reason for Disposition  Heart rhythm alert (e.g., "you have irregular heartbeat") from personal wearable device (e.g., Apple Watch)  Answer Assessment - Initial Assessment Questions 1. DESCRIPTION: "Please describe your heart rate or heartbeat that you are having" (e.g., fast/slow, regular/irregular, skipped or extra beats, "palpitations")     Slow  2. ONSET: "When did it start?" (Minutes, hours or days)      unknown 3. DURATION: "How long does it last" (e.g., seconds, minutes, hours)     hours  6. HEART RATE: "Can you tell me your heart rate?" "How many beats in 15 seconds?"  (Note: not all patients can do this)       50 7. RECURRENT SYMPTOM: "Have you ever had this before?" If Yes, ask: "When was the last time?" and "What happened that time?"      no 8. CAUSE: "What do you think is causing the palpitations?"     no 9. CARDIAC HISTORY: "Do you have any history of heart disease?" (e.g., heart attack, angina, bypass surgery, angioplasty, arrhythmia)       10.  OTHER SYMPTOMS: "Do you have any other symptoms?" (e.g., dizziness, chest pain, sweating, difficulty breathing)       no  Protocols used: Heart Rate and Heartbeat Questions-A-AH

## 2023-05-18 NOTE — Telephone Encounter (Signed)
 Attempted to call patient with Interpreter services. No answer. VM not set up. Appt already set up for next week

## 2023-05-22 ENCOUNTER — Encounter: Payer: Self-pay | Admitting: Family Medicine

## 2023-05-22 ENCOUNTER — Ambulatory Visit: Admitting: Family Medicine

## 2023-05-22 VITALS — BP 120/70 | HR 56 | Temp 97.9°F | Resp 16 | Ht 60.0 in | Wt 119.8 lb

## 2023-05-22 DIAGNOSIS — E1165 Type 2 diabetes mellitus with hyperglycemia: Secondary | ICD-10-CM

## 2023-05-22 DIAGNOSIS — I10 Essential (primary) hypertension: Secondary | ICD-10-CM | POA: Diagnosis not present

## 2023-05-22 DIAGNOSIS — E785 Hyperlipidemia, unspecified: Secondary | ICD-10-CM | POA: Diagnosis not present

## 2023-05-22 DIAGNOSIS — Z7984 Long term (current) use of oral hypoglycemic drugs: Secondary | ICD-10-CM | POA: Diagnosis not present

## 2023-05-22 DIAGNOSIS — R748 Abnormal levels of other serum enzymes: Secondary | ICD-10-CM | POA: Diagnosis not present

## 2023-05-22 DIAGNOSIS — I25700 Atherosclerosis of coronary artery bypass graft(s), unspecified, with unstable angina pectoris: Secondary | ICD-10-CM | POA: Diagnosis not present

## 2023-05-22 DIAGNOSIS — B1910 Unspecified viral hepatitis B without hepatic coma: Secondary | ICD-10-CM

## 2023-05-22 DIAGNOSIS — E119 Type 2 diabetes mellitus without complications: Secondary | ICD-10-CM

## 2023-05-22 DIAGNOSIS — E1169 Type 2 diabetes mellitus with other specified complication: Secondary | ICD-10-CM

## 2023-05-22 LAB — CBC WITH DIFFERENTIAL/PLATELET
Basophils Absolute: 0 10*3/uL (ref 0.0–0.1)
Basophils Relative: 1 % (ref 0.0–3.0)
Eosinophils Absolute: 0.1 10*3/uL (ref 0.0–0.7)
Eosinophils Relative: 2.2 % (ref 0.0–5.0)
HCT: 33.2 % — ABNORMAL LOW (ref 36.0–46.0)
Hemoglobin: 11.1 g/dL — ABNORMAL LOW (ref 12.0–15.0)
Lymphocytes Relative: 35.4 % (ref 12.0–46.0)
Lymphs Abs: 1.6 10*3/uL (ref 0.7–4.0)
MCHC: 33.4 g/dL (ref 30.0–36.0)
MCV: 98.3 fl (ref 78.0–100.0)
Monocytes Absolute: 0.3 10*3/uL (ref 0.1–1.0)
Monocytes Relative: 6.2 % (ref 3.0–12.0)
Neutro Abs: 2.5 10*3/uL (ref 1.4–7.7)
Neutrophils Relative %: 55.2 % (ref 43.0–77.0)
Platelets: 275 10*3/uL (ref 150.0–400.0)
RBC: 3.38 Mil/uL — ABNORMAL LOW (ref 3.87–5.11)
RDW: 13.4 % (ref 11.5–15.5)
WBC: 4.5 10*3/uL (ref 4.0–10.5)

## 2023-05-22 LAB — COMPREHENSIVE METABOLIC PANEL WITH GFR
ALT: 15 U/L (ref 0–35)
AST: 22 U/L (ref 0–37)
Albumin: 4.4 g/dL (ref 3.5–5.2)
Alkaline Phosphatase: 29 U/L — ABNORMAL LOW (ref 39–117)
BUN: 19 mg/dL (ref 6–23)
CO2: 27 meq/L (ref 19–32)
Calcium: 9.3 mg/dL (ref 8.4–10.5)
Chloride: 105 meq/L (ref 96–112)
Creatinine, Ser: 1.67 mg/dL — ABNORMAL HIGH (ref 0.40–1.20)
GFR: 30.61 mL/min — ABNORMAL LOW (ref >60.00)
Glucose, Bld: 100 mg/dL — ABNORMAL HIGH (ref 70–99)
Potassium: 4.9 meq/L (ref 3.5–5.1)
Sodium: 139 meq/L (ref 135–145)
Total Bilirubin: 0.5 mg/dL (ref 0.2–1.2)
Total Protein: 7.2 g/dL (ref 6.0–8.3)

## 2023-05-22 LAB — LIPID PANEL
Cholesterol: 216 mg/dL — ABNORMAL HIGH (ref 0–200)
HDL: 39.1 mg/dL (ref >39.00)
LDL Cholesterol: 137 mg/dL — ABNORMAL HIGH (ref 0–99)
NonHDL: 176.65
Total CHOL/HDL Ratio: 6
Triglycerides: 199 mg/dL — ABNORMAL HIGH (ref 0.0–149.0)
VLDL: 39.8 mg/dL (ref 0.0–40.0)

## 2023-05-22 LAB — MICROALBUMIN / CREATININE URINE RATIO
Creatinine,U: 148.8 mg/dL
Microalb Creat Ratio: 9.4 mg/g (ref 0.0–30.0)
Microalb, Ur: 1.4 mg/dL (ref 0.0–1.9)

## 2023-05-22 LAB — HEMOGLOBIN A1C: Hgb A1c MFr Bld: 6.7 % — ABNORMAL HIGH (ref 4.6–6.5)

## 2023-05-22 MED ORDER — METFORMIN HCL 500 MG PO TABS: ORAL_TABLET | ORAL | 3 refills | Status: DC

## 2023-05-22 MED ORDER — ATORVASTATIN CALCIUM 80 MG PO TABS: 80.0000 mg | ORAL_TABLET | Freq: Every day | ORAL | 1 refills | Status: DC

## 2023-05-22 MED ORDER — AMLODIPINE BESYLATE 5 MG PO TABS
5.0000 mg | ORAL_TABLET | Freq: Every day | ORAL | 1 refills | Status: DC
Start: 2023-05-22 — End: 2023-08-25

## 2023-05-22 MED ORDER — CHLORTHALIDONE 25 MG PO TABS: 25.0000 mg | ORAL_TABLET | Freq: Every day | ORAL | 1 refills | Status: AC

## 2023-05-22 MED ORDER — FENOFIBRATE 48 MG PO TABS: 48.0000 mg | ORAL_TABLET | Freq: Every day | ORAL | 1 refills | Status: DC

## 2023-05-22 MED ORDER — METOPROLOL TARTRATE 25 MG PO TABS: 25.0000 mg | ORAL_TABLET | Freq: Two times a day (BID) | ORAL | 1 refills | Status: DC

## 2023-05-22 MED ORDER — OLMESARTAN MEDOXOMIL 20 MG PO TABS: 20.0000 mg | ORAL_TABLET | Freq: Every day | ORAL | 1 refills | Status: DC

## 2023-05-22 NOTE — Assessment & Plan Note (Signed)
 Encourage heart healthy diet such as MIND or DASH diet, increase exercise, avoid trans fats, simple carbohydrates and processed foods, consider a krill or fish or flaxseed oil cap daily.

## 2023-05-22 NOTE — Progress Notes (Signed)
 Established Patient Office Visit  Subjective   Patient ID: Dominique Hayes, female    DOB: 07-17-1950  Age: 73 y.o. MRN: [id number]  Chief Complaint  Patient presents with   Medication Management    HPI Discussed the use of AI scribe software for clinical note transcription with the patient, who gave verbal consent to proceed.  History of Present Illness Dominique Hayes is a 73 year old female with hypertension and diabetes who presents with concerns about her heart rate and liver enzymes. She is accompanied by her husband.  During a recent home visit by [employer], her heart rate was noted to be very slow at 56 beats per minute. She feels okay and does not experience any symptoms such as dizziness or lightheadedness. She is currently taking metoprolol  once daily, although it was prescribed twice daily, due to concerns about her heart rate dropping too low.  She recalls a previous issue with elevated liver enzymes, for which a referral to a liver specialist was made. However, there was difficulty in communication, and she has not yet seen the specialist. Her liver enzyme levels are to be rechecked today.  Her blood sugar levels have been stable, ranging from 99 to 110 mg/dL. She monitors her blood sugar regularly using a blue-colored glucose meter, possibly a Rely On brand, and purchases strips from [place].  She has a history of cataracts diagnosed in January of the previous year, and surgery was recommended. She fears the surgery but acknowledges her vision is worsening. She has not yet scheduled the surgery but plans to follow up with her eye doctor.  Her blood pressure is well-controlled with her current medication regimen. No dizziness or lightheadedness, and her feet are normal.   Patient Active Problem List   Diagnosis Date Noted   Hypoglycemia 09/10/2022   Hypoglycemia due to type 2 diabetes mellitus (HCC) 09/09/2022   Anxiety and depression 09/09/2022   Uncontrolled type 2  diabetes mellitus with hyperglycemia (HCC) 10/04/2020   Depression, major, single episode, severe (HCC) 04/23/2017   Hyperlipidemia associated with type 2 diabetes mellitus (HCC) 03/12/2017   Anxiety 03/12/2017   Cold sore 09/12/2016   DM (diabetes mellitus) type II uncontrolled, periph vascular disorder 04/03/2016   Hyperlipidemia LDL goal <70 04/03/2016   Mild diastolic dysfunction 05/26/2013   DM (diabetes mellitus) type II uncontrolled with eye manifestation 10/18/2012   Hepatitis B 10/18/2012   Essential hypertension 10/13/2012   Nonspecific abnormal electrocardiogram (ECG) (EKG) 10/13/2012   Past Medical History:  Diagnosis Date   DM (diabetes mellitus) (HCC)    Hepatitis B    Hyperlipidemia    Hypertension    Mild diastolic dysfunction    Past Surgical History:  Procedure Laterality Date   ABDOMINAL HYSTERECTOMY     TAH-- complications from D &C   Social History   Tobacco Use   Smoking status: Never   Smokeless tobacco: Never  Substance Use Topics   Alcohol use: No    Alcohol/week: 0.0 standard drinks of alcohol   Drug use: No   Social History   Socioeconomic History   Marital status: Married    Spouse name: Not on file   Number of children: 1   Years of education: Not on file   Highest education level: Not on file  Occupational History   Occupation: nail tech  Tobacco Use   Smoking status: Never   Smokeless tobacco: Never  Substance and Sexual Activity   Alcohol use: No  Alcohol/week: 0.0 standard drinks of alcohol   Drug use: No   Sexual activity: Yes  Other Topics Concern   Not on file  Social History Narrative   Marital status: married      Employment: employed; p/t Chief Strategy Officer   Exercise--- treadmill everday for 30 min   Social Drivers of Corporate investment banker Strain: Not on file  Food Insecurity: No Food Insecurity (09/13/2022)   Hunger Vital Sign    Worried About Running Out of Food in the Last Year: Never true    Ran Out of Food  in the Last Year: Never true  Transportation Needs: No Transportation Needs (09/13/2022)   PRAPARE - Administrator, Civil Service (Medical): No    Lack of Transportation (Non-Medical): No  Physical Activity: Not on file  Stress: Not on file  Social Connections: Not on file  Intimate Partner Violence: Not At Risk (09/09/2022)   Humiliation, Afraid, Rape, and Kick questionnaire    Fear of Current or Ex-Partner: No    Emotionally Abused: No    Physically Abused: No    Sexually Abused: No   Family Status  Relation Name Status   Mother  Alive   Father  Deceased at age 72       htn--- died in sleep   Sister  Alive   Sister  Alive   Brother  Alive   Brother  Alive   Brother  Alive   Brother  Alive   Brother  Alive  No partnership data on file   Family History  Problem Relation Age of Onset   Hypertension Mother    Hypertension Father    Hypertension Sister    Hypertension Sister    No Known Allergies    Review of Systems  Constitutional:  Negative for chills, fever and malaise/fatigue.  HENT:  Negative for congestion and hearing loss.   Eyes:  Negative for blurred vision and discharge.  Respiratory:  Negative for cough, sputum production and shortness of breath.   Cardiovascular:  Negative for chest pain, palpitations and leg swelling.  Gastrointestinal:  Negative for abdominal pain, blood in stool, constipation, diarrhea, heartburn, nausea and vomiting.  Genitourinary:  Negative for dysuria, frequency, hematuria and urgency.  Musculoskeletal:  Negative for back pain, falls and myalgias.  Skin:  Negative for rash.  Neurological:  Negative for dizziness, sensory change, loss of consciousness, weakness and headaches.  Endo/Heme/Allergies:  Negative for environmental allergies. Does not bruise/bleed easily.  Psychiatric/Behavioral:  Negative for depression and suicidal ideas. The patient is not nervous/anxious and does not have insomnia.       Objective:      BP 120/70 (BP Location: Left Arm, Patient Position: Sitting, Cuff Size: Normal)   Pulse (!) 56   Temp 97.9 F (36.6 C) (Oral)   Resp 16   Ht 5' (1.524 m)   Wt 119 lb 12.8 oz (54.3 kg)   SpO2 100%   BMI 23.40 kg/m  BP Readings from Last 3 Encounters:  05/22/23 120/70  12/19/22 130/70  09/19/22 120/80   Wt Readings from Last 3 Encounters:  05/22/23 119 lb 12.8 oz (54.3 kg)  12/19/22 121 lb 6.4 oz (55.1 kg)  09/19/22 119 lb (54 kg)   SpO2 Readings from Last 3 Encounters:  05/22/23 100%  12/19/22 98%  09/19/22 100%      Physical Exam Vitals and nursing note reviewed.  Constitutional:      General: She is not in acute distress.  Appearance: Normal appearance. She is well-developed.  HENT:     Head: Normocephalic and atraumatic.  Eyes:     General: No scleral icterus.       Right eye: No discharge.        Left eye: No discharge.  Cardiovascular:     Rate and Rhythm: Normal rate and regular rhythm.     Heart sounds: No murmur heard. Pulmonary:     Effort: Pulmonary effort is normal. No respiratory distress.     Breath sounds: Normal breath sounds.  Musculoskeletal:        General: Normal range of motion.     Cervical back: Normal range of motion and neck supple.     Right lower leg: No edema.     Left lower leg: No edema.  Skin:    General: Skin is warm and dry.  Neurological:     Mental Status: She is alert and oriented to person, place, and time.  Psychiatric:        Mood and Affect: Mood normal.        Behavior: Behavior normal.        Thought Content: Thought content normal.        Judgment: Judgment normal.      No results found for any visits on 05/22/23.  Last CBC Lab Results  Component Value Date   WBC 5.1 12/19/2022   HGB 12.1 12/19/2022   HCT 36.3 12/19/2022   MCV 96.5 12/19/2022   MCH 32.7 09/11/2022   RDW 13.7 12/19/2022   PLT 300.0 12/19/2022   Last metabolic panel Lab Results  Component Value Date   GLUCOSE 98 12/19/2022   NA  138 12/19/2022   K 4.2 12/19/2022   CL 103 12/19/2022   CO2 29 12/19/2022   BUN 26 (H) 12/19/2022   CREATININE 1.95 (H) 12/19/2022   GFR 25.49 (L) 12/19/2022   CALCIUM  10.0 12/19/2022   PROT 8.0 12/19/2022   ALBUMIN 4.4 12/19/2022   BILITOT 0.5 12/19/2022   ALKPHOS 33 (L) 12/19/2022   AST 169 (H) 12/19/2022   ALT 163 (H) 12/19/2022   ANIONGAP 5 09/11/2022   Last lipids Lab Results  Component Value Date   CHOL 216 (H) 12/19/2022   HDL 34.30 (L) 12/19/2022   LDLCALC 140 (H) 12/19/2022   LDLDIRECT 137.0 03/30/2022   TRIG 208.0 (H) 12/19/2022   CHOLHDL 6 12/19/2022   Last hemoglobin A1c Lab Results  Component Value Date   HGBA1C 6.8 (H) 12/19/2022   Last thyroid functions Lab Results  Component Value Date   TSH 1.05 05/26/2013   Last vitamin D  Lab Results  Component Value Date   VD25OH 76.53 03/08/2020   Last vitamin B12 and Folate No results found for: "VITAMINB12", "FOLATE"    The 10-year ASCVD risk score (Arnett DK, et al., 2019) is: 24.5%    Assessment & Plan:   Problem List Items Addressed This Visit       Unprioritized   Hepatitis B   Relevant Orders   Ambulatory referral to Gastroenterology   Hyperlipidemia LDL goal <70   Relevant Medications   amLODipine  (NORVASC ) 5 MG tablet   atorvastatin  (LIPITOR) 80 MG tablet   chlorthalidone  (HYGROTON ) 25 MG tablet   fenofibrate  (TRICOR ) 48 MG tablet   metoprolol  tartrate (LOPRESSOR ) 25 MG tablet   olmesartan  (BENICAR ) 20 MG tablet   Uncontrolled type 2 diabetes mellitus with hyperglycemia (HCC)   Relevant Medications   atorvastatin  (LIPITOR) 80 MG tablet   metFORMIN  (  GLUCOPHAGE ) 500 MG tablet   olmesartan  (BENICAR ) 20 MG tablet   Other Relevant Orders   CBC with Differential/Platelet   Comprehensive metabolic panel with GFR   Hemoglobin A1c   Microalbumin / creatinine urine ratio   Hyperlipidemia associated with type 2 diabetes mellitus (HCC)   Encourage heart healthy diet such as MIND or DASH  diet, increase exercise, avoid trans fats, simple carbohydrates and processed foods, consider a krill or fish or flaxseed oil cap daily.        Relevant Medications   amLODipine  (NORVASC ) 5 MG tablet   atorvastatin  (LIPITOR) 80 MG tablet   chlorthalidone  (HYGROTON ) 25 MG tablet   fenofibrate  (TRICOR ) 48 MG tablet   metFORMIN  (GLUCOPHAGE ) 500 MG tablet   metoprolol  tartrate (LOPRESSOR ) 25 MG tablet   olmesartan  (BENICAR ) 20 MG tablet   Other Relevant Orders   Lipid panel   Comprehensive metabolic panel with GFR   Essential hypertension   Well controlled, no changes to meds. Encouraged heart healthy diet such as the DASH diet and exercise as tolerated.        Relevant Medications   amLODipine  (NORVASC ) 5 MG tablet   atorvastatin  (LIPITOR) 80 MG tablet   chlorthalidone  (HYGROTON ) 25 MG tablet   fenofibrate  (TRICOR ) 48 MG tablet   metoprolol  tartrate (LOPRESSOR ) 25 MG tablet   olmesartan  (BENICAR ) 20 MG tablet   Other Relevant Orders   Lipid panel   CBC with Differential/Platelet   Comprehensive metabolic panel with GFR   Microalbumin / creatinine urine ratio   Other Visit Diagnoses       Primary hypertension    -  Primary   Relevant Medications   amLODipine  (NORVASC ) 5 MG tablet   atorvastatin  (LIPITOR) 80 MG tablet   chlorthalidone  (HYGROTON ) 25 MG tablet   fenofibrate  (TRICOR ) 48 MG tablet   metoprolol  tartrate (LOPRESSOR ) 25 MG tablet   olmesartan  (BENICAR ) 20 MG tablet   Other Relevant Orders   Lipid panel   CBC with Differential/Platelet   Comprehensive metabolic panel with GFR   Hemoglobin A1c   Microalbumin / creatinine urine ratio     Coronary artery disease involving coronary bypass graft of native heart with unstable angina pectoris (HCC)       Relevant Medications   amLODipine  (NORVASC ) 5 MG tablet   atorvastatin  (LIPITOR) 80 MG tablet   chlorthalidone  (HYGROTON ) 25 MG tablet   fenofibrate  (TRICOR ) 48 MG tablet   metoprolol  tartrate (LOPRESSOR ) 25 MG  tablet   olmesartan  (BENICAR ) 20 MG tablet   Other Relevant Orders   Lipid panel   CBC with Differential/Platelet   Comprehensive metabolic panel with GFR     Elevated liver enzymes       Relevant Orders   Hepatitis, Acute   Ambulatory referral to Gastroenterology     Hyperlipidemia, unspecified hyperlipidemia type       Relevant Medications   amLODipine  (NORVASC ) 5 MG tablet   atorvastatin  (LIPITOR) 80 MG tablet   chlorthalidone  (HYGROTON ) 25 MG tablet   fenofibrate  (TRICOR ) 48 MG tablet   metoprolol  tartrate (LOPRESSOR ) 25 MG tablet   olmesartan  (BENICAR ) 20 MG tablet     Controlled type 2 diabetes mellitus without complication, without long-term current use of insulin  (HCC)       Relevant Medications   atorvastatin  (LIPITOR) 80 MG tablet   metFORMIN  (GLUCOPHAGE ) 500 MG tablet   olmesartan  (BENICAR ) 20 MG tablet     Assessment and Plan Assessment & Plan Hypertension   Blood pressure is well-controlled  with metoprolol , taken once daily due to bradycardia concerns. Her pulse is 56 bpm, which is acceptable if she feels well. Instruct to take metoprolol  half tablet twice daily for 24-hour coverage.  Bradycardia   Heart rate is 56 bpm, likely related to metoprolol  use, and is acceptable as long as she feels well.  Type 2 Diabetes Mellitus   Blood sugar levels are well-controlled, ranging from 99 to 110 mg/dL. She manages her diabetes effectively with current medication and monitoring. Ensure refills for diabetes medication and confirm she has enough strips and lancets for glucose monitoring.  Elevated liver enzymes   Previous blood work showed elevated liver enzymes. She has not yet seen the liver specialist despite a referral, and contact attempts were unsuccessful. Recheck liver enzymes and re-refer to liver specialist.  Cataracts   Cataracts are diagnosed with worsening vision. She is hesitant about the cost of multifocal lens surgery. Discussed standard surgery as a more  affordable alternative, which may require glasses post-operatively. Encourage follow-up with eye doctor for potential cataract surgery referral.    No follow-ups on file.    Lakina Mcintire R Lowne Chase, DO

## 2023-05-22 NOTE — Assessment & Plan Note (Signed)
 Hgba1c to be checked , minimize simple carbs. Increase exercise as tolerated. Continue current meds

## 2023-05-22 NOTE — Assessment & Plan Note (Signed)
 Well controlled, no changes to meds. Encouraged heart healthy diet such as the DASH diet and exercise as tolerated.

## 2023-05-23 LAB — HEPATITIS PANEL, ACUTE
Hep A IgM: NONREACTIVE
Hep B C IgM: NONREACTIVE
Hepatitis B Surface Ag: REACTIVE — AB
Hepatitis C Ab: NONREACTIVE

## 2023-05-23 NOTE — Addendum Note (Signed)
 Addended by: Roel Clarity R on: 05/23/2023 01:40 PM   Modules accepted: Orders

## 2023-05-29 ENCOUNTER — Other Ambulatory Visit: Payer: Self-pay | Admitting: Family Medicine

## 2023-05-29 ENCOUNTER — Ambulatory Visit: Payer: Self-pay | Admitting: Family Medicine

## 2023-05-29 DIAGNOSIS — E785 Hyperlipidemia, unspecified: Secondary | ICD-10-CM

## 2023-05-29 MED ORDER — EZETIMIBE 10 MG PO TABS
10.0000 mg | ORAL_TABLET | Freq: Every day | ORAL | 3 refills | Status: DC
Start: 2023-05-29 — End: 2023-08-25

## 2023-06-19 ENCOUNTER — Ambulatory Visit: Payer: 59 | Admitting: Family Medicine

## 2023-08-16 ENCOUNTER — Other Ambulatory Visit: Payer: 59

## 2023-08-21 ENCOUNTER — Encounter (HOSPITAL_BASED_OUTPATIENT_CLINIC_OR_DEPARTMENT_OTHER): Payer: Self-pay | Admitting: Emergency Medicine

## 2023-08-21 ENCOUNTER — Emergency Department (HOSPITAL_BASED_OUTPATIENT_CLINIC_OR_DEPARTMENT_OTHER)

## 2023-08-21 ENCOUNTER — Inpatient Hospital Stay (HOSPITAL_BASED_OUTPATIENT_CLINIC_OR_DEPARTMENT_OTHER)
Admission: EM | Admit: 2023-08-21 | Discharge: 2023-08-25 | DRG: 641 | Disposition: A | Discharge status: Home / Self Care | Attending: Internal Medicine | Admitting: Internal Medicine

## 2023-08-21 ENCOUNTER — Inpatient Hospital Stay (HOSPITAL_COMMUNITY)

## 2023-08-21 ENCOUNTER — Other Ambulatory Visit: Payer: Self-pay

## 2023-08-21 ENCOUNTER — Ambulatory Visit
Admission: EM | Admit: 2023-08-21 | Discharge: 2023-08-21 | Disposition: A | Discharge status: Other Acute Inpatient Hospital | Attending: Family Medicine | Admitting: Family Medicine

## 2023-08-21 DIAGNOSIS — Z7984 Long term (current) use of oral hypoglycemic drugs: Secondary | ICD-10-CM

## 2023-08-21 DIAGNOSIS — E11649 Type 2 diabetes mellitus with hypoglycemia without coma: Secondary | ICD-10-CM | POA: Diagnosis present

## 2023-08-21 DIAGNOSIS — R8271 Bacteriuria: Secondary | ICD-10-CM | POA: Diagnosis present

## 2023-08-21 DIAGNOSIS — A419 Sepsis, unspecified organism: Secondary | ICD-10-CM | POA: Diagnosis not present

## 2023-08-21 DIAGNOSIS — T383X5A Adverse effect of insulin and oral hypoglycemic [antidiabetic] drugs, initial encounter: Secondary | ICD-10-CM | POA: Diagnosis present

## 2023-08-21 DIAGNOSIS — I361 Nonrheumatic tricuspid (valve) insufficiency: Secondary | ICD-10-CM | POA: Diagnosis not present

## 2023-08-21 DIAGNOSIS — K529 Noninfective gastroenteritis and colitis, unspecified: Secondary | ICD-10-CM | POA: Diagnosis not present

## 2023-08-21 DIAGNOSIS — E785 Hyperlipidemia, unspecified: Secondary | ICD-10-CM | POA: Diagnosis present

## 2023-08-21 DIAGNOSIS — E8809 Other disorders of plasma-protein metabolism, not elsewhere classified: Secondary | ICD-10-CM | POA: Diagnosis present

## 2023-08-21 DIAGNOSIS — E1122 Type 2 diabetes mellitus with diabetic chronic kidney disease: Secondary | ICD-10-CM | POA: Diagnosis present

## 2023-08-21 DIAGNOSIS — K838 Other specified diseases of biliary tract: Secondary | ICD-10-CM | POA: Diagnosis not present

## 2023-08-21 DIAGNOSIS — E872 Acidosis, unspecified: Secondary | ICD-10-CM | POA: Diagnosis present

## 2023-08-21 DIAGNOSIS — R82998 Other abnormal findings in urine: Secondary | ICD-10-CM | POA: Diagnosis not present

## 2023-08-21 DIAGNOSIS — R112 Nausea with vomiting, unspecified: Secondary | ICD-10-CM | POA: Diagnosis present

## 2023-08-21 DIAGNOSIS — N1832 Chronic kidney disease, stage 3b: Secondary | ICD-10-CM | POA: Diagnosis present

## 2023-08-21 DIAGNOSIS — R109 Unspecified abdominal pain: Secondary | ICD-10-CM | POA: Diagnosis not present

## 2023-08-21 DIAGNOSIS — R7401 Elevation of levels of liver transaminase levels: Secondary | ICD-10-CM | POA: Diagnosis not present

## 2023-08-21 DIAGNOSIS — I700 Atherosclerosis of aorta: Secondary | ICD-10-CM | POA: Diagnosis not present

## 2023-08-21 DIAGNOSIS — E8721 Acute metabolic acidosis: Secondary | ICD-10-CM

## 2023-08-21 DIAGNOSIS — K8070 Calculus of gallbladder and bile duct without cholecystitis without obstruction: Secondary | ICD-10-CM | POA: Diagnosis present

## 2023-08-21 DIAGNOSIS — I10 Essential (primary) hypertension: Secondary | ICD-10-CM | POA: Diagnosis not present

## 2023-08-21 DIAGNOSIS — R6520 Severe sepsis without septic shock: Principal | ICD-10-CM

## 2023-08-21 DIAGNOSIS — R1084 Generalized abdominal pain: Secondary | ICD-10-CM

## 2023-08-21 DIAGNOSIS — D631 Anemia in chronic kidney disease: Secondary | ICD-10-CM | POA: Diagnosis present

## 2023-08-21 DIAGNOSIS — I129 Hypertensive chronic kidney disease with stage 1 through stage 4 chronic kidney disease, or unspecified chronic kidney disease: Secondary | ICD-10-CM | POA: Diagnosis present

## 2023-08-21 DIAGNOSIS — N179 Acute kidney failure, unspecified: Secondary | ICD-10-CM | POA: Diagnosis present

## 2023-08-21 DIAGNOSIS — E874 Mixed disorder of acid-base balance: Secondary | ICD-10-CM | POA: Diagnosis present

## 2023-08-21 DIAGNOSIS — N3001 Acute cystitis with hematuria: Secondary | ICD-10-CM

## 2023-08-21 DIAGNOSIS — R932 Abnormal findings on diagnostic imaging of liver and biliary tract: Secondary | ICD-10-CM | POA: Diagnosis not present

## 2023-08-21 DIAGNOSIS — E860 Dehydration: Secondary | ICD-10-CM | POA: Diagnosis present

## 2023-08-21 DIAGNOSIS — T458X5A Adverse effect of other primarily systemic and hematological agents, initial encounter: Secondary | ICD-10-CM | POA: Diagnosis not present

## 2023-08-21 DIAGNOSIS — T650X1A Toxic effect of cyanides, accidental (unintentional), initial encounter: Secondary | ICD-10-CM | POA: Diagnosis present

## 2023-08-21 DIAGNOSIS — Z8249 Family history of ischemic heart disease and other diseases of the circulatory system: Secondary | ICD-10-CM | POA: Diagnosis not present

## 2023-08-21 DIAGNOSIS — R000 Tachycardia, unspecified: Secondary | ICD-10-CM | POA: Diagnosis not present

## 2023-08-21 DIAGNOSIS — I517 Cardiomegaly: Secondary | ICD-10-CM | POA: Diagnosis not present

## 2023-08-21 DIAGNOSIS — D649 Anemia, unspecified: Secondary | ICD-10-CM | POA: Diagnosis not present

## 2023-08-21 DIAGNOSIS — R7989 Other specified abnormal findings of blood chemistry: Secondary | ICD-10-CM | POA: Diagnosis not present

## 2023-08-21 DIAGNOSIS — Z90710 Acquired absence of both cervix and uterus: Secondary | ICD-10-CM | POA: Diagnosis not present

## 2023-08-21 DIAGNOSIS — R1111 Vomiting without nausea: Secondary | ICD-10-CM | POA: Diagnosis not present

## 2023-08-21 DIAGNOSIS — R197 Diarrhea, unspecified: Secondary | ICD-10-CM | POA: Diagnosis not present

## 2023-08-21 LAB — COMPREHENSIVE METABOLIC PANEL WITH GFR
ALT: 45 U/L — ABNORMAL HIGH (ref 0–44)
AST: 45 U/L — ABNORMAL HIGH (ref 15–41)
Albumin: 4.8 g/dL (ref 3.5–5.0)
Alkaline Phosphatase: 66 U/L (ref 38–126)
BUN: 23 mg/dL (ref 8–23)
CO2: <7 mmol/L — ABNORMAL LOW (ref 22–32)
Calcium: 10.8 mg/dL — ABNORMAL HIGH (ref 8.9–10.3)
Chloride: 93 mmol/L — ABNORMAL LOW (ref 98–111)
Creatinine, Ser: 1.98 mg/dL — ABNORMAL HIGH (ref 0.44–1.00)
GFR, Estimated: 26 mL/min — ABNORMAL LOW (ref >60)
Glucose, Bld: 172 mg/dL — ABNORMAL HIGH (ref 70–99)
Potassium: 4.4 mmol/L (ref 3.5–5.1)
Sodium: 142 mmol/L (ref 135–145)
Total Bilirubin: 0.4 mg/dL (ref 0.0–1.2)
Total Protein: 8.2 g/dL — ABNORMAL HIGH (ref 6.5–8.1)

## 2023-08-21 LAB — ETHANOL: Alcohol, Ethyl (B): <15 mg/dL (ref <15)

## 2023-08-21 LAB — CBC
HCT: 40.1 % (ref 36.0–46.0)
HCT: 38.9 % (ref 36.0–46.0)
Hemoglobin: 12.4 g/dL (ref 12.0–15.0)
Hemoglobin: 11.5 g/dL — ABNORMAL LOW (ref 12.0–15.0)
MCH: 32 pg (ref 26.0–34.0)
MCH: 32.6 pg (ref 26.0–34.0)
MCHC: 30.9 g/dL (ref 30.0–36.0)
MCHC: 29.6 g/dL — ABNORMAL LOW (ref 30.0–36.0)
MCV: 103.6 fL — ABNORMAL HIGH (ref 80.0–100.0)
MCV: 110.2 fL — ABNORMAL HIGH (ref 80.0–100.0)
Platelets: 362 K/uL (ref 150–400)
Platelets: 356 K/uL (ref 150–400)
RBC: 3.87 MIL/uL (ref 3.87–5.11)
RBC: 3.53 MIL/uL — ABNORMAL LOW (ref 3.87–5.11)
RDW: 13.1 % (ref 11.5–15.5)
RDW: 13.1 % (ref 11.5–15.5)
WBC: 19.8 K/uL — ABNORMAL HIGH (ref 4.0–10.5)
WBC: 30 K/uL — ABNORMAL HIGH (ref 4.0–10.5)
nRBC: 0 % (ref 0.0–0.2)
nRBC: 0 % (ref 0.0–0.2)

## 2023-08-21 LAB — LACTIC ACID, PLASMA
Lactic Acid, Venous: >9 mmol/L (ref 0.5–1.9)
Lactic Acid, Venous: >9 mmol/L (ref 0.5–1.9)
Lactic Acid, Venous: >9 mmol/L (ref 0.5–1.9)

## 2023-08-21 LAB — I-STAT VENOUS BLOOD GAS, ED
Acid-base deficit: 28 mmol/L — ABNORMAL HIGH (ref 0.0–2.0)
Bicarbonate: 3.6 mmol/L — ABNORMAL LOW (ref 20.0–28.0)
Calcium, Ion: 1.12 mmol/L — ABNORMAL LOW (ref 1.15–1.40)
HCT: 34 % — ABNORMAL LOW (ref 36.0–46.0)
Hemoglobin: 11.6 g/dL — ABNORMAL LOW (ref 12.0–15.0)
O2 Saturation: 91 %
Potassium: 4.2 mmol/L (ref 3.5–5.1)
Sodium: 142 mmol/L (ref 135–145)
TCO2: <5 mmol/L — ABNORMAL LOW (ref 22–32)
pCO2, Ven: 19.3 mmHg — CL (ref 44–60)
pH, Ven: 6.878 — CL (ref 7.25–7.43)
pO2, Ven: 100 mmHg — ABNORMAL HIGH (ref 32–45)

## 2023-08-21 LAB — URINALYSIS, ROUTINE W REFLEX MICROSCOPIC
Bilirubin Urine: NEGATIVE
Glucose, UA: NEGATIVE mg/dL
Ketones, ur: 40 mg/dL — AB
Nitrite: NEGATIVE
Protein, ur: 30 mg/dL — AB
Specific Gravity, Urine: 1.02 (ref 1.005–1.030)
pH: 5 (ref 5.0–8.0)

## 2023-08-21 LAB — OSMOLALITY: Osmolality: 326 mosm/kg (ref 275–295)

## 2023-08-21 LAB — BETA-HYDROXYBUTYRIC ACID: Beta-Hydroxybutyric Acid: 3.66 mmol/L — ABNORMAL HIGH (ref 0.05–0.27)

## 2023-08-21 LAB — LIPASE, BLOOD: Lipase: 66 U/L — ABNORMAL HIGH (ref 11–51)

## 2023-08-21 LAB — URINALYSIS, MICROSCOPIC (REFLEX)

## 2023-08-21 LAB — CBG MONITORING, ED: Glucose-Capillary: 164 mg/dL — ABNORMAL HIGH (ref 70–99)

## 2023-08-21 LAB — POC SOFIA SARS ANTIGEN FIA: SARS Coronavirus 2 Ag: NEGATIVE

## 2023-08-21 LAB — GLUCOSE, CAPILLARY: Glucose-Capillary: 45 mg/dL — ABNORMAL LOW (ref 70–99)

## 2023-08-21 MED ORDER — DOCUSATE SODIUM 100 MG PO CAPS: 100.0000 mg | ORAL_CAPSULE | Freq: Two times a day (BID) | ORAL | Status: DC | PRN

## 2023-08-21 MED ORDER — SODIUM BICARBONATE 8.4 % IV SOLN
50.0000 meq | Freq: Once | INTRAVENOUS | Status: AC
  Administered 2023-08-21: 50 meq via INTRAVENOUS
  Filled 2023-08-21: qty 50

## 2023-08-21 MED ORDER — FAMOTIDINE IN NACL 20-0.9 MG/50ML-% IV SOLN
20.0000 mg | Freq: Once | INTRAVENOUS | Status: AC
  Administered 2023-08-21: 20 mg via INTRAVENOUS
  Filled 2023-08-21: qty 50

## 2023-08-21 MED ORDER — SODIUM BICARBONATE 8.4 % IV SOLN
INTRAVENOUS | Status: AC
Start: 2023-08-21 — End: 2023-08-22
  Filled 2023-08-21: qty 50

## 2023-08-21 MED ORDER — THIAMINE HCL 100 MG/ML IJ SOLN
100.0000 mg | Freq: Every day | INTRAMUSCULAR | Status: DC
  Administered 2023-08-21 – 2023-08-23 (×3): 100 mg via INTRAVENOUS
  Filled 2023-08-21 (×3): qty 2

## 2023-08-21 MED ORDER — PIPERACILLIN-TAZOBACTAM IN DEX 2-0.25 GM/50ML IV SOLN
2.2500 g | Freq: Three times a day (TID) | INTRAVENOUS | Status: DC
  Administered 2023-08-21 – 2023-08-22 (×3): 2.25 g via INTRAVENOUS
  Filled 2023-08-21 (×4): qty 50

## 2023-08-21 MED ORDER — LACTATED RINGERS IV SOLN: INTRAVENOUS | Status: AC

## 2023-08-21 MED ORDER — ACETAMINOPHEN 325 MG PO TABS: 650.0000 mg | ORAL_TABLET | ORAL | Status: DC | PRN

## 2023-08-21 MED ORDER — CHLORHEXIDINE GLUCONATE CLOTH 2 % EX PADS
6.0000 | MEDICATED_PAD | Freq: Every day | CUTANEOUS | Status: DC
  Administered 2023-08-21 – 2023-08-23 (×3): 6 via TOPICAL

## 2023-08-21 MED ORDER — PANTOPRAZOLE SODIUM 40 MG IV SOLR
40.0000 mg | INTRAVENOUS | Status: DC
  Administered 2023-08-21: 40 mg via INTRAVENOUS
  Filled 2023-08-21: qty 10

## 2023-08-21 MED ORDER — LACTATED RINGERS IV BOLUS
1000.0000 mL | Freq: Once | INTRAVENOUS | Status: AC
  Administered 2023-08-21: 1000 mL via INTRAVENOUS

## 2023-08-21 MED ORDER — LABETALOL HCL 5 MG/ML IV SOLN: 5.0000 mg | INTRAVENOUS | Status: DC | PRN

## 2023-08-21 MED ORDER — HEPARIN SODIUM (PORCINE) 5000 UNIT/ML IJ SOLN
5000.0000 [IU] | Freq: Three times a day (TID) | INTRAMUSCULAR | Status: DC
  Administered 2023-08-21 – 2023-08-25 (×12): 5000 [IU] via SUBCUTANEOUS
  Filled 2023-08-21 (×12): qty 1

## 2023-08-21 MED ORDER — PIPERACILLIN-TAZOBACTAM 3.375 G IVPB 30 MIN
3.3750 g | Freq: Once | INTRAVENOUS | Status: AC
  Administered 2023-08-21: 3.375 g via INTRAVENOUS
  Filled 2023-08-21: qty 50

## 2023-08-21 MED ORDER — MORPHINE SULFATE (PF) 4 MG/ML IV SOLN
4.0000 mg | Freq: Once | INTRAVENOUS | Status: AC
  Administered 2023-08-21: 4 mg via INTRAVENOUS
  Filled 2023-08-21: qty 1

## 2023-08-21 MED ORDER — SODIUM CHLORIDE 0.9 % IV BOLUS
1000.0000 mL | Freq: Once | INTRAVENOUS | Status: AC
  Administered 2023-08-21: 1000 mL via INTRAVENOUS

## 2023-08-21 MED ORDER — LABETALOL HCL 5 MG/ML IV SOLN
INTRAVENOUS | Status: AC
  Filled 2023-08-21: qty 4

## 2023-08-21 MED ORDER — ONDANSETRON HCL 4 MG/2ML IJ SOLN
4.0000 mg | Freq: Once | INTRAMUSCULAR | Status: AC
  Administered 2023-08-21: 4 mg via INTRAVENOUS
  Filled 2023-08-21: qty 2

## 2023-08-21 MED ORDER — STERILE WATER FOR INJECTION IV SOLN
INTRAVENOUS | Status: DC
  Filled 2023-08-21: qty 150
  Filled 2023-08-21: qty 1000
  Filled 2023-08-21: qty 150

## 2023-08-21 MED ORDER — INSULIN ASPART 100 UNIT/ML IJ SOLN
0.0000 [IU] | INTRAMUSCULAR | Status: DC
  Administered 2023-08-21: 3 [IU] via SUBCUTANEOUS
  Administered 2023-08-21: 2 [IU] via SUBCUTANEOUS
  Administered 2023-08-22 (×5): 1 [IU] via SUBCUTANEOUS
  Administered 2023-08-22 – 2023-08-23 (×2): 2 [IU] via SUBCUTANEOUS
  Administered 2023-08-23 – 2023-08-24 (×2): 1 [IU] via SUBCUTANEOUS

## 2023-08-21 MED ORDER — HYDROXOCOBALAMIN 5 G IV SOLR
5.0000 g | Freq: Once | INTRAVENOUS | Status: AC
  Administered 2023-08-21: 5 g via INTRAVENOUS
  Filled 2023-08-21: qty 200

## 2023-08-21 MED ORDER — SODIUM BICARBONATE 8.4 % IV SOLN
100.0000 meq | Freq: Once | INTRAVENOUS | Status: AC
  Administered 2023-08-21: 100 meq via INTRAVENOUS
  Filled 2023-08-21: qty 50

## 2023-08-21 MED ORDER — POLYETHYLENE GLYCOL 3350 17 G PO PACK
17.0000 g | PACK | Freq: Every day | ORAL | Status: DC | PRN
Start: 2023-08-21 — End: 2023-08-25

## 2023-08-21 MED ORDER — PIPERACILLIN-TAZOBACTAM 3.375 G IVPB
3.3750 g | Freq: Three times a day (TID) | INTRAVENOUS | Status: DC
  Filled 2023-08-21: qty 50

## 2023-08-21 MED ORDER — ONDANSETRON 4 MG PO TBDP
4.0000 mg | ORAL_TABLET | Freq: Once | ORAL | Status: AC
  Administered 2023-08-21: 4 mg via ORAL

## 2023-08-21 MED ORDER — VANCOMYCIN HCL IN DEXTROSE 1-5 GM/200ML-% IV SOLN
1000.0000 mg | Freq: Once | INTRAVENOUS | Status: AC
  Administered 2023-08-21: 1000 mg via INTRAVENOUS
  Filled 2023-08-21: qty 200

## 2023-08-21 NOTE — Progress Notes (Signed)
 Son updated at bedside w/ interpreter.  JD Emilio RIGGERS [facility] 08/21/2023, 7:12 PM  Please see [email or web address] for pager details.  From 7A-7P if no response, please call [phone]. After hours, please call ELink [phone].

## 2023-08-21 NOTE — Progress Notes (Signed)
   PCCM transfer request    Sending physician: [place]- EDP  Sending facility: [facility]  Reason for transfer: sepsis  Brief case summary: Two days of poor PO intake due to uncontrolled n/v/d, abdominal pain. LA> 9 in ED, pH <7.  Given vanc, zosyn , bicarb, 3L IVF in ED.   Imaging- choledocholithiasis, but no cholecystitis, urine not revealing.  Husband worried this is due to yuca cooked in coconut milk-- but was several days ago.   Recommendations made prior to transfer: con't IVF  Transfer accepted: yes    Dominique Hayes 08/21/23 3:30 PM  Pulmonary & Critical Care  For contact information, see Amion. If no response to pager, please call PCCM consult pager. After hours, 7PM- 7AM, please call Elink.

## 2023-08-21 NOTE — ED Notes (Signed)
 Lab aware osmolality

## 2023-08-21 NOTE — Discharge Instructions (Signed)
 Patient taken via EMS to ER per patient and husband request

## 2023-08-21 NOTE — ED Notes (Signed)
I  used interpreter to triage pt

## 2023-08-21 NOTE — ED Notes (Signed)
 Called [facility] for Transport to [facility] @15 :52.  Spoke with [employer].

## 2023-08-21 NOTE — Progress Notes (Signed)
 ED Pharmacy Antibiotic Sign Off An antibiotic consult was received from an ED provider for vancomycin  per pharmacy dosing for sepsis. A chart review was completed to assess appropriateness.   The following one time order(s) were placed:  Vancomycin  1000mg  IV x1  Further antibiotic and/or antibiotic pharmacy consults should be ordered by the admitting provider if indicated.   Thank you for allowing pharmacy to be a part of this patient's care.   Koren LITTIE Or, [facility]  Clinical Pharmacist 08/21/23 3:03 PM

## 2023-08-21 NOTE — ED Triage Notes (Addendum)
 Pt c/o N/V/Dx2d. Pt states can't hold any food or liquids down. Pt vomited 10 times in past 24hrs and had 5 Bms in past 24hrs. Pt has been unable to hold any of her meds down for the past 2 days

## 2023-08-21 NOTE — ED Provider Notes (Signed)
 Signout from Dr. Elnor.  73 year old female diabetic hypertension, [place] speaking here with abdominal pain nausea vomiting diarrhea x 2 days.  Lab work with significantly elevated white count and elevated lactic acid.  She has received IV antibiotics IV fluids IV bicarb.  She is pending x-ray chest and CT abdomen and pelvis.  Will need admission to the hospital. Physical Exam  BP (!) 145/59   Pulse (!) 104   Temp 97.8 F (36.6 C)   Resp 19   Ht 5' (1.524 m)   Wt 60 kg   SpO2 100%   BMI 25.83 kg/m   Physical Exam  Procedures  Procedures  ED Course / MDM   Clinical Course as of 08/21/23 1516  Tue Aug 21, 2023  1219 Interpreter [id number] [SG]    Clinical Course User Index [SG] Elnor Jayson LABOR, DO   Medical Decision Making Amount and/or Complexity of Data Reviewed Labs: ordered. Radiology: ordered.  Risk Prescription drug management. Decision regarding hospitalization.   1520.  CT coming back with cholelithiasis without clear  evidence of cholecystitis.  Have paged intensivist  1540.  Discussed with Dr. Gretta ICU intensivist.  She is excepting the patient for admission to the ICU.  No further recommendations currently.

## 2023-08-21 NOTE — Progress Notes (Addendum)
 Spoke with Ozell Repine MD with [facility] via phone call. Discussed current vital signs, symptoms and current presentation of patient. One dose of hydroxocobalamin  has been given, of note patient became severely tachycardic and flushed towards end of infusion. Labetalol  IV given before my arrival to shift. Patient currently flushed but VSS and patient comfortable. No abdominal symptoms present  Per Beuhler MD,  - please hold further doses of hydroxocobalamin  at this time.  - also states this could possibly be metformin  toxicity.   Poison control team will follow up in a few hours regarding lab results

## 2023-08-21 NOTE — ED Provider Notes (Signed)
 [facility] Provider Note  CSN: [id number] Arrival date & time: 08/21/23 1137  Chief Complaint(s) Emesis and Diarrhea  HPI Dominique Hayes is a 73 y.o. female with past medical history as below, significant for DM, HLD, HTN who presents to the ED with complaint of abdominal pain, nausea vomiting diarrhea  Symptoms ongoing the past 2 days, began having periumbilical abdominal pain, cramping that does not radiate.  No fevers or chills.  Nausea vomiting diarrhea.  Emesis nonbloody nonbilious, no BRBPR or melena.  Attempted home remedies such as ginger and porridge which did not alleviate her symptoms.  Tried Pepto-Bismol as well which made her feel nausea and she vomited.  Prior abdominal surgery includes hysterectomy.  Family is concerned that she was eating some home made Yucca root that could be causing her symptoms.   Past Medical History Past Medical History:  Diagnosis Date   DM (diabetes mellitus) (HCC)    Hepatitis B    Hyperlipidemia    Hypertension    Mild diastolic dysfunction    Patient Active Problem List   Diagnosis Date Noted   Hypoglycemia 09/10/2022   Hypoglycemia due to type 2 diabetes mellitus (HCC) 09/09/2022   Anxiety and depression 09/09/2022   Uncontrolled type 2 diabetes mellitus with hyperglycemia (HCC) 10/04/2020   Depression, major, single episode, severe (HCC) 04/23/2017   Hyperlipidemia associated with type 2 diabetes mellitus (HCC) 03/12/2017   Anxiety 03/12/2017   Cold sore 09/12/2016   DM (diabetes mellitus) type II uncontrolled, periph vascular disorder 04/03/2016   Hyperlipidemia LDL goal <70 04/03/2016   Mild diastolic dysfunction 05/26/2013   DM (diabetes mellitus) type II uncontrolled with eye manifestation 10/18/2012   Hepatitis B 10/18/2012   Essential hypertension 10/13/2012   Nonspecific abnormal electrocardiogram (ECG) (EKG) 10/13/2012   Home Medication(s) Prior to Admission medications    Medication Sig Start Date End Date Taking? Authorizing Provider  amLODipine  (NORVASC ) 5 MG tablet Take 1 tablet (5 mg total) by mouth daily. 05/22/23   Antonio Cyndee Jamee JONELLE, DO  atorvastatin  (LIPITOR) 80 MG tablet Take 1 tablet (80 mg total) by mouth daily. 05/22/23   Antonio Cyndee Jamee R, DO  blood glucose meter kit and supplies KIT Dispense based on patient and insurance preference. Use to test sugar once a day.  E11.51 10/17/17   Antonio Cyndee Jamee R, DO  chlorthalidone  (HYGROTON ) 25 MG tablet Take 1 tablet (25 mg total) by mouth daily. 05/22/23   Antonio Cyndee Jamee JONELLE, DO  ezetimibe  (ZETIA ) 10 MG tablet Take 1 tablet (10 mg total) by mouth daily. 05/29/23   Antonio Cyndee Jamee JONELLE, DO  fenofibrate  (TRICOR ) 48 MG tablet Take 1 tablet (48 mg total) by mouth daily. 05/22/23   Antonio Cyndee Jamee R, DO  glucose blood test strip Use to test sugar once a day.  E11.51 10/17/17   Antonio Cyndee Jamee JONELLE, DO  icosapent  Ethyl (VASCEPA ) 1 g capsule TAKE 2 CAPSULES BY MOUTH 2 TIMES DAILY. Patient not taking: Reported on 09/09/2022 03/30/22   Antonio Cyndee Jamee R, DO  Lancets MISC Use to test sugar once a day.  E11.51 10/17/17   Antonio Cyndee Jamee R, DO  loratadine  (CLARITIN ) 10 MG tablet TAKE 1 TABLET BY MOUTH EVERY DAY Patient not taking: Reported on 09/09/2022 03/10/19   Antonio Cyndee, Yvonne R, DO  MEGARED OMEGA-3 KRILL OIL 500 MG CAPS Take 1 capsule by mouth daily. Patient not taking: Reported on 09/09/2022 10/15/17   Antonio Cyndee,  Jamee SAUNDERS, DO  metFORMIN  (GLUCOPHAGE ) 500 MG tablet 1/2 tab po qd 05/22/23   Antonio Meth, Yvonne R, DO  metoprolol  tartrate (LOPRESSOR ) 25 MG tablet Take 1 tablet (25 mg total) by mouth 2 (two) times daily. 05/22/23   Antonio Meth Jamee R, DO  mometasone  (ELOCON ) 0.1 % cream APPLY TOPICALLY TO AFFECTED AREA EVERY DAY 05/23/19   Antonio Meth, Yvonne R, DO  nitroGLYCERIN  (NITROSTAT ) 0.4 MG SL tablet Place 1 tablet (0.4 mg total) under the tongue every 5 (five) minutes as needed for chest  pain. Patient not taking: Reported on 09/09/2022 01/03/21   Lowne Chase, Yvonne R, DO  nystatin  cream (MYCOSTATIN ) Apply 1 application topically 2 (two) times daily. Patient not taking: Reported on 09/09/2022 11/15/18   Antonio Meth Jamee R, DO  olmesartan  (BENICAR ) 20 MG tablet Take 1 tablet (20 mg total) by mouth daily. 05/22/23   Antonio Meth Jamee SAUNDERS, DO  traZODone  (DESYREL ) 50 MG tablet Take 0.5-1 tablets (25-50 mg total) by mouth at bedtime as needed for sleep. 04/03/22   Antonio Meth Jamee R, DO  Vitamin D , Ergocalciferol , (DRISDOL ) 1.25 MG (50000 UNIT) CAPS capsule Take 1 capsule (50,000 Units total) by mouth every 7 (seven) days. Patient not taking: Reported on 09/09/2022 01/03/21   Antonio Meth Jamee SAUNDERS, DO                                                                                                                                    Past Surgical History Past Surgical History:  Procedure Laterality Date   ABDOMINAL HYSTERECTOMY     TAH-- complications from D &C   Family History Family History  Problem Relation Age of Onset   Hypertension Mother    Hypertension Father    Hypertension Sister    Hypertension Sister     Social History Social History   Tobacco Use   Smoking status: Never   Smokeless tobacco: Never  Substance Use Topics   Alcohol use: No    Alcohol/week: 0.0 standard drinks of alcohol   Drug use: No   Allergies Patient has no known allergies.  Review of Systems A thorough review of systems was obtained and all systems are negative except as noted in the HPI and PMH.   Physical Exam Vital Signs  I have reviewed the triage vital signs BP (!) 145/59   Pulse (!) 104   Temp 97.8 F (36.6 C)   Resp 19   Ht 5' (1.524 m)   Wt 60 kg   SpO2 100%   BMI 25.83 kg/m  Physical Exam Vitals and nursing note reviewed.  Constitutional:      General: She is not in acute distress.    Appearance: Normal appearance.  HENT:     Head: Normocephalic and  atraumatic.     Right Ear: External ear normal.     Left Ear: External ear normal.     Nose:  Nose normal.     Mouth/Throat:     Mouth: Mucous membranes are moist.  Eyes:     General: No scleral icterus.       Right eye: No discharge.        Left eye: No discharge.  Cardiovascular:     Rate and Rhythm: Normal rate and regular rhythm.     Pulses: Normal pulses.     Heart sounds: Normal heart sounds.  Pulmonary:     Effort: Pulmonary effort is normal. No respiratory distress.     Breath sounds: Normal breath sounds. No stridor.  Abdominal:     General: Abdomen is flat. There is no distension.     Palpations: Abdomen is soft.     Tenderness: There is abdominal tenderness. There is no guarding or rebound.   Musculoskeletal:     Cervical back: No rigidity.     Right lower leg: No edema.     Left lower leg: No edema.  Skin:    General: Skin is warm and dry.     Capillary Refill: Capillary refill takes less than 2 seconds.  Neurological:     Mental Status: She is alert.  Psychiatric:        Mood and Affect: Mood normal.        Behavior: Behavior normal. Behavior is cooperative.     ED Results and Treatments Labs (all labs ordered are listed, but only abnormal results are displayed) Labs Reviewed  LIPASE, BLOOD - Abnormal; Notable for the following components:      Result Value   Lipase 66 (*)    All other components within normal limits  COMPREHENSIVE METABOLIC PANEL WITH GFR - Abnormal; Notable for the following components:   Chloride 93 (*)    CO2 <7 (*)    Glucose, Bld 172 (*)    Creatinine, Ser 1.98 (*)    Calcium  10.8 (*)    Total Protein 8.2 (*)    AST 45 (*)    ALT 45 (*)    GFR, Estimated 26 (*)    All other components within normal limits  CBC - Abnormal; Notable for the following components:   WBC 19.8 (*)    MCV 103.6 (*)    All other components within normal limits  URINALYSIS, ROUTINE W REFLEX MICROSCOPIC - Abnormal; Notable for the following  components:   Hgb urine dipstick MODERATE (*)    Ketones, ur 40 (*)    Protein, ur 30 (*)    Leukocytes,Ua MODERATE (*)    All other components within normal limits  LACTIC ACID, PLASMA - Abnormal; Notable for the following components:   Lactic Acid, Venous >9.0 (*)    All other components within normal limits  LACTIC ACID, PLASMA - Abnormal; Notable for the following components:   Lactic Acid, Venous >9.0 (*)    All other components within normal limits  URINALYSIS, MICROSCOPIC (REFLEX) - Abnormal; Notable for the following components:   Bacteria, UA FEW (*)    All other components within normal limits  CBG MONITORING, ED - Abnormal; Notable for the following components:   Glucose-Capillary 164 (*)    All other components within normal limits  I-STAT VENOUS BLOOD GAS, ED - Abnormal; Notable for the following components:   pH, Ven 6.878 (*)    pCO2, Ven 19.3 (*)    pO2, Ven 100 (*)    Bicarbonate 3.6 (*)    TCO2 <5 (*)    Acid-base deficit 28.0 (*)  Calcium , Ion 1.12 (*)    HCT 34.0 (*)    Hemoglobin 11.6 (*)    All other components within normal limits  CULTURE, BLOOD (ROUTINE X 2)  CULTURE, BLOOD (ROUTINE X 2)  URINE CULTURE  OSMOLALITY                                                                                                                          Radiology CT/CXR pending  Pertinent labs & imaging results that were available during my care of the patient were reviewed by me and considered in my medical decision making (see MDM for details).  Medications Ordered in ED Medications  vancomycin  (VANCOCIN ) IVPB 1000 mg/200 mL premix (1,000 mg Intravenous New Bag/Given 08/21/23 1507)  ondansetron  (ZOFRAN ) injection 4 mg (4 mg Intravenous Given 08/21/23 1238)  sodium chloride  0.9 % bolus 1,000 mL (0 mLs Intravenous Stopped 08/21/23 1339)  famotidine  (PEPCID ) IVPB 20 mg premix (0 mg Intravenous Stopped 08/21/23 1324)  piperacillin -tazobactam (ZOSYN ) IVPB 3.375 g (0 g  Intravenous Stopped 08/21/23 1347)  sodium chloride  0.9 % bolus 1,000 mL (0 mLs Intravenous Stopped 08/21/23 1438)  morphine  (PF) 4 MG/ML injection 4 mg (4 mg Intravenous Given 08/21/23 1359)  lactated ringers  bolus 1,000 mL (1,000 mLs Intravenous New Bag/Given 08/21/23 1455)  sodium bicarbonate  injection 50 mEq (50 mEq Intravenous Given 08/21/23 1455)                                                                                                                                     Procedures .Critical Care  Performed by: Elnor Jayson LABOR, DO Authorized by: Elnor Jayson LABOR, DO   Critical care provider statement:    Critical care time (minutes):  75   Critical care time was exclusive of:  Separately billable procedures and treating other patients   Critical care was necessary to treat or prevent imminent or life-threatening deterioration of the following conditions:  Sepsis   Critical care was time spent personally by me on the following activities:  Development of treatment plan with patient or surrogate, discussions with consultants, evaluation of patient's response to treatment, examination of patient, ordering and review of laboratory studies, ordering and review of radiographic studies, ordering and performing treatments and interventions, pulse oximetry, re-evaluation of patient's condition, review of old charts and obtaining history from patient or surrogate   (including critical care time)  Medical Decision  Making / ED Course    Medical Decision Making:    Dominique Hayes is a 73 y.o. female with past medical history as below, significant for DM, HLD, HTN who presents to the ED with complaint of abdominal pain, nausea vomiting diarrhea. The complaint involves an extensive differential diagnosis and also carries with it a high risk of complications and morbidity.  Serious etiology was considered. Ddx includes but is not limited to: Differential diagnosis includes but is not exclusive to , ovarian  cyst, ovarian torsion, acute appendicitis, urinary tract infection, endometriosis, bowel obstruction, hernia, colitis, renal colic, gastroenteritis, volvulus etc.   Complete initial physical exam performed, notably the patient was in no acute distress, abdomen nonperitoneal.    Reviewed and confirmed nursing documentation for past medical history, family history, social history.  Vital signs reviewed.    Abdominal pain, nausea vomiting diarrhea > - Periumbilical pain, no fevers, no travel, no evidence of bleeding.  - LA acutely elevated, WBC elev 19.8, severe acidosis noted. BP is stable, no fever, continue IVF resus, give bicarb, give vanc/zosyn . Send blood cultures, concern for sepsis  - spouse reports pt has been making/eating yucca root at home, reports this is something that she has prepared/eaten in the past without adverse event  -possible cystitis noted on UA, send urine culture  -pt is acutely ill, she will require admission. Handoff to incoming EDP at shift change pending imaging and admission. Recommend ICU     Clinical Course as of 08/21/23 1519  Tue Aug 21, 2023  1219 Interpreter [id number] [SG]    Clinical Course User Index [SG] Elnor Jayson LABOR, DO                    Additional history obtained: -Additional history obtained from spouse -External records from outside source obtained and reviewed including: Chart review including previous notes, labs, imaging, consultation notes including  Urgent care documentation from earlier today, primary care documentation, prior admission   Lab Tests: -I ordered, reviewed, and interpreted labs.   The pertinent results include:   Labs Reviewed  LIPASE, BLOOD - Abnormal; Notable for the following components:      Result Value   Lipase 66 (*)    All other components within normal limits  COMPREHENSIVE METABOLIC PANEL WITH GFR - Abnormal; Notable for the following components:   Chloride 93 (*)    CO2 <7 (*)     Glucose, Bld 172 (*)    Creatinine, Ser 1.98 (*)    Calcium  10.8 (*)    Total Protein 8.2 (*)    AST 45 (*)    ALT 45 (*)    GFR, Estimated 26 (*)    All other components within normal limits  CBC - Abnormal; Notable for the following components:   WBC 19.8 (*)    MCV 103.6 (*)    All other components within normal limits  URINALYSIS, ROUTINE W REFLEX MICROSCOPIC - Abnormal; Notable for the following components:   Hgb urine dipstick MODERATE (*)    Ketones, ur 40 (*)    Protein, ur 30 (*)    Leukocytes,Ua MODERATE (*)    All other components within normal limits  LACTIC ACID, PLASMA - Abnormal; Notable for the following components:   Lactic Acid, Venous >9.0 (*)    All other components within normal limits  LACTIC ACID, PLASMA - Abnormal; Notable for the following components:   Lactic Acid, Venous >9.0 (*)    All other components within normal  limits  URINALYSIS, MICROSCOPIC (REFLEX) - Abnormal; Notable for the following components:   Bacteria, UA FEW (*)    All other components within normal limits  CBG MONITORING, ED - Abnormal; Notable for the following components:   Glucose-Capillary 164 (*)    All other components within normal limits  I-STAT VENOUS BLOOD GAS, ED - Abnormal; Notable for the following components:   pH, Ven 6.878 (*)    pCO2, Ven 19.3 (*)    pO2, Ven 100 (*)    Bicarbonate 3.6 (*)    TCO2 <5 (*)    Acid-base deficit 28.0 (*)    Calcium , Ion 1.12 (*)    HCT 34.0 (*)    Hemoglobin 11.6 (*)    All other components within normal limits  CULTURE, BLOOD (ROUTINE X 2)  CULTURE, BLOOD (ROUTINE X 2)  URINE CULTURE  OSMOLALITY    Notable for as above  EKG   EKG Interpretation Date/Time:    Ventricular Rate:    PR Interval:    QRS Duration:    QT Interval:    QTC Calculation:   R Axis:      Text Interpretation:           Imaging Studies ordered: I ordered imaging studies including CTAP I independently visualized the following imaging with  scope of interpretation limited to determining acute life threatening conditions related to emergency care; findings noted above I agree with the radiologist interpretation If any imaging was obtained with contrast I closely monitored patient for any possible adverse reaction a/w contrast administration in the emergency department   Medicines ordered and prescription drug management: Meds ordered this encounter  Medications   ondansetron  (ZOFRAN ) injection 4 mg   sodium chloride  0.9 % bolus 1,000 mL   famotidine  (PEPCID ) IVPB 20 mg premix   piperacillin -tazobactam (ZOSYN ) IVPB 3.375 g    Antibiotic Indication::   Intra-abdominal Infection   sodium chloride  0.9 % bolus 1,000 mL   morphine  (PF) 4 MG/ML injection 4 mg   lactated ringers  bolus 1,000 mL   sodium bicarbonate  injection 50 mEq   vancomycin  (VANCOCIN ) IVPB 1000 mg/200 mL premix    Indication::   Sepsis    -I have reviewed the patients home medicines and have made adjustments as needed   Consultations Obtained: na   Cardiac Monitoring: The patient was maintained on a cardiac monitor.  I personally viewed and interpreted the cardiac monitored which showed an underlying rhythm of: sinus tachy Continuous pulse oximetry interpreted by myself, 100% on ra.    Social Determinants of Health:  Diagnosis or treatment significantly limited by social determinants of health: non english speaker   Reevaluation: After the interventions noted above, I reevaluated the patient and found that they have improved  Co morbidities that complicate the patient evaluation  Past Medical History:  Diagnosis Date   DM (diabetes mellitus) (HCC)    Hepatitis B    Hyperlipidemia    Hypertension    Mild diastolic dysfunction       Dispostion: Disposition decision including need for hospitalization was considered, and patient disposition pending at time of sign out.    Final Clinical Impression(s) / ED Diagnoses Final diagnoses:   Acidosis  Generalized abdominal pain  Nausea and vomiting, unspecified vomiting type  Acute cystitis with hematuria  Severe sepsis (HCC)        Elnor Jayson LABOR, DO 08/21/23 1519

## 2023-08-21 NOTE — ED Provider Notes (Signed)
 [facility]    CSN: [id number] Arrival date & time: 08/21/23  0903      History   Chief Complaint No chief complaint on file.   HPI Dominique Hayes is a 73 y.o. female with a past medical history of diabetes, hyperlipidemia, hypertension presents for evaluation of nausea vomiting and diarrhea.  Translation line uses patient speaks [place]  She is accompanied by her husband.  Patient reports 2 days ago she developed multiple episodes of nonbilious nonbloody vomiting and soft diarrhea/stools.  Reports 10 episodes of vomiting in the past 24 hours and 5 loose bowel movements.  Does endorse subjective fevers but denies cough or congestion, sore throat, body aches, shortness of breath.  Has abdominal pain prior to onset of bowel movements.  Has been thinks it may be secondary to some food that he ate at home but he ate the same thing and he is not having symptoms.  No recent travel.  Denies history of IBS, Crohn's, colitis, diverticulitis.  She has been unable to keep any food or fluids down in the past 24 hours.  No OTC treatments have been used since onset.  No other concerns at this time  HPI  Past Medical History:  Diagnosis Date   DM (diabetes mellitus) (HCC)    Hepatitis B    Hyperlipidemia    Hypertension    Mild diastolic dysfunction     Patient Active Problem List   Diagnosis Date Noted   Hypoglycemia 09/10/2022   Hypoglycemia due to type 2 diabetes mellitus (HCC) 09/09/2022   Anxiety and depression 09/09/2022   Uncontrolled type 2 diabetes mellitus with hyperglycemia (HCC) 10/04/2020   Depression, major, single episode, severe (HCC) 04/23/2017   Hyperlipidemia associated with type 2 diabetes mellitus (HCC) 03/12/2017   Anxiety 03/12/2017   Cold sore 09/12/2016   DM (diabetes mellitus) type II uncontrolled, periph vascular disorder 04/03/2016   Hyperlipidemia LDL goal <70 04/03/2016   Mild diastolic dysfunction 05/26/2013   DM (diabetes mellitus) type II  uncontrolled with eye manifestation 10/18/2012   Hepatitis B 10/18/2012   Essential hypertension 10/13/2012   Nonspecific abnormal electrocardiogram (ECG) (EKG) 10/13/2012    Past Surgical History:  Procedure Laterality Date   ABDOMINAL HYSTERECTOMY     TAH-- complications from D &C    OB History   No obstetric history on file.      Home Medications    Prior to Admission medications   Medication Sig Start Date End Date Taking? Authorizing Provider  amLODipine  (NORVASC ) 5 MG tablet Take 1 tablet (5 mg total) by mouth daily. 05/22/23   Antonio Cyndee Jamee JONELLE, DO  atorvastatin  (LIPITOR) 80 MG tablet Take 1 tablet (80 mg total) by mouth daily. 05/22/23   Antonio Cyndee Jamee R, DO  blood glucose meter kit and supplies KIT Dispense based on patient and insurance preference. Use to test sugar once a day.  E11.51 10/17/17   Antonio Cyndee Jamee JONELLE, DO  chlorthalidone  (HYGROTON ) 25 MG tablet Take 1 tablet (25 mg total) by mouth daily. 05/22/23   Antonio Cyndee Jamee JONELLE, DO  ezetimibe  (ZETIA ) 10 MG tablet Take 1 tablet (10 mg total) by mouth daily. 05/29/23   Antonio Cyndee Jamee JONELLE, DO  fenofibrate  (TRICOR ) 48 MG tablet Take 1 tablet (48 mg total) by mouth daily. 05/22/23   Antonio Cyndee Jamee R, DO  glucose blood test strip Use to test sugar once a day.  E11.51 10/17/17   Antonio Cyndee Jamee JONELLE, DO  icosapent   Ethyl (VASCEPA ) 1 g capsule TAKE 2 CAPSULES BY MOUTH 2 TIMES DAILY. Patient not taking: Reported on 09/09/2022 03/30/22   Antonio Cyndee Rockers R, DO  Lancets MISC Use to test sugar once a day.  E11.51 10/17/17   Antonio Cyndee Rockers R, DO  loratadine  (CLARITIN ) 10 MG tablet TAKE 1 TABLET BY MOUTH EVERY DAY Patient not taking: Reported on 09/09/2022 03/10/19   Antonio Cyndee, Yvonne R, DO  MEGARED OMEGA-3 KRILL OIL 500 MG CAPS Take 1 capsule by mouth daily. Patient not taking: Reported on 09/09/2022 10/15/17   Antonio Cyndee, Rockers R, DO  metFORMIN  (GLUCOPHAGE ) 500 MG tablet 1/2 tab po qd 05/22/23   Antonio Cyndee, Yvonne  R, DO  metoprolol  tartrate (LOPRESSOR ) 25 MG tablet Take 1 tablet (25 mg total) by mouth 2 (two) times daily. 05/22/23   Antonio Cyndee Rockers R, DO  mometasone  (ELOCON ) 0.1 % cream APPLY TOPICALLY TO AFFECTED AREA EVERY DAY 05/23/19   Antonio Cyndee, Yvonne R, DO  nitroGLYCERIN  (NITROSTAT ) 0.4 MG SL tablet Place 1 tablet (0.4 mg total) under the tongue every 5 (five) minutes as needed for chest pain. Patient not taking: Reported on 09/09/2022 01/03/21   Lowne Chase, Yvonne R, DO  nystatin  cream (MYCOSTATIN ) Apply 1 application topically 2 (two) times daily. Patient not taking: Reported on 09/09/2022 11/15/18   Antonio Cyndee Rockers R, DO  olmesartan  (BENICAR ) 20 MG tablet Take 1 tablet (20 mg total) by mouth daily. 05/22/23   Antonio Cyndee Rockers JONELLE, DO  traZODone  (DESYREL ) 50 MG tablet Take 0.5-1 tablets (25-50 mg total) by mouth at bedtime as needed for sleep. 04/03/22   Antonio Cyndee Rockers R, DO  Vitamin D , Ergocalciferol , (DRISDOL ) 1.25 MG (50000 UNIT) CAPS capsule Take 1 capsule (50,000 Units total) by mouth every 7 (seven) days. Patient not taking: Reported on 09/09/2022 01/03/21   Antonio Cyndee Rockers JONELLE, DO    Family History Family History  Problem Relation Age of Onset   Hypertension Mother    Hypertension Father    Hypertension Sister    Hypertension Sister     Social History Social History   Tobacco Use   Smoking status: Never   Smokeless tobacco: Never  Substance Use Topics   Alcohol use: No    Alcohol/week: 0.0 standard drinks of alcohol   Drug use: No     Allergies   Patient has no known allergies.   Review of Systems Review of Systems  Gastrointestinal:  Positive for diarrhea, nausea and vomiting.     Physical Exam Triage Vital Signs ED Triage Vitals  Encounter Vitals Group     BP 08/21/23 0918 (!) 157/82     Girls Systolic BP Percentile --      Girls Diastolic BP Percentile --      Boys Systolic BP Percentile --      Boys Diastolic BP Percentile --      Pulse Rate  08/21/23 0918 (!) 114     Resp 08/21/23 0918 17     Temp 08/21/23 0918 98.3 F (36.8 C)     Temp Source 08/21/23 0918 Oral     SpO2 08/21/23 0918 98 %     Weight --      Height --      Head Circumference --      Peak Flow --      Pain Score 08/21/23 0912 5     Pain Loc --      Pain Education --  Exclude from Growth Chart --    No data found.  Updated Vital Signs BP (!) 157/82   Pulse (!) 114   Temp 98.3 F (36.8 C) (Oral)   Resp 17   SpO2 98%   Visual Acuity Right Eye Distance:   Left Eye Distance:   Bilateral Distance:    Right Eye Near:   Left Eye Near:    Bilateral Near:     Physical Exam Vitals and nursing note reviewed.  Constitutional:      General: She is not in acute distress.    Appearance: Normal appearance. She is not ill-appearing.  HENT:     Head: Normocephalic and atraumatic.  Eyes:     Pupils: Pupils are equal, round, and reactive to light.  Cardiovascular:     Rate and Rhythm: Regular rhythm. Tachycardia present.     Heart sounds: Normal heart sounds.     Comments: Mildly tachycardia 114 Pulmonary:     Effort: Pulmonary effort is normal.     Breath sounds: Normal breath sounds.  Abdominal:     General: Bowel sounds are normal. There is no distension.     Palpations: Abdomen is soft.     Tenderness: There is no abdominal tenderness. There is no guarding or rebound.  Skin:    General: Skin is warm and dry.  Neurological:     General: No focal deficit present.     Mental Status: She is alert and oriented to person, place, and time.  Psychiatric:        Mood and Affect: Mood normal.        Behavior: Behavior normal.      UC Treatments / Results  Labs (all labs ordered are listed, but only abnormal results are displayed) Labs Reviewed  POC SOFIA SARS ANTIGEN FIA    EKG   Radiology No results found.  Procedures Procedures (including critical care time)  Medications Ordered in UC Medications  ondansetron  (ZOFRAN -ODT)  disintegrating tablet 4 mg (4 mg Oral Given 08/21/23 0946)    Initial Impression / Assessment and Plan / UC Course  I have reviewed the triage vital signs and the nursing notes.  Pertinent labs & imaging results that were available during my care of the patient were reviewed by me and considered in my medical decision making (see chart for details).     Negative rapid COVID.  Patient was given sublingual Zofran  in clinic with improvement in nausea but failed p.o. challenge and continues to vomit with any fluids.  Discussed with husband given her age and reported 2 days of no fluids or food with continued vomiting and diarrhea discussed ER evaluation.  Husband and patient are in agreement with plan but has been prefers to her go by EMS as he recently had eye surgery.  Per patient and has been requested EMS contacted and patient was transferred to ER via EMS. Final Clinical Impressions(s) / UC Diagnoses   Final diagnoses:  Nausea and vomiting, unspecified vomiting type  Gastroenteritis  Mild dehydration     Discharge Instructions      Patient taken via EMS to ER per patient and husband request     ED Prescriptions   None    PDMP not reviewed this encounter.   Loreda Myla SAUNDERS, NP 08/21/23 1102

## 2023-08-21 NOTE — ED Notes (Signed)
 Patient is being discharged from the Urgent Care and sent to the Emergency Department via [facility] . Per Myla Bold, NP, patient is in need of higher level of care due to unable to keep anything down. Patient is aware and verbalizes understanding of plan of care.  Vitals:   08/21/23 0918  BP: (!) 157/82  Pulse: (!) 114  Resp: 17  Temp: 98.3 F (36.8 C)  SpO2: 98%

## 2023-08-21 NOTE — Progress Notes (Signed)
 Spoke to PharmD and poison control about possible cyanide toxicity from yuca root exposure. Literature briefly reviewed.  Planning on giving cyanokit  once the patient arrives. Question for poison control is if we will need to give additional doses.   5g hydrocobalamin over 15 min. If severe poisoning second dose 5g over 15-2 hrs depending on condition. Not sure on timeline of this yet.  Poison control team will discuss and call back if additional recommendations.   Leita SHAUNNA Gaskins, DO 08/21/23 6:06 PM [facility]  For contact information, see Amion. If no response to pager, please call PCCM consult pager. After hours, 7PM- 7AM, please call Elink.

## 2023-08-21 NOTE — ED Notes (Signed)
 Unable to obtain enough blood to fill burgundy culture tube. Pt difficult stick and unable to get complete second set of cultures. EDP notified

## 2023-08-21 NOTE — H&P (Addendum)
 NAME:  Dominique Hayes, MRN:  [id number], DOB:  1950-09-19, LOS: 0 ADMISSION DATE:  08/21/2023, CONSULTATION DATE:  8/5 REFERRING MD:  Dr. Towana, CHIEF COMPLAINT:  lactic acidosis; sepsis   History of Present Illness:  Patient is a 73 yo F w/ pertinent PMH DMT2 on metformin , HTN, HLD presents to [facility] ED on 8/5 w/ sepsis/LA.  Patient admitted last year on 08/2022 w/ n/v poor po intake. Found to be hypoglycemic and had possible uti. She has been having poor po intake over the last few days w/ n/v/d, abdominal pain. States she mixed some yuka w/ coconut milk. Denies any sick contacts. Came to [facility] ED on 8/5 for further eval. Afebrile, mildly hypertensive, and tachy 110s. Cbg 164. UA w/ moderate leukocytes. CXR clear. CT abd/pelvis cholelithiasis and choledocholithiasis without acute cholecystitis. LA >9. Cultures obtained and given iv fluids. Placed on broad spectrum abx. Vbg 6.8, 19, 100, 3.6. Given bicarb. LA remains >9 despite fluids. PCCM consulted for icu admission.  Pertinent  Medical History   Past Medical History:  Diagnosis Date   DM (diabetes mellitus) (HCC)    Hepatitis B    Hyperlipidemia    Hypertension    Mild diastolic dysfunction      Significant Hospital Events: Including procedures, antibiotic start and stop dates in addition to other pertinent events   8/5 admitted w/ possible sepsis and LA  Interim History / Subjective:  See above  Objective    Blood pressure (!) 160/75, pulse (!) 115, temperature 97.8 F (36.6 C), resp. rate (!) 31, height 5' (1.524 m), weight 60 kg, SpO2 100%.        Intake/Output Summary (Last 24 hours) at 08/21/2023 1556 Last data filed at 08/21/2023 1438 Gross per 24 hour  Intake 1092.97 ml  Output --  Net 1092.97 ml   Filed Weights   08/21/23 1147  Weight: 60 kg    Examination:  Interpreter used; speaks [place]  General:  NAD HEENT: MM pink/moist Neuro: Aox3; MAE CV: s1s2, RRR, no m/r/g PULM:  dim clear BS bilaterally GI: soft,  bsx4 active  Extremities: warm/dry, no edema     Resolved problem list   Assessment and Plan   Sepsis: possible uti vs. Gi source given hx of n/v/d; gastroenteritis? LA: concern for possible cyanide poisong given yuka consumption; possible sepsis related; also on metformin  at home Severe metabolic acidosis Plan: -iv fluids  -give 2 more amps biacrb and start bicarb drip -trend la -trend vbg/bmp -zosyn  -follow cultures -check gi panel and cdif testing -cyanokit ; give 5g hydrocobalamin and consider additional 5g -check volatiles lab and ethanol -will speak w/ poison control  Elevated creat on CKD 3b Plan: -iv fluids and bicarb as above -Trend BMP / urinary output -Replace electrolytes as indicated -Avoid nephrotoxic agents, ensure adequate renal perfusion  Mildly elevated lfts Cholelithiasis and choledocholithiasis without acute cholecystitis Plan: -ruq us   Hx of DMT2 Plan: -hold home metformin  given LA; may need to consider different option outpt -ssi and cbg monitoring  Hx of htn/hld Plan: -hold home meds for now -prn labetalol  for htn     Best Practice (right click and Reselect all SmartList Selections daily)   Diet/type: clear liquids DVT prophylaxis prophylactic heparin   Pressure ulcer(s): pressure ulcer assessment deferred  GI prophylaxis: PPI Lines: N/A Foley:  N/A Code Status:  full code Last date of multidisciplinary goals of care discussion [8/5 patient updated at bedside]  Labs   CBC: Recent Labs  Lab 08/21/23 1207 08/21/23  1421  WBC 19.8*  --   HGB 12.4 11.6*  HCT 40.1 34.0*  MCV 103.6*  --   PLT 362  --     Basic Metabolic Panel: Recent Labs  Lab 08/21/23 1207 08/21/23 1421  NA 142 142  K 4.4 4.2  CL 93*  --   CO2 <7*  --   GLUCOSE 172*  --   BUN 23  --   CREATININE 1.98*  --   CALCIUM  10.8*  --    GFR: Estimated Creatinine Clearance: 21.1 mL/min (A) (by C-G formula based on SCr of 1.98 mg/dL (H)). Recent Labs  Lab  08/21/23 1207 08/21/23 1354  WBC 19.8*  --   LATICACIDVEN >9.0* >9.0*    Liver Function Tests: Recent Labs  Lab 08/21/23 1207  AST 45*  ALT 45*  ALKPHOS 66  BILITOT 0.4  PROT 8.2*  ALBUMIN 4.8   Recent Labs  Lab 08/21/23 1207  LIPASE 66*   No results for input(s): AMMONIA in the last 168 hours.  ABG    Component Value Date/Time   HCO3 3.6 (L) 08/21/2023 1421   TCO2 <5 (L) 08/21/2023 1421   ACIDBASEDEF 28.0 (H) 08/21/2023 1421   O2SAT 91 08/21/2023 1421     Coagulation Profile: No results for input(s): INR, PROTIME in the last 168 hours.  Cardiac Enzymes: No results for input(s): CKTOTAL, CKMB, CKMBINDEX, TROPONINI in the last 168 hours.  HbA1C: Hgb A1c MFr Bld  Date/Time Value Ref Range Status  05/22/2023 11:15 AM 6.7 (H) 4.6 - 6.5 % Final    Comment:    Glycemic Control Guidelines for People with Diabetes:Non Diabetic:  <6%Goal of Therapy: <7%Additional Action Suggested:  >8%   12/19/2022 02:36 PM 6.8 (H) 4.6 - 6.5 % Final    Comment:    Glycemic Control Guidelines for People with Diabetes:Non Diabetic:  <6%Goal of Therapy: <7%Additional Action Suggested:  >8%     CBG: Recent Labs  Lab 08/21/23 1149  GLUCAP 164*    Review of Systems:   Review of Systems  Constitutional:  Negative for fever.  Respiratory:  Negative for shortness of breath.   Cardiovascular:  Negative for chest pain.  Gastrointestinal:  Positive for abdominal pain, diarrhea, nausea and vomiting.  Genitourinary:  Positive for dysuria and frequency.     Past Medical History:  She,  has a past medical history of DM (diabetes mellitus) (HCC), Hepatitis B, Hyperlipidemia, Hypertension, and Mild diastolic dysfunction.   Surgical History:   Past Surgical History:  Procedure Laterality Date   ABDOMINAL HYSTERECTOMY     TAH-- complications from D &C     Social History:   reports that she has never smoked. She has never used smokeless tobacco. She reports that she  does not drink alcohol and does not use drugs.   Family History:  Her family history includes Hypertension in her father, mother, sister, and sister.   Allergies No Known Allergies   Home Medications  Prior to Admission medications   Medication Sig Start Date End Date Taking? Authorizing Provider  amLODipine  (NORVASC ) 5 MG tablet Take 1 tablet (5 mg total) by mouth daily. 05/22/23   Antonio Cyndee Jamee JONELLE, DO  atorvastatin  (LIPITOR) 80 MG tablet Take 1 tablet (80 mg total) by mouth daily. 05/22/23   Antonio Cyndee Jamee R, DO  blood glucose meter kit and supplies KIT Dispense based on patient and insurance preference. Use to test sugar once a day.  E11.51 10/17/17   Antonio Cyndee Jamee  R, DO  chlorthalidone  (HYGROTON ) 25 MG tablet Take 1 tablet (25 mg total) by mouth daily. 05/22/23   Antonio Cyndee Jamee JONELLE, DO  ezetimibe  (ZETIA ) 10 MG tablet Take 1 tablet (10 mg total) by mouth daily. 05/29/23   Antonio Cyndee Jamee JONELLE, DO  fenofibrate  (TRICOR ) 48 MG tablet Take 1 tablet (48 mg total) by mouth daily. 05/22/23   Antonio Cyndee Jamee R, DO  glucose blood test strip Use to test sugar once a day.  E11.51 10/17/17   Antonio Cyndee Jamee JONELLE, DO  icosapent  Ethyl (VASCEPA ) 1 g capsule TAKE 2 CAPSULES BY MOUTH 2 TIMES DAILY. Patient not taking: Reported on 09/09/2022 03/30/22   Antonio Cyndee Jamee R, DO  Lancets MISC Use to test sugar once a day.  E11.51 10/17/17   Antonio Cyndee Jamee R, DO  loratadine  (CLARITIN ) 10 MG tablet TAKE 1 TABLET BY MOUTH EVERY DAY Patient not taking: Reported on 09/09/2022 03/10/19   Antonio Cyndee, Yvonne R, DO  MEGARED OMEGA-3 KRILL OIL 500 MG CAPS Take 1 capsule by mouth daily. Patient not taking: Reported on 09/09/2022 10/15/17   Antonio Cyndee, Jamee R, DO  metFORMIN  (GLUCOPHAGE ) 500 MG tablet 1/2 tab po qd 05/22/23   Antonio Cyndee, Yvonne R, DO  metoprolol  tartrate (LOPRESSOR ) 25 MG tablet Take 1 tablet (25 mg total) by mouth 2 (two) times daily. 05/22/23   Antonio Cyndee Jamee R, DO  mometasone   (ELOCON ) 0.1 % cream APPLY TOPICALLY TO AFFECTED AREA EVERY DAY 05/23/19   Antonio Cyndee, Yvonne R, DO  nitroGLYCERIN  (NITROSTAT ) 0.4 MG SL tablet Place 1 tablet (0.4 mg total) under the tongue every 5 (five) minutes as needed for chest pain. Patient not taking: Reported on 09/09/2022 01/03/21   Lowne Chase, Yvonne R, DO  nystatin  cream (MYCOSTATIN ) Apply 1 application topically 2 (two) times daily. Patient not taking: Reported on 09/09/2022 11/15/18   Antonio Cyndee Jamee R, DO  olmesartan  (BENICAR ) 20 MG tablet Take 1 tablet (20 mg total) by mouth daily. 05/22/23   Antonio Cyndee Jamee JONELLE, DO  traZODone  (DESYREL ) 50 MG tablet Take 0.5-1 tablets (25-50 mg total) by mouth at bedtime as needed for sleep. 04/03/22   Antonio Cyndee Jamee JONELLE, DO  Vitamin D , Ergocalciferol , (DRISDOL ) 1.25 MG (50000 UNIT) CAPS capsule Take 1 capsule (50,000 Units total) by mouth every 7 (seven) days. Patient not taking: Reported on 09/09/2022 01/03/21   Antonio Cyndee Jamee JONELLE, DO     Critical care time: 45 minutes     JD Emilio RIGGERS [facility] 08/21/2023, 3:56 PM  Please see [email or web address] for pager details.  From 7A-7P if no response, please call [phone]. After hours, please call ELink [phone].

## 2023-08-21 NOTE — ED Triage Notes (Addendum)
 Pt BIBA from UC- teleinterpreter Karleen [phone]- Pt c/o emesis/diarrhea x1 day. Also reports fever. Denies known sick contacts.   Has not taken DM medications x 2 days. Pt also reports difficulty starting urination, denies dysuria.

## 2023-08-21 NOTE — Progress Notes (Signed)
 Hypoglycemic Event  CBG: 45 from left hand at 2331, 41 x2 from right hand at 2330  Treatment: 8 oz juice/soda  Symptoms: None  Follow-up CBG: Time:*** CBG Result:***  Possible Reasons for Event: Inadequate meal intake and Unknown  Comments/MD notified: Dominique GLENWOOD Hemming MD notifed    Dominique LILLETTE Peek RN

## 2023-08-21 NOTE — ED Notes (Signed)
 VBG results to Dr CANDIE Pereyra @ 14:23.

## 2023-08-21 NOTE — Sepsis Progress Note (Signed)
 Sepsis protocol monitored by eLink ?

## 2023-08-22 ENCOUNTER — Inpatient Hospital Stay (HOSPITAL_COMMUNITY)

## 2023-08-22 DIAGNOSIS — D649 Anemia, unspecified: Secondary | ICD-10-CM

## 2023-08-22 DIAGNOSIS — R7401 Elevation of levels of liver transaminase levels: Secondary | ICD-10-CM

## 2023-08-22 DIAGNOSIS — I361 Nonrheumatic tricuspid (valve) insufficiency: Secondary | ICD-10-CM

## 2023-08-22 DIAGNOSIS — E872 Acidosis, unspecified: Secondary | ICD-10-CM | POA: Diagnosis not present

## 2023-08-22 LAB — BLOOD GAS, VENOUS
Acid-Base Excess: 4.9 mmol/L — ABNORMAL HIGH (ref 0.0–2.0)
Acid-Base Excess: 11.4 mmol/L — ABNORMAL HIGH (ref 0.0–2.0)
Acid-Base Excess: 10.6 mmol/L — ABNORMAL HIGH (ref 0.0–2.0)
Acid-base deficit: 7 mmol/L — ABNORMAL HIGH (ref 0.0–2.0)
Bicarbonate: 17.8 mmol/L — ABNORMAL LOW (ref 20.0–28.0)
Bicarbonate: 28.1 mmol/L — ABNORMAL HIGH (ref 20.0–28.0)
Bicarbonate: 35.9 mmol/L — ABNORMAL HIGH (ref 20.0–28.0)
Bicarbonate: 35.7 mmol/L — ABNORMAL HIGH (ref 20.0–28.0)
Drawn by: 8168
Drawn by: 70495
O2 Saturation: 71.2 %
O2 Saturation: 99.7 %
O2 Saturation: 51.6 %
O2 Saturation: 64.2 %
Patient temperature: 36.7
Patient temperature: 37
Patient temperature: 36.7
Patient temperature: 36.7
pCO2, Ven: 33 mmHg — ABNORMAL LOW (ref 44–60)
pCO2, Ven: 36 mmHg — ABNORMAL LOW (ref 44–60)
pCO2, Ven: 44 mmHg (ref 44–60)
pCO2, Ven: 47 mmHg (ref 44–60)
pH, Ven: 7.34 (ref 7.25–7.43)
pH, Ven: 7.5 — ABNORMAL HIGH (ref 7.25–7.43)
pH, Ven: 7.51 — ABNORMAL HIGH (ref 7.25–7.43)
pH, Ven: 7.48 — ABNORMAL HIGH (ref 7.25–7.43)
pO2, Ven: 40 mmHg (ref 32–45)
pO2, Ven: 77 mmHg — ABNORMAL HIGH (ref 32–45)
pO2, Ven: <31 mmHg — CL (ref 32–45)
pO2, Ven: 37 mmHg (ref 32–45)

## 2023-08-22 LAB — ECHOCARDIOGRAM COMPLETE
AR max vel: 1.53 cm2
AV Area VTI: 1.58 cm2
AV Area mean vel: 1.51 cm2
AV Mean grad: 7.5 mmHg
AV Peak grad: 13.4 mmHg
Ao pk vel: 1.83 m/s
Area-P 1/2: 4.49 cm2
Height: 60 in
S' Lateral: 2.4 cm
Weight: 2031.76 [oz_av]

## 2023-08-22 LAB — GLUCOSE, CAPILLARY
Glucose-Capillary: 127 mg/dL — ABNORMAL HIGH (ref 70–99)
Glucose-Capillary: 135 mg/dL — ABNORMAL HIGH (ref 70–99)
Glucose-Capillary: 148 mg/dL — ABNORMAL HIGH (ref 70–99)
Glucose-Capillary: 204 mg/dL — ABNORMAL HIGH (ref 70–99)
Glucose-Capillary: 241 mg/dL — ABNORMAL HIGH (ref 70–99)
Glucose-Capillary: 75 mg/dL (ref 70–99)
Glucose-Capillary: 127 mg/dL — ABNORMAL HIGH (ref 70–99)
Glucose-Capillary: 131 mg/dL — ABNORMAL HIGH (ref 70–99)
Glucose-Capillary: 163 mg/dL — ABNORMAL HIGH (ref 70–99)

## 2023-08-22 LAB — COMPREHENSIVE METABOLIC PANEL WITH GFR
ALT: 27 U/L (ref 0–44)
AST: 40 U/L (ref 15–41)
Albumin: 3.2 g/dL — ABNORMAL LOW (ref 3.5–5.0)
Alkaline Phosphatase: 35 U/L — ABNORMAL LOW (ref 38–126)
Anion gap: 15 (ref 5–15)
BUN: 28 mg/dL — ABNORMAL HIGH (ref 8–23)
CO2: 29 mmol/L (ref 22–32)
Calcium: 8.2 mg/dL — ABNORMAL LOW (ref 8.9–10.3)
Chloride: 93 mmol/L — ABNORMAL LOW (ref 98–111)
Creatinine, Ser: 1.73 mg/dL — ABNORMAL HIGH (ref 0.44–1.00)
GFR, Estimated: 31 mL/min — ABNORMAL LOW (ref >60)
Glucose, Bld: 140 mg/dL — ABNORMAL HIGH (ref 70–99)
Potassium: 3.4 mmol/L — ABNORMAL LOW (ref 3.5–5.1)
Sodium: 137 mmol/L (ref 135–145)
Total Bilirubin: 2.5 mg/dL — ABNORMAL HIGH (ref 0.0–1.2)
Total Protein: 5.8 g/dL — ABNORMAL LOW (ref 6.5–8.1)

## 2023-08-22 LAB — LACTIC ACID, PLASMA
Lactic Acid, Venous: >9 mmol/L (ref 0.5–1.9)
Lactic Acid, Venous: 7.2 mmol/L (ref 0.5–1.9)
Lactic Acid, Venous: 7.8 mmol/L (ref 0.5–1.9)
Lactic Acid, Venous: 6.7 mmol/L (ref 0.5–1.9)
Lactic Acid, Venous: 7.5 mmol/L (ref 0.5–1.9)

## 2023-08-22 LAB — MAGNESIUM: Magnesium: 1.3 mg/dL — ABNORMAL LOW (ref 1.7–2.4)

## 2023-08-22 LAB — BASIC METABOLIC PANEL WITH GFR
Anion gap: 26 — ABNORMAL HIGH (ref 5–15)
BUN: 24 mg/dL — ABNORMAL HIGH (ref 8–23)
CO2: 14 mmol/L — ABNORMAL LOW (ref 22–32)
Calcium: 8 mg/dL — ABNORMAL LOW (ref 8.9–10.3)
Chloride: 101 mmol/L (ref 98–111)
Creatinine, Ser: 1.68 mg/dL — ABNORMAL HIGH (ref 0.44–1.00)
GFR, Estimated: 32 mL/min — ABNORMAL LOW (ref >60)
Glucose, Bld: 97 mg/dL (ref 70–99)
Potassium: 4 mmol/L (ref 3.5–5.1)
Sodium: 141 mmol/L (ref 135–145)

## 2023-08-22 LAB — CBC
HCT: 26.5 % — ABNORMAL LOW (ref 36.0–46.0)
Hemoglobin: 9.1 g/dL — ABNORMAL LOW (ref 12.0–15.0)
MCH: 33.2 pg (ref 26.0–34.0)
MCHC: 34.3 g/dL (ref 30.0–36.0)
MCV: 96.7 fL (ref 80.0–100.0)
Platelets: 220 K/uL (ref 150–400)
RBC: 2.74 MIL/uL — ABNORMAL LOW (ref 3.87–5.11)
RDW: 13.1 % (ref 11.5–15.5)
WBC: 17.1 K/uL — ABNORMAL HIGH (ref 4.0–10.5)
nRBC: 0 % (ref 0.0–0.2)

## 2023-08-22 LAB — URINE CULTURE: Culture: NO GROWTH

## 2023-08-22 LAB — VOLATILES,BLD-ACETONE,ETHANOL,ISOPROP,METHANOL
Acetone, blood: <0.01 g/dL (ref 0.000–0.010)
Ethanol, blood: <0.01 g/dL (ref 0.000–0.010)
Isopropanol, blood: <0.01 g/dL (ref 0.000–0.010)
Methanol, blood: <0.01 g/dL (ref 0.000–0.010)

## 2023-08-22 MED ORDER — AMLODIPINE BESYLATE 5 MG PO TABS
5.0000 mg | ORAL_TABLET | Freq: Every day | ORAL | Status: DC
  Administered 2023-08-22 – 2023-08-25 (×4): 5 mg via ORAL
  Filled 2023-08-22 (×4): qty 1

## 2023-08-22 MED ORDER — PIPERACILLIN-TAZOBACTAM 3.375 G IVPB
3.3750 g | Freq: Three times a day (TID) | INTRAVENOUS | Status: DC
  Administered 2023-08-22 – 2023-08-23 (×3): 3.375 g via INTRAVENOUS
  Filled 2023-08-22 (×5): qty 50

## 2023-08-22 MED ORDER — MAGNESIUM SULFATE 4 GM/100ML IV SOLN
4.0000 g | Freq: Once | INTRAVENOUS | Status: AC
  Administered 2023-08-22: 4 g via INTRAVENOUS
  Filled 2023-08-22: qty 100

## 2023-08-22 MED ORDER — CHLORTHALIDONE 25 MG PO TABS
25.0000 mg | ORAL_TABLET | Freq: Every day | ORAL | Status: DC
  Administered 2023-08-22: 25 mg via ORAL
  Filled 2023-08-22 (×2): qty 1

## 2023-08-22 MED ORDER — MAGNESIUM SULFATE 2 GM/50ML IV SOLN
2.0000 g | Freq: Once | INTRAVENOUS | Status: AC
  Administered 2023-08-22: 2 g via INTRAVENOUS
  Filled 2023-08-22: qty 50

## 2023-08-22 MED ORDER — POTASSIUM CHLORIDE CRYS ER 20 MEQ PO TBCR
40.0000 meq | EXTENDED_RELEASE_TABLET | Freq: Once | ORAL | Status: AC
  Administered 2023-08-22: 40 meq via ORAL
  Filled 2023-08-22: qty 2

## 2023-08-22 NOTE — Progress Notes (Signed)
 Pharmacy Electrolyte Replacement  Recent Labs:  Recent Labs    08/22/23 0052 08/22/23 1119  K 4.0 3.4*  MG 1.3*  --   CREATININE 1.68* 1.73*    Low Critical Values (K </= 2.5, Phos </= 1, Mg </= 1) Present: None  MD Contacted: N/A  Plan: Give potassium chloride  40 mEq PO x 1 per protocol

## 2023-08-22 NOTE — Progress Notes (Signed)
 [facility] Quality Team Note  Name: Dominique Hayes Date of Birth: [birth year] MRN: [id number] Date: 08/22/2023  [facility] Quality Team has reviewed this patient's chart, please see recommendations below:  [facility] Quality Other; (CHART REVIEWED FOR CBP AND BCS. ABSTRACTED MOST RECENT CBP, NO MAMMOGRAM SINCE 2018)

## 2023-08-22 NOTE — Progress Notes (Signed)
 Call received by main lab who was notified of volatile screen results: all negative

## 2023-08-22 NOTE — Progress Notes (Signed)
 PHARMACY NOTE:  ANTIMICROBIAL RENAL DOSAGE ADJUSTMENT  Current antimicrobial regimen includes a mismatch between antimicrobial dosage and estimated renal function.  As per policy approved by the Pharmacy & Therapeutics and Medical Executive Committees, the antimicrobial dosage will be adjusted accordingly.  Current antimicrobial dosage:  Zosyn  2.25g Q8H  Indication: Possible sepsis  Renal Function: Estimated Creatinine Clearance: 23.7 mL/min (A) (by C-G formula based on SCr of 1.73 mg/dL (H)).    Antimicrobial dosage has been changed to:  Zosyn  3.375g Q8H  Additional comments:  Thank you for allowing pharmacy to be a part of this patient's care.  Maurilio Patten, PharmD PGY1 Pharmacy Resident [facility] 08/22/2023 2:24 PM

## 2023-08-22 NOTE — Progress Notes (Signed)
*  PRELIMINARY RESULTS* Echocardiogram 2D Echocardiogram has been performed.  Dominique Hayes Dominique Hayes Dominique Hayes 08/22/2023, 3:48 PM

## 2023-08-22 NOTE — Progress Notes (Addendum)
 NAME:  Dominique Hayes, MRN:  [id number], DOB:  September 13, 1950, LOS: 1 ADMISSION DATE:  08/21/2023, CONSULTATION DATE:  8/5 REFERRING MD:  Dr. Towana, CHIEF COMPLAINT:  lactic acidosis; sepsis   History of Present Illness:  Patient is a 73 yo F w/ pertinent PMH DMT2 on metformin , HTN, HLD presents to [facility] ED on 8/5 w/ sepsis/LA.  Patient admitted last year on 08/2022 w/ n/v poor po intake. Found to be hypoglycemic and had possible uti. She has been having poor po intake over the last few days w/ n/v/d, abdominal pain. States she mixed some yuka w/ coconut milk. Denies any sick contacts. Came to [facility] ED on 8/5 for further eval. Afebrile, mildly hypertensive, and tachy 110s. Cbg 164. UA w/ moderate leukocytes. CXR clear. CT abd/pelvis cholelithiasis and choledocholithiasis without acute cholecystitis. LA >9. Cultures obtained and given iv fluids. Placed on broad spectrum abx. Vbg 6.8, 19, 100, 3.6. Given bicarb. LA remains >9 despite fluids. PCCM consulted for icu admission.  Pertinent  Medical History   Past Medical History:  Diagnosis Date   DM (diabetes mellitus) (HCC)    Hepatitis B    Hyperlipidemia    Hypertension    Mild diastolic dysfunction      Significant Hospital Events: Including procedures, antibiotic start and stop dates in addition to other pertinent events   8/5 admitted w/ possible sepsis and LA 8/6 LA slightly improved to 7. Feels much better, almost at baseline.  Interim History / Subjective:  NAEON.  Objective    Blood pressure (!) 166/102, pulse 77, temperature 98 F (36.7 C), temperature source Axillary, resp. rate 17, height 5' (1.524 m), weight 57.6 kg, SpO2 98%.        Intake/Output Summary (Last 24 hours) at 08/22/2023 1037 Last data filed at 08/22/2023 1000 Gross per 24 hour  Intake 7376.52 ml  Output 350 ml  Net 7026.52 ml   Filed Weights   08/21/23 1147 08/21/23 1800 08/22/23 0500  Weight: 60 kg 52.4 kg 57.6 kg    Examination: General: Adult female, resting  in bed, in NAD. Neuro: A&O x 3, no deficits. HEENT: [place]/AT. Sclerae anicteric. EOMI. Cardiovascular: RRR, no M/R/G.  Lungs: Respirations even and unlabored.  CTA bilaterally, No W/R/R Abdomen: BS x 4, soft, NT/ND.  Musculoskeletal: No gross deformities, no edema.  GU: dark red/maroon urine in purewick canister.   Assessment and Plan   Severe metabolic acidosis with ongoing severe lactic acidosis - favor metformin  use PTA. Some slight concern for possible cyanide poisong given yuka consumption (s/p 1 dose cyanokit  8/5). Volatile alcohol panel and ethanol negative. Possible sepsis: possible UTI vs GI source given hx of n/v/d; gastroenteritis? -Continue IV fluids  -D/c bicarb today -Trend lactic acid -Continue empiric zosyn  -follow cultures -f/u on gi panel and cdif testing  Elevated creat on CKD 3b. -continue fluids -Trend BMP / urinary output -Replace electrolytes as indicated -Avoid nephrotoxic agents, ensure adequate renal perfusion  Mildly elevated lfts - RUQ US  with numerous gallstones but no cholecystitis. Cholelithiasis and choledocholithiasis without acute cholecystitis -Supportive care. -Trend LFT's.  Hx of DMT2 -SSI -hold home metformin  given LA; may need to consider different option outpt  Hx of htn/hld Plan: -hold home meds for now -prn labetalol  for htn  Anemia. -Transfuse for Hgb < 7.  Will discuss with Dr. Pawar. Has ongoing lactic acidosis but no true ICU needs at present. Should be ok for progressive status under [facility].   Best Practice (right click and Reselect all  SmartList Selections daily)   Diet/type: Regular consistency (see orders) - advance DVT prophylaxis prophylactic heparin   Pressure ulcer(s): pressure ulcer assessment deferred  GI prophylaxis: PPI Lines: N/A Foley:  N/A Code Status:  full code Last date of multidisciplinary goals of care discussion [8/5 patient updated at bedside]   Sammi Gore, PA - [facility] For pager details, please see AMION or use Epic chat  After 1900, please call ELINK for cross coverage needs 08/22/2023, 10:37 AM

## 2023-08-22 NOTE — Progress Notes (Signed)
 [facility] ADULT ICU REPLACEMENT PROTOCOL   The patient does apply for the [facility] Adult ICU Electrolyte Replacment Protocol based on the criteria listed below:   1.Exclusion criteria: TCTS, ECMO, Dialysis, and Myasthenia Gravis patients 2. Is GFR >/= 30 ml/min? Yes.    Patient's GFR today is 32 3. Is SCr </= 2? Yes.   Patient's SCr is 1.68 mg/dL 4. Did SCr increase >/= 0.5 in 24 hours? No. 5.Pt's weight >40kg  Yes.   6. Abnormal electrolyte(s): Mg = 1.3  7. Electrolytes replaced per protocol 8.  Call MD STAT for K+ </= 2.5, Phos </= 1, or Mag </= 1 Physician:  Haze, eMD   Rosina SAILOR Calib Wadhwa 08/22/2023 1:37 AM

## 2023-08-23 DIAGNOSIS — E872 Acidosis, unspecified: Principal | ICD-10-CM

## 2023-08-23 LAB — BLOOD GAS, VENOUS
Acid-Base Excess: 10.3 mmol/L — ABNORMAL HIGH (ref 0.0–2.0)
Acid-Base Excess: 10.5 mmol/L — ABNORMAL HIGH (ref 0.0–2.0)
Acid-Base Excess: 6 mmol/L — ABNORMAL HIGH (ref 0.0–2.0)
Acid-Base Excess: 6.6 mmol/L — ABNORMAL HIGH (ref 0.0–2.0)
Bicarbonate: 30.6 mmol/L — ABNORMAL HIGH (ref 20.0–28.0)
Bicarbonate: 31.9 mmol/L — ABNORMAL HIGH (ref 20.0–28.0)
Bicarbonate: 35 mmol/L — ABNORMAL HIGH (ref 20.0–28.0)
Bicarbonate: 35.1 mmol/L — ABNORMAL HIGH (ref 20.0–28.0)
Drawn by: 4956
Drawn by: 1470
O2 Saturation: 100 %
O2 Saturation: 71.9 %
O2 Saturation: 84.5 %
O2 Saturation: 89.2 %
Patient temperature: 36.4
Patient temperature: 36.8
Patient temperature: 36.8
Patient temperature: 37.3
pCO2, Ven: 44 mmHg (ref 44–60)
pCO2, Ven: 45 mmHg (ref 44–60)
pCO2, Ven: 46 mmHg (ref 44–60)
pCO2, Ven: 47 mmHg (ref 44–60)
pH, Ven: 7.44 — ABNORMAL HIGH (ref 7.25–7.43)
pH, Ven: 7.45 — ABNORMAL HIGH (ref 7.25–7.43)
pH, Ven: 7.49 — ABNORMAL HIGH (ref 7.25–7.43)
pH, Ven: 7.5 — ABNORMAL HIGH (ref 7.25–7.43)
pO2, Ven: 42 mmHg (ref 32–45)
pO2, Ven: 51 mmHg — ABNORMAL HIGH (ref 32–45)
pO2, Ven: 56 mmHg — ABNORMAL HIGH (ref 32–45)
pO2, Ven: 95 mmHg — ABNORMAL HIGH (ref 32–45)

## 2023-08-23 LAB — URINALYSIS, ROUTINE W REFLEX MICROSCOPIC

## 2023-08-23 LAB — HEPATIC FUNCTION PANEL
ALT: 19 U/L (ref 0–44)
AST: 28 U/L (ref 15–41)
Albumin: 3 g/dL — ABNORMAL LOW (ref 3.5–5.0)
Alkaline Phosphatase: 38 U/L (ref 38–126)
Bilirubin, Direct: <0.1 mg/dL (ref 0.0–0.2)
Total Bilirubin: 1.9 mg/dL — ABNORMAL HIGH (ref 0.0–1.2)
Total Protein: 5.8 g/dL — ABNORMAL LOW (ref 6.5–8.1)

## 2023-08-23 LAB — CBC
HCT: 28.6 % — ABNORMAL LOW (ref 36.0–46.0)
Hemoglobin: 9.7 g/dL — ABNORMAL LOW (ref 12.0–15.0)
MCH: 32.8 pg (ref 26.0–34.0)
MCHC: 33.9 g/dL (ref 30.0–36.0)
MCV: 96.6 fL (ref 80.0–100.0)
Platelets: 157 K/uL (ref 150–400)
RBC: 2.96 MIL/uL — ABNORMAL LOW (ref 3.87–5.11)
RDW: 13.2 % (ref 11.5–15.5)
WBC: 8.6 K/uL (ref 4.0–10.5)
nRBC: 0 % (ref 0.0–0.2)

## 2023-08-23 LAB — LACTIC ACID, PLASMA
Lactic Acid, Venous: 1.9 mmol/L (ref 0.5–1.9)
Lactic Acid, Venous: 5.8 mmol/L (ref 0.5–1.9)

## 2023-08-23 LAB — GLUCOSE, CAPILLARY
Glucose-Capillary: 124 mg/dL — ABNORMAL HIGH (ref 70–99)
Glucose-Capillary: 99 mg/dL (ref 70–99)
Glucose-Capillary: 171 mg/dL — ABNORMAL HIGH (ref 70–99)
Glucose-Capillary: 120 mg/dL — ABNORMAL HIGH (ref 70–99)
Glucose-Capillary: 162 mg/dL — ABNORMAL HIGH (ref 70–99)

## 2023-08-23 LAB — BASIC METABOLIC PANEL WITH GFR
Anion gap: 12 (ref 5–15)
BUN: 24 mg/dL — ABNORMAL HIGH (ref 8–23)
CO2: 30 mmol/L (ref 22–32)
Calcium: 8.5 mg/dL — ABNORMAL LOW (ref 8.9–10.3)
Chloride: 98 mmol/L (ref 98–111)
Creatinine, Ser: 1.69 mg/dL — ABNORMAL HIGH (ref 0.44–1.00)
GFR, Estimated: 32 mL/min — ABNORMAL LOW (ref >60)
Glucose, Bld: 105 mg/dL — ABNORMAL HIGH (ref 70–99)
Potassium: 3.6 mmol/L (ref 3.5–5.1)
Sodium: 140 mmol/L (ref 135–145)

## 2023-08-23 LAB — URINALYSIS, MICROSCOPIC (REFLEX)

## 2023-08-23 LAB — C DIFFICILE QUICK SCREEN W PCR REFLEX
C Diff antigen: NEGATIVE
C Diff interpretation: NOT DETECTED
C Diff toxin: NEGATIVE

## 2023-08-23 LAB — MAGNESIUM: Magnesium: 2.7 mg/dL — ABNORMAL HIGH (ref 1.7–2.4)

## 2023-08-23 LAB — PHOSPHORUS: Phosphorus: 1.6 mg/dL — ABNORMAL LOW (ref 2.5–4.6)

## 2023-08-23 LAB — PROCALCITONIN: Procalcitonin: 1.13 ng/mL

## 2023-08-23 MED ORDER — SODIUM CHLORIDE 0.9 % IV SOLN: INTRAVENOUS | Status: DC

## 2023-08-23 MED ORDER — SODIUM CHLORIDE 0.9 % IV BOLUS
1000.0000 mL | Freq: Once | INTRAVENOUS | Status: AC
  Administered 2023-08-23: 1000 mL via INTRAVENOUS

## 2023-08-23 MED ORDER — THIAMINE MONONITRATE 100 MG PO TABS
100.0000 mg | ORAL_TABLET | Freq: Every day | ORAL | Status: DC
  Administered 2023-08-24 – 2023-08-25 (×2): 100 mg via ORAL
  Filled 2023-08-23 (×2): qty 1

## 2023-08-23 MED ORDER — POTASSIUM PHOSPHATES 15 MMOLE/5ML IV SOLN
20.0000 mmol | Freq: Once | INTRAVENOUS | Status: AC
  Administered 2023-08-23: 20 mmol via INTRAVENOUS
  Filled 2023-08-23: qty 6.67

## 2023-08-23 NOTE — Progress Notes (Signed)
 Report given Ayaba, RN . Notified pt and husband of transfer to [facility] w/ interpreter/vietnamese language. Will be transferred to 6E[facility]22.

## 2023-08-23 NOTE — Progress Notes (Signed)
 VERY SMALL SMEAR OF BM ON PAD WHEN TRANSFERRING to new bed. Able to collect a very small smear .  Cdiff order is expired. Will notify Elink for a new order since there is no active order for this.

## 2023-08-23 NOTE — Hospital Course (Addendum)
 Patient is a 73 yo F w/ pertinent PMH DMT2 on metformin , HTN, HLD presents to [facility] ED on 8/5 w/ sepsis/LA.   Patient admitted last year on 08/2022 w/ n/v poor po intake. This visit Found to be hypoglycemic and had possible uti. She has been having poor po intake over the last few days w/ n/v/d, abdominal pain. States she mixed some yuka w/ coconut milk. Denies any sick contacts. Came to [facility] ED on 8/5 for further eval. Afebrile, mildly hypertensive, and tachy 110s. Cbg 164. UA w/ moderate leukocytes. CXR clear. CT abd/pelvis cholelithiasis and choledocholithiasis without acute cholecystitis. LA >9. Cultures obtained and given iv fluids. Placed on broad spectrum abx. Vbg 6.8, 19, 100, 3.6. Given bicarb. LA remains >9 despite fluids. PCCM consulted for icu admission.  Transferred to [facility] 8/7.  Patient has no real infectious symptoms we will monitor off of antibiotics.  Lactic acid had initially resolved but then quickly elevated again so was given more IV fluid hydration.  Will continue IV fluids for another day and PT OT recommended no follow-up so we will likely discharge her in the a.m. if she is stable  Assessment and Plan:  Severe Metabolic acidosis with ongoing Severe Lactic Acidosis: Metabolic Acidosis has resolved as she now has a CO2 of 30, anion gap of 12, chloride level of 98.  Continues to have a lactic acidosis though. Favor metformin  use PTA. Some slight concern for possible cyanide poisong given yuka consumption (s/p 1 dose cyanokit  8/5). Volatile alcohol panel and ethanol negative.  C/w IVF Hydration as below. Current LA Trend:  Recent Labs  Lab 08/22/23 0944 08/22/23 1119 08/22/23 1451 08/23/23 0916 08/23/23 1155 08/24/23 0439 08/25/23 0454  LATICACIDVEN 7.8* 6.7* 7.5* 1.9 5.8* 2.4* 0.9  -Repeat in the AM and anticipate D/C in the next 24 hours as PT/OT recommending No F/U  Possible sepsis, ruled Out: Possible UTI vs GI source given hx of n/v/d; gastroenteritis? However UTI  unlikely -Continue IV fluids as below and Bicarbonate discontinued.  -Trend lactic acid as above -PCT was 1.13 and improved to 0.67 -WBC Trend:  Recent Labs  Lab 08/21/23 1207 08/21/23 1737 08/22/23 0457 08/23/23 0458 08/24/23 0439 08/25/23 0454  WBC 19.8* 30.0* 17.1* 8.6 7.3 5.1  -IV Zosyn  discontinued and will Monitor off Abx -Blood Cx x2 showing NGTD @ 2 Days. GI Pathogen panel never sent but C Diff Negative. Send GI Pathogen panel if having significant diarrhea.  -U/A not really infectious and Urine Cx showing No Growth   AKI on CKD Stage 3b: BUN/Cr Trend: Recent Labs  Lab 08/21/23 1207 08/22/23 0052 08/22/23 1119 08/23/23 0458 08/24/23 0439 08/25/23 0454  BUN 23 24* 28* 24* 12 9  CREATININE 1.98* 1.68* 1.73* 1.69* 1.37* 1.13*  -C/w IVF with NS @ 75 mL/hr and stop tomorrow AM; CTM  UOP andAvoid Nephrotoxic Medications, Contrast Dyes, Hypotension and Dehydration to Ensure Adequate Renal Perfusion and will need to Renally Adjust Meds. CTM and Trend Renal Function carefully and repeat CMP in the AM   Chromaturia: In the setting of the patient receiving Cyanokit ; Urine is Grape Juice color. Repeat U/A sent and showed a clear appearance with pink color but was noted to be tested for hemoglobin, leukocytes, nitrites.  Rare bacteria was noted with 0-5 RBCs per high-power field, 0-5 squamous epithelial cells and 0-5 WBCs.  Her discoloration of her urine can likely last weeks.    Abnormal LFTs: Mild and Resolved. AST/ALT Trend:  Recent Labs  Lab 08/21/23  1207 08/22/23 1119 08/23/23 0916 08/24/23 0439 08/25/23 0454  AST 45* 40 28 26 20   ALT 45* 27 19 20 16   -RUQ US  with numerous gallstones but no cholecystitis. Cholelithiasis and choledocholithiasis without acute cholecystitis. C/w Supportive care. -Trend LFT's intermittently now   Diabetes Mellitus Type 2: Stop Metformin  altogether in the outpatient setting. Beta-Hydroxybutryic Acid was 3.66. C/w Sensitive Novolog  SSI sq q4h.  CTM CBGs per protocol and current trend:  Recent Labs  Lab 08/24/23 0000 08/24/23 0745 08/24/23 1231 08/24/23 1602 08/24/23 2130 08/25/23 0724 08/25/23 1152  GLUCAP 176* 106* 145* 123* 150* 100* 108*  -HbA1c of 6.7. Will need different Med than Metformin  at D/C but since HbA1c isn't terrible likely can control with Diet    Essential HTN: Held BP Meds. C/w Labetalol  5-20 mg IV q10 min PRN HBP for 10 doses. CTM BP per Protocol. Last BP reading was 160/79.   HLD: Hold home meds for now  Hypophosphatemia: Phos Level is now 2.5. Replete w/ IV K Phos  20 mmol yesterday. CTM and Replete as Necessary. Repeat CMP in the AM  Hyperbilirubinemia: Mild and initially worsened but now slowly improving. Tbili Trend improving:   Recent Labs  Lab 08/21/23 1207 08/22/23 1119 08/23/23 0916 08/24/23 0439 08/25/23 0454  BILITOT 0.4 2.5* 1.9* 1.4* 1.1  -RUQ U/S as above. CTM and Trend and repeat CMP in the AM    Normocytic Anemia / Anemia of Chronic Disease: Likely was Hemoconcentrated on Admission. Hgb/Hct Trend:  Recent Labs  Lab 08/21/23 1207 08/21/23 1421 08/21/23 1737 08/22/23 0457 08/23/23 0458 08/24/23 0439 08/25/23 0454  HGB 12.4 11.6* 11.5* 9.1* 9.7* 11.3* 9.8*  HCT 40.1 34.0* 38.9 26.5* 28.6* 33.6* 29.2*  MCV 103.6*  --  110.2* 96.7 96.6 98.0 97.0  -Check Anemia Panel in the AM. CTM for S/Sx of Bleeding and Transfuse for Hgb < 7; No overt bleeding noted. Repeat CBC in the AM   Hypoalbuminemia: Patient's Albumin Lvl went from 4.8 -> 3.2 -> 3.0 -> 3.2. CTM and Trend and repeat CMP in the AM

## 2023-08-23 NOTE — Progress Notes (Signed)
 eLink Physician-Brief Progress Note Patient Name: BEATA BEASON DOB: 25-Jun-1950 MRN: [id number]   Date of Service  08/23/2023  HPI/Events of Note   just gotten a sample of stool off bed pad but c-diff order expired  eICU Interventions  Renew order     Intervention Category Minor Interventions: Routine modifications to care plan (e.g. PRN medications for pain, fever)  Patience Nuzzo 08/23/2023, 4:10 AM

## 2023-08-23 NOTE — Plan of Care (Signed)
   Problem: Fluid Volume: Goal: Ability to maintain a balanced intake and output will improve Outcome: Progressing   Problem: Metabolic: Goal: Ability to maintain appropriate glucose levels will improve Outcome: Progressing   Problem: Tissue Perfusion: Goal: Adequacy of tissue perfusion will improve Outcome: Progressing

## 2023-08-23 NOTE — Progress Notes (Signed)
 PROGRESS NOTE    Dominique Hayes  FMW:[id number] DOB: 06/26/1950 DOA: 08/21/2023 PCP: Antonio Cyndee Jamee JONELLE, DO   Brief Narrative:  Patient is a 73 yo F w/ pertinent PMH DMT2 on metformin , HTN, HLD presents to [facility] ED on 8/5 w/ sepsis/LA.   Patient admitted last year on 08/2022 w/ n/v poor po intake. This visit Found to be hypoglycemic and had possible uti. She has been having poor po intake over the last few days w/ n/v/d, abdominal pain. States she mixed some yuka w/ coconut milk. Denies any sick contacts. Came to [facility] ED on 8/5 for further eval. Afebrile, mildly hypertensive, and tachy 110s. Cbg 164. UA w/ moderate leukocytes. CXR clear. CT abd/pelvis cholelithiasis and choledocholithiasis without acute cholecystitis. LA >9. Cultures obtained and given iv fluids. Placed on broad spectrum abx. Vbg 6.8, 19, 100, 3.6. Given bicarb. LA remains >9 despite fluids. PCCM consulted for icu admission.  Transferred to [facility] 8/7.  Patient has no real infectious symptoms we will monitor off of antibiotics.  Lactic acid had initially resolved but then quickly elevated again so was given more IV fluid hydration.  Will continue monitor and PT OT evaluating and pending eval.  Assessment and Plan:  Severe Metabolic acidosis with ongoing Severe Lactic Acidosis: Metabolic Acidosis has resolved as she now has a CO2 of 30, anion gap of 12, chloride level of 98.  Continues to have a lactic acidosis though. Favor metformin  use PTA. Some slight concern for possible cyanide poisong given yuka consumption (s/p 1 dose cyanokit  8/5). Volatile alcohol panel and ethanol negative.  Resumed IV fluid hydration as below and give a 1 Liter bolus again. Current LA Trend:  Recent Labs  Lab 08/22/23 0051 08/22/23 0457 08/22/23 0944 08/22/23 1119 08/22/23 1451 08/23/23 0916 08/23/23 1155  LATICACIDVEN >9.0* 7.2* 7.8* 6.7* 7.5* 1.9 5.8*  -Repeat in the AM  Possible sepsis, ruled Out: Possible UTI vs GI source given hx of n/v/d;  gastroenteritis? However UTI unlikely -Continue IV fluids as below and Bicarbonate discontinued.  -Trend lactic acid as above -PCT was 1.13 -WBC Trend:  Recent Labs  Lab 08/21/23 1207 08/21/23 1737 08/22/23 0457 08/23/23 0458  WBC 19.8* 30.0* 17.1* 8.6  -Continued IV Zosyn  but likely can stop today.  -Blood Cx x2 showing NGTD @ 2 Days. GI Pathogen panel never sent but C Diff Negative. Send GI Pathogen panel if having significant diarrhea.  -U/A not really infectious and Urine Cx showing No Growth -Will Monitor off Abx   AKI on CKD Stage 3b: BUN/Cr Trend: Recent Labs  Lab 08/21/23 1207 08/22/23 0052 08/22/23 1119 08/23/23 0458  BUN 23 24* 28* 24*  CREATININE 1.98* 1.68* 1.73* 1.69*  -Restart IVF with NS @ 75 mL/hr; CTM  UOP andAvoid Nephrotoxic Medications, Contrast Dyes, Hypotension and Dehydration to Ensure Adequate Renal Perfusion and will need to Renally Adjust Meds. CTM and Trend Renal Function carefully and repeat CMP in the AM   Chromaturia: In the setting of the patient receiving Cyanokit ; Urine is Grape Juice color. Repeat U/A sent and showed a clear appearance with pink color but was noted to be tested for hemoglobin, leukocytes, nitrites.  Rare bacteria was noted with 0-5 RBCs per high-power field, 0-5 squamous epithelial cells and 0-5 WBCs.  Her discoloration of her urine can likely last weeks.    Abnormal LFTs: Mild and Improved. AST/ALT Trend:  Recent Labs  Lab 08/21/23 1207 08/22/23 1119 08/23/23 0916  AST 45* 40 28  ALT 45*  27 19  -RUQ US  with numerous gallstones but no cholecystitis. Cholelithiasis and choledocholithiasis without acute cholecystitis. C/w Supportive care. -Trend LFT's.   Diabetes Mellitus Type 2: Stop Metformin  altogether in the outpatient setting. Beta-Hydroxybutryic Acid was 3.66. C/w Sensitive Novolog  SSI sq q4h. CTM CBGs per protocol and current trend:  Recent Labs  Lab 08/22/23 1541 08/22/23 1948 08/22/23 2306 08/23/23 0347  08/23/23 0744 08/23/23 1153 08/23/23 1557  GLUCAP 127* 131* 163* 124* 99 171* 120*  -HbA1c of 6.7. Will need different Med than Metformin  at D/C but since HbA1c isn't terrible likely can control with Diet    Essential HTN: Held BP Meds. C/w Labetalol  5-20 mg IV q10 min PRN HBP for 10 doses. CTM BP per Protocol. Last BP reading was 170/73.   HLD: Hold home meds for now  Hypophosphatemia: Phos Level is now 1.6. Replete w/ IV K Phos  20 mmol. CTM and Replete as Necessary. Repeat CMP in the Am  Hyperbilirubinemia: Mild and initially worsened but now slowly improving. Tbili Trend:   Recent Labs  Lab 08/21/23 1207 08/22/23 1119 08/23/23 0916  BILITOT 0.4 2.5* 1.9*  -RUQ U/S as above. CTM and Trend and repeat CMP in the AM    Normocytic Anemia / Anemia of Chronic Disease: Likely was Hemoconcentrated on Admission. Hgb/Hct Trend:  Recent Labs  Lab 08/21/23 1207 08/21/23 1421 08/21/23 1737 08/22/23 0457 08/23/23 0458  HGB 12.4 11.6* 11.5* 9.1* 9.7*  HCT 40.1 34.0* 38.9 26.5* 28.6*  MCV 103.6*  --  110.2* 96.7 96.6  -Check Anemia Panel in the AM. CTM for S/Sx of Bleeding and Transfuse for Hgb < 7; No overt bleeding noted. Repeat CBC in the AM   Hypoalbuminemia: Patient's Albumin Lvl went from 4.8 -> 3.2 -> 3.0. CTM and Trend and repeat CMP in the AM    DVT prophylaxis: heparin  injection 5,000 Units Start: 08/21/23 2200    Code Status: Full Code Family Communication: Discussed with family at bedside  Disposition Plan:  Level of care: Progressive Status is: Inpatient Remains inpatient appropriate because: Needs further clinical improvement in her labs and needs evaluation by PT OT   Consultants:  PCCM Transfer  Procedures:  As delineated as above Echocardiogram  Antimicrobials:  Anti-infectives (From admission, onward)    Start     Dose/Rate Route Frequency Ordered Stop   08/22/23 2200  piperacillin -tazobactam (ZOSYN ) IVPB 3.375 g  Status:  Discontinued        3.375  g 12.5 mL/hr over 240 Minutes Intravenous Every 8 hours 08/22/23 1421 08/23/23 1701   08/21/23 2330  piperacillin -tazobactam (ZOSYN ) IVPB 2.25 g  Status:  Discontinued        2.25 g 100 mL/hr over 30 Minutes Intravenous Every 8 hours 08/21/23 2242 08/22/23 1421   08/21/23 2200  piperacillin -tazobactam (ZOSYN ) IVPB 3.375 g  Status:  Discontinued        3.375 g 12.5 mL/hr over 240 Minutes Intravenous Every 8 hours 08/21/23 1815 08/21/23 2242   08/21/23 1515  vancomycin  (VANCOCIN ) IVPB 1000 mg/200 mL premix        1,000 mg 200 mL/hr over 60 Minutes Intravenous  Once 08/21/23 1500 08/21/23 1608   08/21/23 1300  piperacillin -tazobactam (ZOSYN ) IVPB 3.375 g        3.375 g 100 mL/hr over 30 Minutes Intravenous  Once 08/21/23 1255 08/21/23 1347       Subjective: Seen and examined at bedside and with the assistance of the [place] video translator [place]e [phone] and the patient states that  she is doing better.  States that she is weak and wanted to get up and walk.  No nausea or vomiting.  Denies any lightheadedness or dizziness.  No other concerns or complaints at this time and thinks her symptoms are improved.  Denies any diarrhea.  Objective: Vitals:   08/23/23 0418 08/23/23 0747 08/23/23 1154 08/23/23 1548  BP: (!) 159/77 (!) 183/75 (!) 170/73   Pulse: 77 70 74   Resp: 20 17 18 18   Temp: 98.9 F (37.2 C) 98.5 F (36.9 C) 98.3 F (36.8 C) 98.8 F (37.1 C)  TempSrc: Oral Oral Oral Oral  SpO2: 96% 99% 100%   Weight: 56.4 kg     Height:        Intake/Output Summary (Last 24 hours) at 08/23/2023 1717 Last data filed at 08/23/2023 1510 Gross per 24 hour  Intake 1964.67 ml  Output 3050 ml  Net -1085.33 ml   Filed Weights   08/21/23 1800 08/22/23 0500 08/23/23 0418  Weight: 52.4 kg 57.6 kg 56.4 kg   Examination: Physical Exam:  Constitutional: WN/WD [place] female in no acute distress Respiratory: Clear to auscultation bilaterally, no wheezing, rales, rhonchi or crackles. Normal  respiratory effort and patient is not tachypenic. No accessory muscle use.  Cardiovascular: RRR, no murmurs / rubs / gallops. S1 and S2 auscultated. No extremity edema. 2+ pedal pulses. No carotid bruits.  Abdomen: Soft, non-tender, non-distended.  Bowel sounds positive.  GU: Deferred.  Urine in the canister is purpleish/wine in color Musculoskeletal: No clubbing / cyanosis of digits/nails. No joint deformity upper and lower extremities.  Skin: No rashes, lesions, ulcers on limited skin evaluation. No induration; Warm and dry.  Neurologic: CN 2-12 grossly intact with no focal deficits. Romberg sign and cerebellar reflexes not assessed.  Psychiatric: Normal judgment and insight. Alert and oriented x 3. Normal mood and appropriate affect.   Data Reviewed: I have personally reviewed following labs and imaging studies  CBC: Recent Labs  Lab 08/21/23 1207 08/21/23 1421 08/21/23 1737 08/22/23 0457 08/23/23 0458  WBC 19.8*  --  30.0* 17.1* 8.6  HGB 12.4 11.6* 11.5* 9.1* 9.7*  HCT 40.1 34.0* 38.9 26.5* 28.6*  MCV 103.6*  --  110.2* 96.7 96.6  PLT 362  --  356 220 157   Basic Metabolic Panel: Recent Labs  Lab 08/21/23 1207 08/21/23 1421 08/22/23 0052 08/22/23 1119 08/23/23 0458  NA 142 142 141 137 140  K 4.4 4.2 4.0 3.4* 3.6  CL 93*  --  101 93* 98  CO2 <7*  --  14* 29 30  GLUCOSE 172*  --  97 140* 105*  BUN 23  --  24* 28* 24*  CREATININE 1.98*  --  1.68* 1.73* 1.69*  CALCIUM  10.8*  --  8.0* 8.2* 8.5*  MG  --   --  1.3*  --  2.7*  PHOS  --   --   --   --  1.6*   GFR: Estimated Creatinine Clearance: 24.1 mL/min (A) (by C-G formula based on SCr of 1.69 mg/dL (H)). Liver Function Tests: Recent Labs  Lab 08/21/23 1207 08/22/23 1119 08/23/23 0916  AST 45* 40 28  ALT 45* 27 19  ALKPHOS 66 35* 38  BILITOT 0.4 2.5* 1.9*  PROT 8.2* 5.8* 5.8*  ALBUMIN 4.8 3.2* 3.0*   Recent Labs  Lab 08/21/23 1207  LIPASE 66*   No results for input(s): AMMONIA in the last 168  hours. Coagulation Profile: No results for input(s): INR, PROTIME in  the last 168 hours. Cardiac Enzymes: No results for input(s): CKTOTAL, CKMB, CKMBINDEX, TROPONINI in the last 168 hours. BNP (last 3 results) No results for input(s): PROBNP in the last 8760 hours. HbA1C: No results for input(s): HGBA1C in the last 72 hours. CBG: Recent Labs  Lab 08/22/23 2306 08/23/23 0347 08/23/23 0744 08/23/23 1153 08/23/23 1557  GLUCAP 163* 124* 99 171* 120*   Lipid Profile: No results for input(s): CHOL, HDL, LDLCALC, TRIG, CHOLHDL, LDLDIRECT in the last 72 hours. Thyroid Function Tests: No results for input(s): TSH, T4TOTAL, FREET4, T3FREE, THYROIDAB in the last 72 hours. Anemia Panel: No results for input(s): VITAMINB12, FOLATE, FERRITIN, TIBC, IRON, RETICCTPCT in the last 72 hours. Sepsis Labs: Recent Labs  Lab 08/22/23 1119 08/22/23 1451 08/23/23 0916 08/23/23 1155  PROCALCITON  --   --  1.13  --   LATICACIDVEN 6.7* 7.5* 1.9 5.8*   Recent Results (from the past 240 hours)  Blood culture (routine x 2)     Status: None (Preliminary result)   Collection Time: 08/21/23 12:55 PM   Specimen: BLOOD  Result Value Ref Range Status   Specimen Description   Final    BLOOD HAND LEFT Performed at [facility], [address] [place], [place] [address]    Special Requests   Final    BOTTLES DRAWN AEROBIC AND ANAEROBIC Blood Culture adequate volume Performed at [facility], [address] [place], [place] [address]    Culture   Final    NO GROWTH 2 DAYS Performed at [facility], [address], [place], [place] [address]    Report Status PENDING  Incomplete  Blood culture (routine x 2)     Status: None (Preliminary result)   Collection Time: 08/21/23  1:00 PM   Specimen: BLOOD  Result Value Ref Range Status   Specimen Description   Final    BLOOD RIGHT HAND Performed at [facility], [address] [place], [place] [address]    Special Requests   Final    BOTTLES DRAWN AEROBIC ONLY Blood Culture adequate volume Performed at [facility], [address] [place], [place] [address]    Culture   Final    NO GROWTH 2 DAYS Performed at [facility], [address], [place], [place] [address]    Report Status PENDING  Incomplete  Urine Culture     Status: None   Collection Time: 08/21/23  3:17 PM   Specimen: Urine, Clean Catch  Result Value Ref Range Status   Specimen Description   Final    URINE, CLEAN CATCH Performed at [facility], [address] [place], [place] [address]    Special Requests   Final    NONE Performed at [facility], [address] [place], [place] [address]    Culture   Final    NO GROWTH Performed at [facility], [address], [place], [place] [address]    Report Status 08/22/2023 FINAL  Final  C Difficile Quick Screen w PCR reflex     Status: None   Collection Time: 08/23/23  3:45 AM   Specimen: STOOL  Result Value Ref Range Status   C Diff antigen NEGATIVE NEGATIVE Final   C Diff toxin NEGATIVE NEGATIVE Final   C Diff interpretation No C. difficile detected.  Final    Comment: Performed at [facility],  [address], [place], [place] [address]    Radiology Studies: ECHOCARDIOGRAM COMPLETE Result Date: 08/22/2023    ECHOCARDIOGRAM REPORT   Patient Name:   ADRIENNA KARIS Date of Exam: 08/22/2023 Medical Rec #:  [id number]  Height:       60.0 in Accession #:    [id number] Weight:       127.0 lb Date of Birth:  1950-07-12  BSA:          1.539 m Patient Age:    72 years   BP:           180/100 mmHg Patient Gender: F          HR:           73 bpm. Exam Location:  Inpatient Procedure: 2D Echo, Cardiac Doppler and Color Doppler (Both Spectral and Color            Flow Doppler were utilized during procedure). Indications:   Lactic acidosis  History:       Patient has no prior history of  Echocardiogram examinations.                Sepsis.  Sonographer:   Benard Stallion Referring      [phone] RAHUL P DESAI Phys: IMPRESSIONS  1. Left ventricular ejection fraction, by estimation, is 60 to 65%. The left ventricle has normal function. The left ventricle has no regional wall motion abnormalities. Left ventricular diastolic parameters are consistent with Grade I diastolic dysfunction (impaired relaxation).  2. Right ventricular systolic function is normal. The right ventricular size is normal. There is mildly elevated pulmonary artery systolic pressure. The estimated right ventricular systolic pressure is 45.0 mmHg.  3. A small pericardial effusion is present.  4. The mitral valve is normal in structure. Trivial mitral valve regurgitation. No evidence of mitral stenosis.  5. The aortic valve is tricuspid. There is mild calcification of the aortic valve. Aortic valve regurgitation is not visualized. No aortic stenosis is present.  6. The inferior vena cava is normal in size with greater than 50% respiratory variability, suggesting right atrial pressure of 3 mmHg. FINDINGS  Left Ventricle: Left ventricular ejection fraction, by estimation, is 60 to 65%. The left ventricle has normal function. The left ventricle has no regional wall motion abnormalities. The left ventricular internal cavity size was normal in size. There is  no left ventricular hypertrophy. Left ventricular diastolic parameters are consistent with Grade I diastolic dysfunction (impaired relaxation). Right Ventricle: The right ventricular size is normal. No increase in right ventricular wall thickness. Right ventricular systolic function is normal. There is mildly elevated pulmonary artery systolic pressure. The tricuspid regurgitant velocity is 3.24  m/s, and with an assumed right atrial pressure of 3 mmHg, the estimated right ventricular systolic pressure is 45.0 mmHg. Left Atrium: Left atrial size was normal in size. Right Atrium: Right  atrial size was normal in size. Pericardium: A small pericardial effusion is present. Presence of epicardial fat layer. Mitral Valve: The mitral valve is normal in structure. Trivial mitral valve regurgitation. No evidence of mitral valve stenosis. Tricuspid Valve: The tricuspid valve is grossly normal. Tricuspid valve regurgitation is mild . No evidence of tricuspid stenosis. Aortic Valve: The aortic valve is tricuspid. There is mild calcification of the aortic valve. Aortic valve regurgitation is not visualized. No aortic stenosis is present. Aortic valve mean gradient measures 7.5 mmHg. Aortic valve peak gradient measures 13.4 mmHg. Aortic valve area, by VTI measures 1.58 cm. Pulmonic Valve: The  pulmonic valve was normal in structure. Pulmonic valve regurgitation is trivial. No evidence of pulmonic stenosis. Aorta: The aortic root is normal in size and structure. Venous: The inferior vena cava is normal in size with greater than 50% respiratory variability, suggesting right atrial pressure of 3 mmHg. IAS/Shunts: No atrial level shunt detected by color flow Doppler.  LEFT VENTRICLE PLAX 2D LVIDd:         3.60 cm   Diastology LVIDs:         2.40 cm   LV e' medial:    9.36 cm/s LV PW:         0.90 cm   LV E/e' medial:  10.6 LV IVS:        0.90 cm   LV e' lateral:   7.07 cm/s LVOT diam:     1.80 cm   LV E/e' lateral: 14.1 LV SV:         56 LV SV Index:   36 LVOT Area:     2.54 cm  RIGHT VENTRICLE RV Basal diam:  3.10 cm RV Mid diam:    2.30 cm RV S prime:     12.70 cm/s TAPSE (M-mode): 2.0 cm LEFT ATRIUM             Index        RIGHT ATRIUM           Index LA diam:        3.10 cm 2.01 cm/m   RA Area:     17.20 cm LA Vol (A2C):   55.6 ml 36.13 ml/m  RA Volume:   41.00 ml  26.64 ml/m LA Vol (A4C):   46.1 ml 29.96 ml/m LA Biplane Vol: 51.2 ml 33.27 ml/m  AORTIC VALVE AV Area (Vmax):    1.53 cm AV Area (Vmean):   1.51 cm AV Area (VTI):     1.58 cm AV Vmax:           183.00 cm/s AV Vmean:          125.500 cm/s  AV VTI:            0.353 m AV Peak Grad:      13.4 mmHg AV Mean Grad:      7.5 mmHg LVOT Vmax:         110.00 cm/s LVOT Vmean:        74.400 cm/s LVOT VTI:          0.219 m LVOT/AV VTI ratio: 0.62  AORTA Ao Root diam: 2.70 cm Ao Asc diam:  3.60 cm MITRAL VALVE                TRICUSPID VALVE MV Area (PHT): 4.49 cm     TR Peak grad:   42.0 mmHg MV Decel Time: 169 msec     TR Vmax:        324.00 cm/s MV E velocity: 99.40 cm/s MV A velocity: 120.00 cm/s  SHUNTS MV E/A ratio:  0.83         Systemic VTI:  0.22 m                             Systemic Diam: 1.80 cm Soyla Merck MD Electronically signed by Soyla Merck MD Signature Date/Time: 08/22/2023/9:41:45 PM    Final    US  Abdomen Limited RUQ (LIVER/GB) Result Date: 08/21/2023 CLINICAL DATA:  Abdominal pain. EXAM: ULTRASOUND ABDOMEN LIMITED RIGHT UPPER QUADRANT COMPARISON:  CT earlier today.  Ultrasound 12/24/2022 FINDINGS: Gallbladder: Physiologically distended. Multiple intraluminal gallstones. There is a stone in the gallbladder neck. Upper normal gallbladder wall thickness at 2.8 mm. No pericholecystic fluid. No sonographic Murphy sign noted by sonographer. Common bile duct: Diameter: 9 mm. This stone in the distal common bile duct on CT is not definitively seen. Liver: No focal lesion identified. Within normal limits in parenchymal echogenicity. Portal vein is patent on color Doppler imaging with normal direction of blood flow towards the liver. Other: No right upper quadrant ascites. IMPRESSION: 1. Numerous gallstones including a stone in the gallbladder neck. No sonographic findings of acute cholecystitis. 2. Dilated common bile duct at 9 mm. Choledocholithiasis on CT is not well demonstrated on the current exam. Electronically Signed   By: Andrea Gasman M.D.   On: 08/21/2023 22:32   Scheduled Meds:  amLODipine   5 mg Oral Daily   Chlorhexidine  Gluconate Cloth  6 each Topical Daily   heparin   5,000 Units Subcutaneous Q8H   insulin  aspart  0-9 Units  Subcutaneous Q4H   [START ON 08/24/2023] thiamine   100 mg Oral Daily   Continuous Infusions:  sodium chloride  75 mL/hr at 08/23/23 0942    LOS: 2 days   Alejandro Marker, DO [facility] Available via Epic secure chat 7am-7pm After these hours, please refer to coverage provider listed on [email or web address] 08/23/2023, 5:17 PM

## 2023-08-23 NOTE — Plan of Care (Signed)
   Problem: Education: Goal: Knowledge of General Education information will improve Description Including pain rating scale, medication(s)/side effects and non-pharmacologic comfort measures Outcome: Progressing

## 2023-08-24 DIAGNOSIS — E872 Acidosis, unspecified: Secondary | ICD-10-CM | POA: Diagnosis not present

## 2023-08-24 LAB — CBC WITH DIFFERENTIAL/PLATELET
Abs Immature Granulocytes: 0.03 K/uL (ref 0.00–0.07)
Basophils Absolute: 0 K/uL (ref 0.0–0.1)
Basophils Relative: 0 %
Eosinophils Absolute: 0.2 K/uL (ref 0.0–0.5)
Eosinophils Relative: 3 %
HCT: 33.6 % — ABNORMAL LOW (ref 36.0–46.0)
Hemoglobin: 11.3 g/dL — ABNORMAL LOW (ref 12.0–15.0)
Immature Granulocytes: 0 %
Lymphocytes Relative: 24 %
Lymphs Abs: 1.7 K/uL (ref 0.7–4.0)
MCH: 32.9 pg (ref 26.0–34.0)
MCHC: 33.6 g/dL (ref 30.0–36.0)
MCV: 98 fL (ref 80.0–100.0)
Monocytes Absolute: 0.6 K/uL (ref 0.1–1.0)
Monocytes Relative: 8 %
Neutro Abs: 4.7 K/uL (ref 1.7–7.7)
Neutrophils Relative %: 65 %
Platelets: 202 K/uL (ref 150–400)
RBC: 3.43 MIL/uL — ABNORMAL LOW (ref 3.87–5.11)
RDW: 12.9 % (ref 11.5–15.5)
WBC: 7.3 K/uL (ref 4.0–10.5)
nRBC: 0 % (ref 0.0–0.2)

## 2023-08-24 LAB — COMPREHENSIVE METABOLIC PANEL WITH GFR
ALT: 20 U/L (ref 0–44)
AST: 26 U/L (ref 15–41)
Albumin: 3.2 g/dL — ABNORMAL LOW (ref 3.5–5.0)
Alkaline Phosphatase: 39 U/L (ref 38–126)
Anion gap: 11 (ref 5–15)
BUN: 12 mg/dL (ref 8–23)
CO2: 25 mmol/L (ref 22–32)
Calcium: 8.8 mg/dL — ABNORMAL LOW (ref 8.9–10.3)
Chloride: 105 mmol/L (ref 98–111)
Creatinine, Ser: 1.37 mg/dL — ABNORMAL HIGH (ref 0.44–1.00)
GFR, Estimated: 41 mL/min — ABNORMAL LOW (ref >60)
Glucose, Bld: 111 mg/dL — ABNORMAL HIGH (ref 70–99)
Potassium: 3.5 mmol/L (ref 3.5–5.1)
Sodium: 141 mmol/L (ref 135–145)
Total Bilirubin: 1.4 mg/dL — ABNORMAL HIGH (ref 0.0–1.2)
Total Protein: 6.3 g/dL — ABNORMAL LOW (ref 6.5–8.1)

## 2023-08-24 LAB — PROCALCITONIN: Procalcitonin: 0.67 ng/mL

## 2023-08-24 LAB — MAGNESIUM: Magnesium: 1.9 mg/dL (ref 1.7–2.4)

## 2023-08-24 LAB — GLUCOSE, CAPILLARY
Glucose-Capillary: 106 mg/dL — ABNORMAL HIGH (ref 70–99)
Glucose-Capillary: 123 mg/dL — ABNORMAL HIGH (ref 70–99)
Glucose-Capillary: 145 mg/dL — ABNORMAL HIGH (ref 70–99)
Glucose-Capillary: 150 mg/dL — ABNORMAL HIGH (ref 70–99)
Glucose-Capillary: 176 mg/dL — ABNORMAL HIGH (ref 70–99)

## 2023-08-24 LAB — PHOSPHORUS: Phosphorus: 2.5 mg/dL (ref 2.5–4.6)

## 2023-08-24 LAB — LACTIC ACID, PLASMA: Lactic Acid, Venous: 2.4 mmol/L (ref 0.5–1.9)

## 2023-08-24 MED ORDER — SODIUM CHLORIDE 0.9 % IV BOLUS
500.0000 mL | Freq: Once | INTRAVENOUS | Status: AC
  Administered 2023-08-24: 500 mL via INTRAVENOUS

## 2023-08-24 MED ORDER — POTASSIUM CHLORIDE CRYS ER 20 MEQ PO TBCR
40.0000 meq | EXTENDED_RELEASE_TABLET | Freq: Once | ORAL | Status: AC
  Administered 2023-08-24: 40 meq via ORAL
  Filled 2023-08-24: qty 2

## 2023-08-24 MED ORDER — SODIUM CHLORIDE 0.9 % IV SOLN: INTRAVENOUS | Status: DC

## 2023-08-24 MED ORDER — INSULIN ASPART 100 UNIT/ML IJ SOLN
0.0000 [IU] | Freq: Three times a day (TID) | INTRAMUSCULAR | Status: DC
  Administered 2023-08-24: 1 [IU] via SUBCUTANEOUS

## 2023-08-24 NOTE — Plan of Care (Signed)
  Problem: Education: Goal: Knowledge of General Education information will improve Description: Including pain rating scale, medication(s)/side effects and non-pharmacologic comfort measures Outcome: Progressing   Problem: Health Behavior/Discharge Planning: Goal: Ability to manage health-related needs will improve Outcome: Progressing   Problem: Clinical Measurements: Goal: Ability to maintain clinical measurements within normal limits will improve Outcome: Progressing Goal: Will remain free from infection Outcome: Progressing Goal: Diagnostic test results will improve Outcome: Progressing Goal: Respiratory complications will improve Outcome: Progressing Goal: Cardiovascular complication will be avoided Outcome: Progressing   Problem: Activity: Goal: Risk for activity intolerance will decrease Outcome: Progressing   Problem: Nutrition: Goal: Adequate nutrition will be maintained Outcome: Progressing   Problem: Coping: Goal: Level of anxiety will decrease Outcome: Progressing   Problem: Elimination: Goal: Will not experience complications related to bowel motility Outcome: Progressing Goal: Will not experience complications related to urinary retention Outcome: Progressing   Problem: Pain Managment: Goal: General experience of comfort will improve and/or be controlled Outcome: Progressing   Problem: Safety: Goal: Ability to remain free from injury will improve Outcome: Progressing   Problem: Skin Integrity: Goal: Risk for impaired skin integrity will decrease Outcome: Progressing   Problem: Coping: Goal: Ability to adjust to condition or change in health will improve Outcome: Progressing   Problem: Fluid Volume: Goal: Ability to maintain a balanced intake and output will improve Outcome: Progressing   Problem: Health Behavior/Discharge Planning: Goal: Ability to manage health-related needs will improve Outcome: Progressing   Problem: Metabolic: Goal:  Ability to maintain appropriate glucose levels will improve Outcome: Progressing

## 2023-08-24 NOTE — Evaluation (Signed)
 Physical Therapy Evaluation Patient Details Name: Dominique Hayes MRN: [id number] DOB: October 07, 1950 Today's Date: 08/24/2023  History of Present Illness  Pt is a 73 y.o. female admitted 8/5 for nausea and vomiting. Workup for sepsis, CT cholelithiasis and choledocholithiasis without acute cholecystitis. Negative for C-diff  PMH:DM, HLD, HTN, CKD  Clinical Impression  Pt in bed upon arrival of PT, agreeable to evaluation at this time. Prior to admission the pt was independent without use of DME, living with her spouse in a home without stairs. The pt reports activity in the home on regular basis, but no consistent exercise and no falls. Pt reports she is at her baseline mobility. She was able to complete bed mobility and sit-stand transfers without assistance or DME and demos good standing balance. She also completed hallway ambulation without assist or LOB, even with balance challenges. Pt and spouse encouraged to continue progressive walking program after return home and verbalized understanding. No further acute PT needs identified, the pt can return home with family once medically cleared. Thank you for the consult.   5X Sit-to-Stand: 14.7 sec (> 12.6 sec indicates increased risk of falls for individuals aged 59-79, > 15 sec indicates increased risk of recurrent falls)    If plan is discharge home, recommend the following: Help with stairs or ramp for entrance;Assist for transportation   Can travel by private vehicle        Equipment Recommendations None recommended by PT  Recommendations for Other Services       Functional Status Assessment Patient has had a recent decline in their functional status and demonstrates the ability to make significant improvements in function in a reasonable and predictable amount of time.     Precautions / Restrictions Precautions Precautions: Fall Recall of Precautions/Restrictions: Intact Restrictions Weight Bearing Restrictions Per Provider Order: No       Mobility  Bed Mobility Overal bed mobility: Modified Independent             General bed mobility comments: sitting EOB upon arrival    Transfers Overall transfer level: Modified independent Equipment used: None Transfers: Sit to/from Stand Sit to Stand: Modified independent (Device/Increase time)           General transfer comment: increased time, no DME. stable. completed x5 sit-stand in 14.7 sec    Ambulation/Gait Ambulation/Gait assistance: Supervision Gait Distance (Feet): 300 Feet Assistive device: None Gait Pattern/deviations: WFL(Within Functional Limits) Gait velocity: WFL, pt reports at baseline Gait velocity interpretation: <1.8 ft/sec, indicate of risk for recurrent falls   General Gait Details: slowed speed but pt able to increase speed when cued and tolerated balance challenge without issue. pt reports she is at baseline     Balance Overall balance assessment: Mild deficits observed, not formally tested                               Standardized Balance Assessment Standardized Balance Assessment : Dynamic Gait Index   Dynamic Gait Index Level Surface: Normal Change in Gait Speed: Normal Gait with Horizontal Head Turns: Mild Impairment Gait with Vertical Head Turns: Mild Impairment Gait and Pivot Turn: Normal Step Over Obstacle: Mild Impairment Step Around Obstacles: Normal Steps: Mild Impairment Total Score: 20       Pertinent Vitals/Pain Pain Assessment Pain Assessment: No/denies pain    Home Living Family/patient expects to be discharged to:: Private residence Living Arrangements: Spouse/significant other Available Help at Discharge: Family;Available 24 hours/day Type  of Home: House Home Access: Level entry       Home Layout: One level Home Equipment: Shower seat      Prior Function Prior Level of Function : Independent/Modified Independent             Mobility Comments: Ind, denies falls, walks in home  but no consistent exercise       Extremity/Trunk Assessment   Upper Extremity Assessment Upper Extremity Assessment: Defer to OT evaluation    Lower Extremity Assessment Lower Extremity Assessment: Overall WFL for tasks assessed (limited MMT due to poor command following through interpreter but pt reports sensation intact and functionally no issues)    Cervical / Trunk Assessment Cervical / Trunk Assessment: Normal  Communication   Communication Communication: Other (comment) Factors Affecting Communication: Non - English speaking, interpreter not available (ipad interpreter Tu [phone])    Cognition Arousal: Alert Behavior During Therapy: WFL for tasks assessed/performed   PT - Cognitive impairments: No apparent impairments, Difficult to assess Difficult to assess due to: Non-English speaking                     PT - Cognition Comments: answers appropriate through translator. able to follow commands, not formally assessed Following commands: Intact       Cueing Cueing Techniques: Verbal cues     General Comments General comments (skin integrity, edema, etc.): VSS on RA    Exercises Other Exercises Other Exercises: 5x sit-stand: 14.7 sec   Assessment/Plan    PT Assessment Patient does not need any further PT services  PT Problem List         PT Treatment Interventions      PT Goals (Current goals can be found in the Care Plan section)  Acute Rehab PT Goals Patient Stated Goal: to return home PT Goal Formulation: All assessment and education complete, DC therapy Time For Goal Achievement: 08/31/23 Potential to Achieve Goals: Good     AM-PAC PT 6 Clicks Mobility  Outcome Measure Help needed turning from your back to your side while in a flat bed without using bedrails?: None Help needed moving from lying on your back to sitting on the side of a flat bed without using bedrails?: None Help needed moving to and from a bed to a chair (including a  wheelchair)?: None Help needed standing up from a chair using your arms (e.g., wheelchair or bedside chair)?: None Help needed to walk in hospital room?: A Little Help needed climbing 3-5 steps with a railing? : A Little 6 Click Score: 22    End of Session Equipment Utilized During Treatment: Gait belt Activity Tolerance: Patient tolerated treatment well Patient left: in bed;with call bell/phone within reach;with family/visitor present Nurse Communication: Mobility status PT Visit Diagnosis: Difficulty in walking, not elsewhere classified (R26.2)    Time: 8775-8757 PT Time Calculation (min) (ACUTE ONLY): 18 min   Charges:   PT Evaluation $PT Eval Low Complexity: 1 Low   PT General Charges $$ ACUTE PT VISIT: 1 Visit         Izetta Call, PT, DPT   Acute Rehabilitation Department Office [phone] Secure Chat Communication Preferred  Izetta JULIANNA Call 08/24/2023, 1:23 PM

## 2023-08-24 NOTE — Progress Notes (Signed)
 PROGRESS NOTE    Dominique PRINCIPATO  FMW:[id number] DOB: 11/17/1950 DOA: 08/21/2023 PCP: Antonio Cyndee Jamee JONELLE, DO   Brief Narrative:  Patient is a 73 yo F w/ pertinent PMH DMT2 on metformin , HTN, HLD presents to [facility] ED on 8/5 w/ sepsis/LA.   Patient admitted last year on 08/2022 w/ n/v poor po intake. This visit Found to be hypoglycemic and had possible uti. She has been having poor po intake over the last few days w/ n/v/d, abdominal pain. States she mixed some yuka w/ coconut milk. Denies any sick contacts. Came to [facility] ED on 8/5 for further eval. Afebrile, mildly hypertensive, and tachy 110s. Cbg 164. UA w/ moderate leukocytes. CXR clear. CT abd/pelvis cholelithiasis and choledocholithiasis without acute cholecystitis. LA >9. Cultures obtained and given iv fluids. Placed on broad spectrum abx. Vbg 6.8, 19, 100, 3.6. Given bicarb. LA remains >9 despite fluids. PCCM consulted for icu admission.  Transferred to [facility] 8/7.  Patient has no real infectious symptoms we will monitor off of antibiotics.  Lactic acid had initially resolved but then quickly elevated again so was given more IV fluid hydration.  Will continue IV fluids for another day and PT OT recommended no follow-up so we will likely discharge her in the a.m. if she is stable  Assessment and Plan:  Severe Metabolic acidosis with ongoing Severe Lactic Acidosis: Metabolic Acidosis has resolved as she now has a CO2 of 30, anion gap of 12, chloride level of 98.  Continues to have a lactic acidosis though. Favor metformin  use PTA. Some slight concern for possible cyanide poisong given yuka consumption (s/p 1 dose cyanokit  8/5). Volatile alcohol panel and ethanol negative.  C/w IVF Hydration as below. Current LA Trend:  Recent Labs  Lab 08/22/23 0457 08/22/23 0944 08/22/23 1119 08/22/23 1451 08/23/23 0916 08/23/23 1155 08/24/23 0439  LATICACIDVEN 7.2* 7.8* 6.7* 7.5* 1.9 5.8* 2.4*  -Repeat in the AM and anticipate D/C in the next 24 hours as  PT/OT recommending No F/U  Possible sepsis, ruled Out: Possible UTI vs GI source given hx of n/v/d; gastroenteritis? However UTI unlikely -Continue IV fluids as below and Bicarbonate discontinued.  -Trend lactic acid as above -PCT was 1.13 and improved to 0.67 -WBC Trend:  Recent Labs  Lab 08/21/23 1207 08/21/23 1737 08/22/23 0457 08/23/23 0458 08/24/23 0439  WBC 19.8* 30.0* 17.1* 8.6 7.3  -IV Zosyn  discontinued and will Monitor off Abx -Blood Cx x2 showing NGTD @ 2 Days. GI Pathogen panel never sent but C Diff Negative. Send GI Pathogen panel if having significant diarrhea.  -U/A not really infectious and Urine Cx showing No Growth   AKI on CKD Stage 3b: BUN/Cr Trend: Recent Labs  Lab 08/21/23 1207 08/22/23 0052 08/22/23 1119 08/23/23 0458 08/24/23 0439  BUN 23 24* 28* 24* 12  CREATININE 1.98* 1.68* 1.73* 1.69* 1.37*  -C/w IVF with NS @ 75 mL/hr and stop tomorrow AM; CTM  UOP andAvoid Nephrotoxic Medications, Contrast Dyes, Hypotension and Dehydration to Ensure Adequate Renal Perfusion and will need to Renally Adjust Meds. CTM and Trend Renal Function carefully and repeat CMP in the AM   Chromaturia: In the setting of the patient receiving Cyanokit ; Urine is Grape Juice color. Repeat U/A sent and showed a clear appearance with pink color but was noted to be tested for hemoglobin, leukocytes, nitrites.  Rare bacteria was noted with 0-5 RBCs per high-power field, 0-5 squamous epithelial cells and 0-5 WBCs.  Her discoloration of her urine can likely  last weeks.    Abnormal LFTs: Mild and Resolved. AST/ALT Trend:  Recent Labs  Lab 08/21/23 1207 08/22/23 1119 08/23/23 0916 08/24/23 0439  AST 45* 40 28 26  ALT 45* 27 19 20   -RUQ US  with numerous gallstones but no cholecystitis. Cholelithiasis and choledocholithiasis without acute cholecystitis. C/w Supportive care. -Trend LFT's intermittently now   Diabetes Mellitus Type 2: Stop Metformin  altogether in the outpatient setting.  Beta-Hydroxybutryic Acid was 3.66. C/w Sensitive Novolog  SSI sq q4h. CTM CBGs per protocol and current trend:  Recent Labs  Lab 08/23/23 1153 08/23/23 1557 08/23/23 2039 08/24/23 0000 08/24/23 0745 08/24/23 1231 08/24/23 1602  GLUCAP 171* 120* 162* 176* 106* 145* 123*  -HbA1c of 6.7. Will need different Med than Metformin  at D/C but since HbA1c isn't terrible likely can control with Diet    Essential HTN: Held BP Meds. C/w Labetalol  5-20 mg IV q10 min PRN HBP for 10 doses. CTM BP per Protocol. Last BP reading was 160/79.   HLD: Hold home meds for now  Hypophosphatemia: Phos Level is now 2.5. Replete w/ IV K Phos  20 mmol yesterday. CTM and Replete as Necessary. Repeat CMP in the AM  Hyperbilirubinemia: Mild and initially worsened but now slowly improving. Tbili Trend improving:   Recent Labs  Lab 08/21/23 1207 08/22/23 1119 08/23/23 0916 08/24/23 0439  BILITOT 0.4 2.5* 1.9* 1.4*  -RUQ U/S as above. CTM and Trend and repeat CMP in the AM    Normocytic Anemia / Anemia of Chronic Disease: Likely was Hemoconcentrated on Admission. Hgb/Hct Trend:  Recent Labs  Lab 08/21/23 1207 08/21/23 1421 08/21/23 1737 08/22/23 0457 08/23/23 0458 08/24/23 0439  HGB 12.4 11.6* 11.5* 9.1* 9.7* 11.3*  HCT 40.1 34.0* 38.9 26.5* 28.6* 33.6*  MCV 103.6*  --  110.2* 96.7 96.6 98.0  -Check Anemia Panel in the AM. CTM for S/Sx of Bleeding and Transfuse for Hgb < 7; No overt bleeding noted. Repeat CBC in the AM   Hypoalbuminemia: Patient's Albumin Lvl went from 4.8 -> 3.2 -> 3.0 -> 3.2. CTM and Trend and repeat CMP in the AM    DVT prophylaxis: heparin  injection 5,000 Units Start: 08/21/23 2200    Code Status: Full Code Family Communication: Discussed with family currently at bedside  Disposition Plan:  Level of care: Progressive Status is: Inpatient Remains inpatient appropriate because: Needs further clinical improvement and anticipating discharge in next 24 hours   Consultants:  PCCM  Transfer  Procedures:  As delineated as above Echocardiogram  Antimicrobials:  Anti-infectives (From admission, onward)    Start     Dose/Rate Route Frequency Ordered Stop   08/22/23 2200  piperacillin -tazobactam (ZOSYN ) IVPB 3.375 g  Status:  Discontinued        3.375 g 12.5 mL/hr over 240 Minutes Intravenous Every 8 hours 08/22/23 1421 08/23/23 1701   08/21/23 2330  piperacillin -tazobactam (ZOSYN ) IVPB 2.25 g  Status:  Discontinued        2.25 g 100 mL/hr over 30 Minutes Intravenous Every 8 hours 08/21/23 2242 08/22/23 1421   08/21/23 2200  piperacillin -tazobactam (ZOSYN ) IVPB 3.375 g  Status:  Discontinued        3.375 g 12.5 mL/hr over 240 Minutes Intravenous Every 8 hours 08/21/23 1815 08/21/23 2242   08/21/23 1515  vancomycin  (VANCOCIN ) IVPB 1000 mg/200 mL premix        1,000 mg 200 mL/hr over 60 Minutes Intravenous  Once 08/21/23 1500 08/21/23 1608   08/21/23 1300  piperacillin -tazobactam (ZOSYN ) IVPB 3.375 g  3.375 g 100 mL/hr over 30 Minutes Intravenous  Once 08/21/23 1255 08/21/23 1347       Subjective: Seen and examined at bedside with the assistance of a [place] video translator Luke [phone] and the patient states that she is doing much better.  Had no issues overnight.  Wanting to walk.  No nausea or vomiting.  No lightheadedness or dizziness.  Feels stronger asking when she can go home.  She denies any other concerns or complaints at this time.  Objective: Vitals:   08/24/23 0408 08/24/23 0425 08/24/23 0746 08/24/23 1604  BP: (!) 196/99  (!) 170/83 (!) 160/79  Pulse: 72  72 89  Resp: 18  17 16   Temp: (!) 97.5 F (36.4 C)  99.1 F (37.3 C) 98.5 F (36.9 C)  TempSrc: Oral  Oral Oral  SpO2: 100%  98% 98%  Weight: 54.4 kg 53.7 kg    Height:        Intake/Output Summary (Last 24 hours) at 08/24/2023 1814 Last data filed at 08/24/2023 1015 Gross per 24 hour  Intake 480 ml  Output 1650 ml  Net -1170 ml   Filed Weights   08/23/23 0418 08/24/23 0408  08/24/23 0425  Weight: 56.4 kg 54.4 kg 53.7 kg   Examination: Physical Exam:  Constitutional: WN/WD [place] female in no acute distress Respiratory: Diminished to auscultation bilaterally, no wheezing, rales, rhonchi or crackles. Normal respiratory effort and patient is not tachypenic. No accessory muscle use.  Unlabored breathing Cardiovascular: RRR, no murmurs / rubs / gallops. S1 and S2 auscultated. No extremity edema.  Abdomen: Soft, non-tender, non-distended. Bowel sounds positive.  GU: Deferred.  Urine in canister is still a dark purpleish and maroonish in color Musculoskeletal: No clubbing / cyanosis of digits/nails. No joint deformity upper and lower extremities.  Skin: No rashes, lesions, ulcers on limited skin evaluation. No induration; Warm and dry.  Neurologic: CN 2-12 grossly intact with no focal deficits. Romberg sign and cerebellar reflexes not assessed.  Psychiatric: Normal judgment and insight. Alert and oriented x 3. Normal mood and appropriate affect.   Data Reviewed: I have personally reviewed following labs and imaging studies  CBC: Recent Labs  Lab 08/21/23 1207 08/21/23 1421 08/21/23 1737 08/22/23 0457 08/23/23 0458 08/24/23 0439  WBC 19.8*  --  30.0* 17.1* 8.6 7.3  NEUTROABS  --   --   --   --   --  4.7  HGB 12.4 11.6* 11.5* 9.1* 9.7* 11.3*  HCT 40.1 34.0* 38.9 26.5* 28.6* 33.6*  MCV 103.6*  --  110.2* 96.7 96.6 98.0  PLT 362  --  356 220 157 202   Basic Metabolic Panel: Recent Labs  Lab 08/21/23 1207 08/21/23 1421 08/22/23 0052 08/22/23 1119 08/23/23 0458 08/24/23 0439  NA 142 142 141 137 140 141  K 4.4 4.2 4.0 3.4* 3.6 3.5  CL 93*  --  101 93* 98 105  CO2 <7*  --  14* 29 30 25   GLUCOSE 172*  --  97 140* 105* 111*  BUN 23  --  24* 28* 24* 12  CREATININE 1.98*  --  1.68* 1.73* 1.69* 1.37*  CALCIUM  10.8*  --  8.0* 8.2* 8.5* 8.8*  MG  --   --  1.3*  --  2.7* 1.9  PHOS  --   --   --   --  1.6* 2.5   GFR: Estimated Creatinine Clearance:  27.1 mL/min (A) (by C-G formula based on SCr of 1.37 mg/dL (H)). Liver Function  Tests: Recent Labs  Lab 08/21/23 1207 08/22/23 1119 08/23/23 0916 08/24/23 0439  AST 45* 40 28 26  ALT 45* 27 19 20   ALKPHOS 66 35* 38 39  BILITOT 0.4 2.5* 1.9* 1.4*  PROT 8.2* 5.8* 5.8* 6.3*  ALBUMIN 4.8 3.2* 3.0* 3.2*   Recent Labs  Lab 08/21/23 1207  LIPASE 66*   No results for input(s): AMMONIA in the last 168 hours. Coagulation Profile: No results for input(s): INR, PROTIME in the last 168 hours. Cardiac Enzymes: No results for input(s): CKTOTAL, CKMB, CKMBINDEX, TROPONINI in the last 168 hours. BNP (last 3 results) No results for input(s): PROBNP in the last 8760 hours. HbA1C: No results for input(s): HGBA1C in the last 72 hours. CBG: Recent Labs  Lab 08/23/23 2039 08/24/23 0000 08/24/23 0745 08/24/23 1231 08/24/23 1602  GLUCAP 162* 176* 106* 145* 123*   Lipid Profile: No results for input(s): CHOL, HDL, LDLCALC, TRIG, CHOLHDL, LDLDIRECT in the last 72 hours. Thyroid Function Tests: No results for input(s): TSH, T4TOTAL, FREET4, T3FREE, THYROIDAB in the last 72 hours. Anemia Panel: No results for input(s): VITAMINB12, FOLATE, FERRITIN, TIBC, IRON, RETICCTPCT in the last 72 hours. Sepsis Labs: Recent Labs  Lab 08/22/23 1451 08/23/23 0916 08/23/23 1155 08/24/23 0439  PROCALCITON  --  1.13  --  0.67  LATICACIDVEN 7.5* 1.9 5.8* 2.4*   Recent Results (from the past 240 hours)  Blood culture (routine x 2)     Status: None (Preliminary result)   Collection Time: 08/21/23 12:55 PM   Specimen: BLOOD  Result Value Ref Range Status   Specimen Description   Final    BLOOD HAND LEFT Performed at [facility], [address] [place], [place] [address]    Special Requests   Final    BOTTLES DRAWN AEROBIC AND ANAEROBIC Blood Culture adequate volume Performed at [facility], [address] [place],  [place] [address]    Culture   Final    NO GROWTH 3 DAYS Performed at [facility], [address] [place], [place] [address]    Report Status PENDING  Incomplete  Blood culture (routine x 2)     Status: None (Preliminary result)   Collection Time: 08/21/23  1:00 PM   Specimen: BLOOD  Result Value Ref Range Status   Specimen Description   Final    BLOOD RIGHT HAND Performed at [facility], [address] [place], [place] [address]    Special Requests   Final    BOTTLES DRAWN AEROBIC ONLY Blood Culture adequate volume Performed at [facility], [address] [place], [place] [address]    Culture   Final    NO GROWTH 3 DAYS Performed at [facility], [address], [place], [place] [address]    Report Status PENDING  Incomplete  Urine Culture     Status: None   Collection Time: 08/21/23  3:17 PM   Specimen: Urine, Clean Catch  Result Value Ref Range Status   Specimen Description   Final    URINE, CLEAN CATCH Performed at [facility], [address] [place], [place] [address]    Special Requests   Final    NONE Performed at [facility], [address] [place], [place] [address]    Culture   Final    NO GROWTH Performed at [facility], [address], [place], [place]  [address]    Report Status 08/22/2023 FINAL  Final  C Difficile Quick Screen w PCR reflex     Status: None   Collection Time: 08/23/23  3:45 AM   Specimen: STOOL  Result Value Ref Range Status   C Diff antigen NEGATIVE NEGATIVE Final   C Diff toxin NEGATIVE NEGATIVE Final   C Diff interpretation No C. difficile detected.  Final    Comment: Performed at [facility], [address] [place], [place] [address]    Radiology Studies: No results found.  Scheduled Meds:  amLODipine   5 mg Oral Daily   Chlorhexidine  Gluconate Cloth  6 each Topical Daily   heparin   5,000 Units Subcutaneous Q8H   insulin  aspart  0-9 Units Subcutaneous TID AC  & HS   thiamine   100 mg Oral Daily   Continuous Infusions:  sodium chloride       LOS: 3 days   Alejandro Marker, DO [facility] Available via Epic secure chat 7am-7pm After these hours, please refer to coverage provider listed on [email or web address] 08/24/2023, 6:14 PM

## 2023-08-24 NOTE — TOC Initial Note (Signed)
 Transition of Care [facility]) - Initial/Assessment Note    Patient Details  Name: Dominique Hayes MRN: [id number] Date of Birth: [birth year]  [facility]) CM/SW Contact:    Sudie Erminio Deems, RN Phone Number: 08/24/2023, 2:05 PM  Clinical Narrative: Spoke with patient via video interpreter Anh # [phone]. Patient presented for lactic acidosis and sepsis. PTA patient was from home with spouse. Patient states she does not use any DME in the home. Patient has PCP and she has not issues with transportation. Patient states she is able to afford medications. Spouse had questions regarding the [employer] brand meter from [facility] and spouse states he always gets two numbers like 5.6 instead of an actual glucose. Case Manager contacted the diabetes coordinator to see if they could visit the patient demonstrate the meter. Case Manager spoke with Tinnie and she will speak with patient and spouse. No home needs identified at this time. Case Manager will continue to follow for additional needs.             Expected Discharge Plan: Home/Self Care Barriers to Discharge: No Barriers Identified   Patient Goals and CMS Choice Patient states their goals for this hospitalization and ongoing recovery are:: Plan to return home once stable   Choice offered to / list presented to : NA      Expected Discharge Plan and Services   Discharge Planning Services: CM Consult Post Acute Care Choice: NA Living arrangements for the past 2 months: Single Family Home                   DME Agency: NA                  Prior Living Arrangements/Services Living arrangements for the past 2 months: Single Family Home Lives with:: Spouse Patient language and need for interpreter reviewed:: Yes (video interpreter Anh [phone]) Do you feel safe going back to the place where you live?: Yes      Need for Family Participation in Patient Care: No (Comment) Care giver support system in place?: No (comment)    Criminal Activity/Legal Involvement Pertinent to Current Situation/Hospitalization: No - Comment as needed  Activities of Daily Living      Permission Sought/Granted Permission sought to share information with : Case Manager                Emotional Assessment Appearance:: Appears stated age Attitude/Demeanor/Rapport: Engaged Affect (typically observed): Appropriate Orientation: : Oriented to Self, Oriented to Place, Oriented to  Time, Oriented to Situation Alcohol / Substance Use: Not Applicable Psych Involvement: No (comment)  Admission diagnosis:  Acidosis [E87.20] Generalized abdominal pain [R10.84] Acute cystitis with hematuria [N30.01] Sepsis (HCC) [A41.9] Severe sepsis (HCC) [A41.9, R65.20] Nausea and vomiting, unspecified vomiting type [R11.2] Lactic acidosis [E87.20] Patient Active Problem List   Diagnosis Date Noted   Sepsis (HCC) 08/21/2023   Lactic acidosis 08/21/2023   Hypoglycemia 09/10/2022   Hypoglycemia due to type 2 diabetes mellitus (HCC) 09/09/2022   Anxiety and depression 09/09/2022   Uncontrolled type 2 diabetes mellitus with hyperglycemia (HCC) 10/04/2020   Depression, major, single episode, severe (HCC) 04/23/2017   Hyperlipidemia associated with type 2 diabetes mellitus (HCC) 03/12/2017   Anxiety 03/12/2017   Cold sore 09/12/2016   DM (diabetes mellitus) type II uncontrolled, periph vascular disorder 04/03/2016   Hyperlipidemia LDL goal <70 04/03/2016   Mild diastolic dysfunction 05/26/2013   DM (diabetes mellitus) type II uncontrolled with eye manifestation 10/18/2012   Hepatitis  B 10/18/2012   Essential hypertension 10/13/2012   Nonspecific abnormal electrocardiogram (ECG) (EKG) 10/13/2012   PCP:  Antonio Cyndee Jamee JONELLE, DO Pharmacy:   [facility] [address] [place] [facility] [address] [place] [address] Phone: [phone] Fax: [phone]  [facility] [address] [place] [address] Phone: [phone] Fax: [phone]     Social Drivers of Health (SDOH) Social History: SDOH Screenings   Food Insecurity: Patient Unable To Answer (08/23/2023)  Housing: Patient Unable To Answer (08/23/2023)  Transportation Needs: Patient Unable To Answer (08/23/2023)  Utilities: Patient Unable To Answer (08/23/2023)  Depression (PHQ2-9): Low Risk  (07/21/2021)  Social Connections: Unknown (08/23/2023)  Tobacco Use: Low Risk  (08/21/2023)   SDOH Interventions:     Readmission Risk Interventions     No data to display

## 2023-08-24 NOTE — Evaluation (Signed)
 Occupational Therapy Evaluation & Discharge Patient Details Name: Dominique Hayes MRN: [id number] DOB: 03/28/1950 Today's Date: 08/24/2023   History of Present Illness   Pt is a 73 y.o. female admitted 8/5 for nausea and vomiting. Workup for sepsis, CT cholelithiasis and choledocholithiasis without acute cholecystitis. Negative for C-diff  PMH:DM, HLD, HTN, CKD     Clinical Impressions Pt admitted based on above, and was seen based on problem list below. PTA pt was mod I with ADLs and IADLs. Today pt is at her functional baseline of mod I with ADLs. Pt tolerating standing ADLs, transfers, and functional mobility in room well. No LOB or SOB. Pt denies any lightheadedness or nausea with mobility. No follow up OT or DME needs. All education complete, no further acute OT needs identified. OT is signing off on this pt.       If plan is discharge home, recommend the following:   Assistance with cooking/housework     Functional Status Assessment   Patient has not had a recent decline in their functional status     Equipment Recommendations   None recommended by OT      Precautions/Restrictions   Precautions Precautions: Fall Recall of Precautions/Restrictions: Intact Restrictions Weight Bearing Restrictions Per Provider Order: No     Mobility Bed Mobility Overal bed mobility: Modified Independent       General bed mobility comments: HOB minimally elevated    Transfers Overall transfer level: Needs assistance Equipment used: None Transfers: Sit to/from Stand, Bed to chair/wheelchair/BSC Sit to Stand: Supervision     Step pivot transfers: Supervision     General transfer comment: Pt occassionally furniture surfing in room, but mostly s for safety      Balance Overall balance assessment: Mild deficits observed, not formally tested       ADL either performed or assessed with clinical judgement   ADL Overall ADL's : At baseline;Modified independent        General ADL Comments: Pt at baseline of mod I for ADLs, able to complete standing grooming tasks, dressing, and toileting at Mod I, no LOB or SOB     Vision Baseline Vision/History: 0 No visual deficits Patient Visual Report: No change from baseline Vision Assessment?: No apparent visual deficits            Pertinent Vitals/Pain Pain Assessment Pain Assessment: No/denies pain     Extremity/Trunk Assessment Upper Extremity Assessment Upper Extremity Assessment: Generalized weakness   Lower Extremity Assessment Lower Extremity Assessment: Defer to PT evaluation   Cervical / Trunk Assessment Cervical / Trunk Assessment: Normal   Communication Communication Communication: Other (comment) Factors Affecting Communication: Non - English speaking, interpreter not available (ipad interpreter used)   Cognition Arousal: Alert Behavior During Therapy: WFL for tasks assessed/performed Cognition: No apparent impairments       Following commands: Intact       Cueing  General Comments   Cueing Techniques: Verbal cues  VSS on RA, pt denies any nausea           Home Living Family/patient expects to be discharged to:: Private residence Living Arrangements: Spouse/significant other Available Help at Discharge: Family;Available 24 hours/day Type of Home: House Home Access: Level entry     Home Layout: One level     Bathroom Shower/Tub: Chief Strategy Officer: Standard Bathroom Accessibility: Yes How Accessible: Accessible via walker Home Equipment: Shower seat          Prior Functioning/Environment Prior Level of Function : Independent/Modified  Independent     Mobility Comments: Ind, denies falls      OT Problem List: Decreased strength;Decreased range of motion;Decreased activity tolerance;Cardiopulmonary status limiting activity        OT Goals(Current goals can be found in the care plan section)   Acute Rehab OT Goals Patient Stated  Goal: To get clean sheets OT Goal Formulation: All assessment and education complete, DC therapy Time For Goal Achievement: 09/07/23 Potential to Achieve Goals: Good   AM-PAC OT 6 Clicks Daily Activity     Outcome Measure Help from another person eating meals?: None Help from another person taking care of personal grooming?: None Help from another person toileting, which includes using toliet, bedpan, or urinal?: None Help from another person bathing (including washing, rinsing, drying)?: None Help from another person to put on and taking off regular upper body clothing?: None Help from another person to put on and taking off regular lower body clothing?: None 6 Click Score: 24   End of Session Equipment Utilized During Treatment: Gait belt Nurse Communication: Mobility status  Activity Tolerance: Patient tolerated treatment well Patient left: in chair;with call bell/phone within reach;with chair alarm set  OT Visit Diagnosis: Unsteadiness on feet (R26.81);Other abnormalities of gait and mobility (R26.89);Muscle weakness (generalized) (M62.81)                Time: 9171-9099 OT Time Calculation (min): 32 min Charges:  OT General Charges $OT Visit: 1 Visit OT Evaluation $OT Eval Moderate Complexity: 1 Mod OT Treatments $Self Care/Home Management : 8-22 mins  Adrianne BROCKS, OT  Acute Rehabilitation Services Office [phone] Secure chat preferred   Adrianne GORMAN Savers 08/24/2023, 9:40 AM

## 2023-08-25 ENCOUNTER — Other Ambulatory Visit (HOSPITAL_COMMUNITY): Payer: Self-pay

## 2023-08-25 DIAGNOSIS — I10 Essential (primary) hypertension: Secondary | ICD-10-CM

## 2023-08-25 DIAGNOSIS — E872 Acidosis, unspecified: Secondary | ICD-10-CM | POA: Diagnosis not present

## 2023-08-25 LAB — COMPREHENSIVE METABOLIC PANEL WITH GFR
ALT: 16 U/L (ref 0–44)
AST: 20 U/L (ref 15–41)
Albumin: 2.8 g/dL — ABNORMAL LOW (ref 3.5–5.0)
Alkaline Phosphatase: 36 U/L — ABNORMAL LOW (ref 38–126)
Anion gap: 7 (ref 5–15)
BUN: 9 mg/dL (ref 8–23)
CO2: 22 mmol/L (ref 22–32)
Calcium: 8.8 mg/dL — ABNORMAL LOW (ref 8.9–10.3)
Chloride: 110 mmol/L (ref 98–111)
Creatinine, Ser: 1.13 mg/dL — ABNORMAL HIGH (ref 0.44–1.00)
GFR, Estimated: 52 mL/min — ABNORMAL LOW (ref >60)
Glucose, Bld: 113 mg/dL — ABNORMAL HIGH (ref 70–99)
Potassium: 4 mmol/L (ref 3.5–5.1)
Sodium: 139 mmol/L (ref 135–145)
Total Bilirubin: 1.1 mg/dL (ref 0.0–1.2)
Total Protein: 5.5 g/dL — ABNORMAL LOW (ref 6.5–8.1)

## 2023-08-25 LAB — CBC WITH DIFFERENTIAL/PLATELET
Abs Immature Granulocytes: 0.01 K/uL (ref 0.00–0.07)
Basophils Absolute: 0 K/uL (ref 0.0–0.1)
Basophils Relative: 0 %
Eosinophils Absolute: 0.3 K/uL (ref 0.0–0.5)
Eosinophils Relative: 5 %
HCT: 29.2 % — ABNORMAL LOW (ref 36.0–46.0)
Hemoglobin: 9.8 g/dL — ABNORMAL LOW (ref 12.0–15.0)
Immature Granulocytes: 0 %
Lymphocytes Relative: 24 %
Lymphs Abs: 1.2 K/uL (ref 0.7–4.0)
MCH: 32.6 pg (ref 26.0–34.0)
MCHC: 33.6 g/dL (ref 30.0–36.0)
MCV: 97 fL (ref 80.0–100.0)
Monocytes Absolute: 0.6 K/uL (ref 0.1–1.0)
Monocytes Relative: 11 %
Neutro Abs: 3 K/uL (ref 1.7–7.7)
Neutrophils Relative %: 60 %
Platelets: 172 K/uL (ref 150–400)
RBC: 3.01 MIL/uL — ABNORMAL LOW (ref 3.87–5.11)
RDW: 12.7 % (ref 11.5–15.5)
WBC: 5.1 K/uL (ref 4.0–10.5)
nRBC: 0 % (ref 0.0–0.2)

## 2023-08-25 LAB — MAGNESIUM: Magnesium: 1.5 mg/dL — ABNORMAL LOW (ref 1.7–2.4)

## 2023-08-25 LAB — GLUCOSE, CAPILLARY
Glucose-Capillary: 100 mg/dL — ABNORMAL HIGH (ref 70–99)
Glucose-Capillary: 108 mg/dL — ABNORMAL HIGH (ref 70–99)

## 2023-08-25 LAB — RETICULOCYTES
Immature Retic Fract: 27.5 % — ABNORMAL HIGH (ref 2.3–15.9)
RBC.: 3.03 MIL/uL — ABNORMAL LOW (ref 3.87–5.11)
Retic Count, Absolute: 67.6 K/uL (ref 19.0–186.0)
Retic Ct Pct: 2.2 % (ref 0.4–3.1)

## 2023-08-25 LAB — IRON AND TIBC
Iron: 69 ug/dL (ref 28–170)
Saturation Ratios: 20 % (ref 10.4–31.8)
TIBC: 347 ug/dL (ref 250–450)
UIBC: 278 ug/dL

## 2023-08-25 LAB — LACTIC ACID, PLASMA: Lactic Acid, Venous: 0.9 mmol/L (ref 0.5–1.9)

## 2023-08-25 LAB — PHOSPHORUS: Phosphorus: 2.5 mg/dL (ref 2.5–4.6)

## 2023-08-25 LAB — VITAMIN B12: Vitamin B-12: >7500 pg/mL — ABNORMAL HIGH (ref 180–914)

## 2023-08-25 LAB — FERRITIN: Ferritin: 44 ng/mL (ref 11–307)

## 2023-08-25 LAB — FOLATE: Folate: 4.5 ng/mL — ABNORMAL LOW (ref >5.9)

## 2023-08-25 MED ORDER — NITROGLYCERIN 0.4 MG SL SUBL: 0.4000 mg | SUBLINGUAL_TABLET | SUBLINGUAL | Status: DC | PRN

## 2023-08-25 MED ORDER — AMLODIPINE BESYLATE 10 MG PO TABS: 10.0000 mg | ORAL_TABLET | Freq: Every day | ORAL | Status: DC

## 2023-08-25 MED ORDER — FENOFIBRATE 54 MG PO TABS: 54.0000 mg | ORAL_TABLET | Freq: Every day | ORAL | Status: DC

## 2023-08-25 MED ORDER — AMLODIPINE BESYLATE 5 MG PO TABS
5.0000 mg | ORAL_TABLET | Freq: Once | ORAL | Status: AC
  Administered 2023-08-25: 5 mg via ORAL
  Filled 2023-08-25: qty 1

## 2023-08-25 MED ORDER — THIAMINE HCL 100 MG PO TABS
100.0000 mg | ORAL_TABLET | Freq: Every day | ORAL | 0 refills | Status: AC
  Filled 2023-08-25: qty 30, 30d supply, fill #0

## 2023-08-25 MED ORDER — ACETAMINOPHEN 325 MG PO TABS
650.0000 mg | ORAL_TABLET | Freq: Four times a day (QID) | ORAL | 0 refills | Status: AC | PRN
  Filled 2023-08-25: qty 20, 3d supply, fill #0

## 2023-08-25 MED ORDER — EZETIMIBE 10 MG PO TABS: 10.0000 mg | ORAL_TABLET | Freq: Every day | ORAL | Status: DC

## 2023-08-25 MED ORDER — TRAZODONE HCL 50 MG PO TABS: 25.0000 mg | ORAL_TABLET | Freq: Every evening | ORAL | Status: DC | PRN

## 2023-08-25 MED ORDER — AMLODIPINE BESYLATE 10 MG PO TABS
10.0000 mg | ORAL_TABLET | Freq: Every day | ORAL | 0 refills | Status: DC
  Filled 2023-08-25: qty 30, 30d supply, fill #0

## 2023-08-25 MED ORDER — FOLIC ACID 1 MG PO TABS
1.0000 mg | ORAL_TABLET | Freq: Every day | ORAL | 0 refills | Status: AC
  Filled 2023-08-25: qty 30, 30d supply, fill #0

## 2023-08-25 MED ORDER — DOCUSATE SODIUM 100 MG PO CAPS
100.0000 mg | ORAL_CAPSULE | Freq: Two times a day (BID) | ORAL | 0 refills | Status: AC | PRN
  Filled 2023-08-25: qty 10, 5d supply, fill #0

## 2023-08-25 MED ORDER — METOPROLOL TARTRATE 25 MG PO TABS
25.0000 mg | ORAL_TABLET | Freq: Two times a day (BID) | ORAL | 0 refills | Status: DC
  Filled 2023-08-25: qty 60, 30d supply, fill #0

## 2023-08-25 MED ORDER — POLYETHYLENE GLYCOL 3350 17 GM/SCOOP PO POWD
17.0000 g | Freq: Every day | ORAL | 0 refills | Status: AC | PRN
Start: 2023-08-25 — End: ?
  Filled 2023-08-25: qty 238, 14d supply, fill #0

## 2023-08-25 MED ORDER — ATORVASTATIN CALCIUM 80 MG PO TABS
80.0000 mg | ORAL_TABLET | Freq: Every day | ORAL | Status: DC
  Administered 2023-08-25: 80 mg via ORAL
  Filled 2023-08-25: qty 1

## 2023-08-25 MED ORDER — MAGNESIUM SULFATE 4 GM/100ML IV SOLN
4.0000 g | Freq: Once | INTRAVENOUS | Status: AC
  Administered 2023-08-25: 4 g via INTRAVENOUS
  Filled 2023-08-25: qty 100

## 2023-08-25 MED ORDER — METOPROLOL TARTRATE 25 MG PO TABS
25.0000 mg | ORAL_TABLET | Freq: Two times a day (BID) | ORAL | Status: DC
  Administered 2023-08-25: 25 mg via ORAL
  Filled 2023-08-25: qty 1

## 2023-08-25 MED ORDER — ICOSAPENT ETHYL 1 G PO CAPS
2.0000 g | ORAL_CAPSULE | Freq: Two times a day (BID) | ORAL | Status: DC
  Administered 2023-08-25: 2 g via ORAL
  Filled 2023-08-25 (×2): qty 2

## 2023-08-25 MED ORDER — IRBESARTAN 150 MG PO TABS
150.0000 mg | ORAL_TABLET | Freq: Every day | ORAL | Status: DC
  Administered 2023-08-25: 150 mg via ORAL
  Filled 2023-08-25: qty 1

## 2023-08-25 MED ORDER — FOLIC ACID 1 MG PO TABS
1.0000 mg | ORAL_TABLET | Freq: Every day | ORAL | Status: DC
  Administered 2023-08-25: 1 mg via ORAL
  Filled 2023-08-25: qty 1

## 2023-08-26 LAB — CULTURE, BLOOD (ROUTINE X 2)
Culture: NO GROWTH
Culture: NO GROWTH
Special Requests: ADEQUATE
Special Requests: ADEQUATE

## 2023-08-27 NOTE — Discharge Summary (Signed)
 Physician Discharge Summary   Patient: Dominique Hayes MRN: [id number] DOB: 29-Mar-1950  Admit date:     08/21/2023  Discharge date: 08/25/2023  Discharge Physician: Alejandro Marker, DO   PCP: Antonio Cyndee Jamee JONELLE, DO   Recommendations at discharge:   Follow-up with PCP within 1 to 2 weeks repeat CBC, CMP, mag, Phos within 1 week  Discharge Diagnoses: Principal Problem:   Sepsis (HCC) Active Problems:   Lactic acidosis  Resolved Problems:   * No resolved hospital problems. [facility] Course: Patient is a 73 yo F w/ pertinent PMH DMT2 on metformin , HTN, HLD presents to [facility] ED on 8/5 w/ sepsis/LA.   Patient admitted last year on 08/2022 w/ n/v poor po intake. This visit Found to be hypoglycemic and had possible uti. She has been having poor po intake over the last few days w/ n/v/d, abdominal pain. States she mixed some yuka w/ coconut milk. Denies any sick contacts. Came to [facility] ED on 8/5 for further eval. Afebrile, mildly hypertensive, and tachy 110s. Cbg 164. UA w/ moderate leukocytes. CXR clear. CT abd/pelvis cholelithiasis and choledocholithiasis without acute cholecystitis. LA >9. Cultures obtained and given iv fluids. Placed on broad spectrum abx. Vbg 6.8, 19, 100, 3.6. Given bicarb. LA remains >9 despite fluids. PCCM consulted for icu admission.  Transferred to [facility] 8/7.  Patient has no real infectious symptoms we will monitor off of antibiotics.  Lactic acid had initially resolved but then quickly elevated again so was given more IV fluid hydration.  Fluids were continued for another day and PT OT recommended no follow-up.  Her lactic acidosis resolved and normalized.  She is medically stable and her metformin  has been discontinued off her MAR.  Her diabetes management can be discussed with her PCP as her hemoglobin A1c was over 6.5.  She is medically stable for discharge at this time and will need to follow-up with PCP within 1 to 2 weeks.  Assessment and Plan:  Severe Metabolic  acidosis with ongoing Severe Lactic Acidosis: Metabolic Acidosis has resolved as she now has a CO2 of 30, anion gap of 12, chloride level of 98.  Continues to have a lactic acidosis though. Favor metformin  use PTA. Some slight concern for possible cyanide poisong given yuka consumption (s/p 1 dose cyanokit  8/5). Volatile alcohol panel and ethanol negative.  C/w IVF Hydration as below. Current LA Trend:  Recent Labs  Lab 08/22/23 0944 08/22/23 1119 08/22/23 1451 08/23/23 0916 08/23/23 1155 08/24/23 0439 08/25/23 0454  LATICACIDVEN 7.8* 6.7* 7.5* 1.9 5.8* 2.4* 0.9  -Repeat normalized and she is medically safe for discharge as PT/OT recommending No F/U  Possible sepsis, ruled Out: Possible UTI vs GI source given hx of n/v/d; gastroenteritis? However UTI unlikely -Continue IV fluids as below and Bicarbonate discontinued.  -Trend lactic acid as above -PCT was 1.13 and improved to 0.67 -WBC Trend:  Recent Labs  Lab 08/21/23 1207 08/21/23 1737 08/22/23 0457 08/23/23 0458 08/24/23 0439 08/25/23 0454  WBC 19.8* 30.0* 17.1* 8.6 7.3 5.1  -IV Zosyn  discontinued and will Monitor off Abx -Blood Cx x2 showing NGTD @ 5 Days. GI Pathogen panel never sent but C Diff Negative. Send GI Pathogen panel if having significant diarrhea.  -U/A not really infectious and Urine Cx showing No Growth   AKI on CKD Stage 3b: Improved BUN/Cr Trend: Recent Labs  Lab 08/21/23 1207 08/22/23 0052 08/22/23 1119 08/23/23 0458 08/24/23 0439 08/25/23 0454  BUN 23 24* 28* 24* 12 9  CREATININE 1.98* 1.68* 1.73* 1.69* 1.37* 1.13*  - IV fluid hydration is now stopped; CTM  UOP andAvoid Nephrotoxic Medications, Contrast Dyes, Hypotension and Dehydration to Ensure Adequate Renal Perfusion and will need to Renally Adjust Meds. CTM and Trend Renal Function carefully and repeat CMP in the AM   Chromaturia: In the setting of the patient receiving Cyanokit ; Urine is Grape Juice color. Repeat U/A sent and showed a clear  appearance with pink color but was noted to be tested for hemoglobin, leukocytes, nitrites.  Rare bacteria was noted with 0-5 RBCs per high-power field, 0-5 squamous epithelial cells and 0-5 WBCs.  Her discoloration of her urine can likely last weeks.    Abnormal LFTs: Mild and Resolved. AST/ALT Trend:  Recent Labs  Lab 08/21/23 1207 08/22/23 1119 08/23/23 0916 08/24/23 0439 08/25/23 0454  AST 45* 40 28 26 20   ALT 45* 27 19 20 16   -RUQ US  with numerous gallstones but no cholecystitis. Cholelithiasis and choledocholithiasis without acute cholecystitis. C/w Supportive care. -Trend LFT's intermittently now   Diabetes Mellitus Type 2: Stop Metformin  altogether in the outpatient setting. Beta-Hydroxybutryic Acid was 3.66. C/w Sensitive Novolog  SSI sq q4h. CTM CBGs per protocol and current trend:  Recent Labs  Lab 08/24/23 0000 08/24/23 0745 08/24/23 1231 08/24/23 1602 08/24/23 2130 08/25/23 0724 08/25/23 1152  GLUCAP 176* 106* 145* 123* 150* 100* 108*  -HbA1c of 6.7. Will need different Med than Metformin  at D/C but since HbA1c isn't terrible likely can control with Diet    Essential HTN: Held BP Meds. C/w Labetalol  5-20 mg IV q10 min PRN HBP for 10 doses. CTM BP per Protocol. Last BP reading was improved prior to discharge  HLD: Hold home meds for now  HypoMag: Mag Level 1.5. Replete w/ IV Mag Sulfate 2 grams. CTM and Replete as Necessary. Repeat Mag Level w/in 1 week   Hypophosphatemia: Phos Level is now 2.5. Replete w/ IV K Phos  20 mmol yesterday. CTM and Replete as Necessary. Repeat CMP in the AM  Hyperbilirubinemia: Mild and initially worsened but now slowly improving and resolved. Tbili Trend improving:   Recent Labs  Lab 08/21/23 1207 08/22/23 1119 08/23/23 0916 08/24/23 0439 08/25/23 0454  BILITOT 0.4 2.5* 1.9* 1.4* 1.1  -RUQ U/S as above. CTM and Trend and repeat CMP in the AM    Normocytic Anemia / Anemia of Chronic Disease: Likely was Hemoconcentrated on  Admission. Hgb/Hct Trend:  Recent Labs  Lab 08/21/23 1207 08/21/23 1421 08/21/23 1737 08/22/23 0457 08/23/23 0458 08/24/23 0439 08/25/23 0454  HGB 12.4 11.6* 11.5* 9.1* 9.7* 11.3* 9.8*  HCT 40.1 34.0* 38.9 26.5* 28.6* 33.6* 29.2*  MCV 103.6*  --  110.2* 96.7 96.6 98.0 97.0  -Checked Anemia Panel and showed an iron level of 69, UIBC of 278, TIBC 347, saturation ratios of 20%, ferritin level 44, folate level 4.5, vitamin B12 level greater than 7500. CTM for S/Sx of Bleeding and Transfuse for Hgb < 7; No overt bleeding noted. Repeat CBC within 1 week  Hypoalbuminemia: Patient's Albumin Lvl went from 4.8 -> 3.2 -> 3.0 -> 3.2 and dropped to 2.8 at the time of discharge. CTM and Trend and repeat CMP in the AM   Consultants: PCCM transfer Procedures performed: As delineated as above Disposition: Home Diet recommendation:  Discharge Diet Orders (From admission, onward)     Start     Ordered   08/25/23 0000  Diet - low sodium heart healthy        08/25/23 1457  Cardiac diet DISCHARGE MEDICATION: Allergies as of 08/25/2023   No Known Allergies      Medication List     PAUSE taking these medications    chlorthalidone  25 MG tablet Wait to take this until your doctor or other care provider tells you to start again. Commonly known as: HYGROTON  Take 1 tablet (25 mg total) by mouth daily.       STOP taking these medications    ezetimibe  10 MG tablet Commonly known as: Zetia    fenofibrate  48 MG tablet Commonly known as: Tricor        TAKE these medications    acetaminophen  325 MG tablet Commonly known as: TYLENOL  U?ng 2 vin (t?ng c?ng 650 mg) m?i 6 (su) gi? khi c?n gi?m ?au nh? (?i?m ?au 1-3) ho?c s?t (nhi?t ?? > 101,5). (Take 2 tablets (650 mg total) by mouth every 6 (six) hours as needed for mild pain (pain score 1-3) or fever (temp > 101.5).)   amLODipine  10 MG tablet Commonly known as: NORVASC  Ngy u?ng 1 l?n, m?i l?n 1 vin (t?ng c?ng 10 mg). (Take  1 tablet (10 mg total) by mouth daily.) What changed:  medication strength how much to take   atorvastatin  80 MG tablet Commonly known as: LIPITOR Take 1 tablet (80 mg total) by mouth daily.   docusate sodium  100 MG capsule Commonly known as: COLACE U?ng 1 vin nang (t?ng c?ng 100 mg) 2 (hai) l?n m?i ngy khi c?n thi?t ?? ?i?u tr? to bn nh?. (Take 1 capsule (100 mg total) by mouth 2 (two) times daily as needed for mild constipation.)   folic acid  1 MG tablet Commonly known as: FOLVITE  Ngy u?ng 1 l?n, m?i l?n 1 vin (t?ng c?ng 1 mg). (Take 1 tablet (1 mg total) by mouth daily.)   icosapent  Ethyl 1 g capsule Commonly known as: Vascepa  TAKE 2 CAPSULES BY MOUTH 2 TIMES DAILY.   metoprolol  tartrate 25 MG tablet Commonly known as: LOPRESSOR  U?ng 1 vin (t?ng c?ng 25 mg) 2 (hai) l?n m?i ngy. (Take 1 tablet (25 mg total) by mouth 2 (two) times daily.)   nitroGLYCERIN  0.4 MG SL tablet Commonly known as: NITROSTAT  Place 1 tablet (0.4 mg total) under the tongue every 5 (five) minutes as needed for chest pain.   olmesartan  20 MG tablet Commonly known as: BENICAR  Take 1 tablet (20 mg total) by mouth daily.   polyethylene glycol powder 17 GM/SCOOP powder Commonly known as: GLYCOLAX /MIRALAX  Tr?n m?t mu?ng (17 g) vo 8 oz ch?t l?ng v u?ng hng ngy khi c?n thi?t ?? ?i?u tr? to bn v?a ph?i. (mix one scoop  (17 g) in 8 oz liquid and take by mouth daily as needed for moderate constipation.)   thiamine  100 MG tablet Commonly known as: VITAMIN B1 Ngy u?ng 1 l?n, m?i l?n 1 vin (t?ng c?ng 100 mg). (Take 1 tablet (100 mg total) by mouth daily.)   traZODone  50 MG tablet Commonly known as: DESYREL  Take 0.5-1 tablets (25-50 mg total) by mouth at bedtime as needed for sleep.        Discharge Exam: Filed Weights   08/24/23 0408 08/24/23 0425 08/25/23 9356  Weight: 54.4 kg 53.7 kg 53.3 kg   Vitals:   08/25/23 1339 08/25/23 1442  BP: (!) 170/88 (!) 151/80  Pulse: 89   Resp:     Temp:    SpO2:     Examination: Physical Exam:  Constitutional: WN/WD, [place] female in no acute distress Respiratory: Diminished to auscultation bilaterally, no  wheezing, rales, rhonchi or crackles. Normal respiratory effort and patient is not tachypenic. No accessory muscle use.  Unlabored breathing Cardiovascular: RRR, no murmurs / rubs / gallops. S1 and S2 auscultated. No extremity edema.  Abdomen: Soft, non-tender, non-distended. Bowel sounds positive.  GU: Deferred. Musculoskeletal: No clubbing / cyanosis of digits/nails. No joint deformity upper and lower extremities.  Skin: No rashes, lesions, ulcers on limited skin evaluation. No induration; Warm and dry.  Neurologic: CN 2-12 grossly intact with no focal deficits. Romberg sign and cerebellar reflexes not assessed.  Psychiatric: Normal judgment and insight. Alert and oriented x 3. Normal mood and appropriate affect.   Condition at discharge: stable  The results of significant diagnostics from this hospitalization (including imaging, microbiology, ancillary and laboratory) are listed below for reference.   Imaging Studies: ECHOCARDIOGRAM COMPLETE Result Date: 08/22/2023    ECHOCARDIOGRAM REPORT   Patient Name:   AYSLIN KUNDERT Date of Exam: 08/22/2023 Medical Rec #:  [id number]  Height:       60.0 in Accession #:    [id number] Weight:       127.0 lb Date of Birth:  [birth year]  BSA:          1.539 m Patient Age:    72 years   BP:           180/100 mmHg Patient Gender: F          HR:           73 bpm. Exam Location:  Inpatient Procedure: 2D Echo, Cardiac Doppler and Color Doppler (Both Spectral and Color            Flow Doppler were utilized during procedure). Indications:   Lactic acidosis  History:       Patient has no prior history of Echocardiogram examinations.                Sepsis.  Sonographer:   Benard Stallion Referring      [phone] RAHUL P DESAI Phys: IMPRESSIONS  1. Left ventricular ejection fraction, by estimation, is 60 to  65%. The left ventricle has normal function. The left ventricle has no regional wall motion abnormalities. Left ventricular diastolic parameters are consistent with Grade I diastolic dysfunction (impaired relaxation).  2. Right ventricular systolic function is normal. The right ventricular size is normal. There is mildly elevated pulmonary artery systolic pressure. The estimated right ventricular systolic pressure is 45.0 mmHg.  3. A small pericardial effusion is present.  4. The mitral valve is normal in structure. Trivial mitral valve regurgitation. No evidence of mitral stenosis.  5. The aortic valve is tricuspid. There is mild calcification of the aortic valve. Aortic valve regurgitation is not visualized. No aortic stenosis is present.  6. The inferior vena cava is normal in size with greater than 50% respiratory variability, suggesting right atrial pressure of 3 mmHg. FINDINGS  Left Ventricle: Left ventricular ejection fraction, by estimation, is 60 to 65%. The left ventricle has normal function. The left ventricle has no regional wall motion abnormalities. The left ventricular internal cavity size was normal in size. There is  no left ventricular hypertrophy. Left ventricular diastolic parameters are consistent with Grade I diastolic dysfunction (impaired relaxation). Right Ventricle: The right ventricular size is normal. No increase in right ventricular wall thickness. Right ventricular systolic function is normal. There is mildly elevated pulmonary artery systolic pressure. The tricuspid regurgitant velocity is 3.24  m/s, and with an assumed right atrial pressure of 3 mmHg, the estimated right  ventricular systolic pressure is 45.0 mmHg. Left Atrium: Left atrial size was normal in size. Right Atrium: Right atrial size was normal in size. Pericardium: A small pericardial effusion is present. Presence of epicardial fat layer. Mitral Valve: The mitral valve is normal in structure. Trivial mitral valve  regurgitation. No evidence of mitral valve stenosis. Tricuspid Valve: The tricuspid valve is grossly normal. Tricuspid valve regurgitation is mild . No evidence of tricuspid stenosis. Aortic Valve: The aortic valve is tricuspid. There is mild calcification of the aortic valve. Aortic valve regurgitation is not visualized. No aortic stenosis is present. Aortic valve mean gradient measures 7.5 mmHg. Aortic valve peak gradient measures 13.4 mmHg. Aortic valve area, by VTI measures 1.58 cm. Pulmonic Valve: The pulmonic valve was normal in structure. Pulmonic valve regurgitation is trivial. No evidence of pulmonic stenosis. Aorta: The aortic root is normal in size and structure. Venous: The inferior vena cava is normal in size with greater than 50% respiratory variability, suggesting right atrial pressure of 3 mmHg. IAS/Shunts: No atrial level shunt detected by color flow Doppler.  LEFT VENTRICLE PLAX 2D LVIDd:         3.60 cm   Diastology LVIDs:         2.40 cm   LV e' medial:    9.36 cm/s LV PW:         0.90 cm   LV E/e' medial:  10.6 LV IVS:        0.90 cm   LV e' lateral:   7.07 cm/s LVOT diam:     1.80 cm   LV E/e' lateral: 14.1 LV SV:         56 LV SV Index:   36 LVOT Area:     2.54 cm  RIGHT VENTRICLE RV Basal diam:  3.10 cm RV Mid diam:    2.30 cm RV S prime:     12.70 cm/s TAPSE (M-mode): 2.0 cm LEFT ATRIUM             Index        RIGHT ATRIUM           Index LA diam:        3.10 cm 2.01 cm/m   RA Area:     17.20 cm LA Vol (A2C):   55.6 ml 36.13 ml/m  RA Volume:   41.00 ml  26.64 ml/m LA Vol (A4C):   46.1 ml 29.96 ml/m LA Biplane Vol: 51.2 ml 33.27 ml/m  AORTIC VALVE AV Area (Vmax):    1.53 cm AV Area (Vmean):   1.51 cm AV Area (VTI):     1.58 cm AV Vmax:           183.00 cm/s AV Vmean:          125.500 cm/s AV VTI:            0.353 m AV Peak Grad:      13.4 mmHg AV Mean Grad:      7.5 mmHg LVOT Vmax:         110.00 cm/s LVOT Vmean:        74.400 cm/s LVOT VTI:          0.219 m LVOT/AV VTI ratio:  0.62  AORTA Ao Root diam: 2.70 cm Ao Asc diam:  3.60 cm MITRAL VALVE                TRICUSPID VALVE MV Area (PHT): 4.49 cm     TR Peak grad:  42.0 mmHg MV Decel Time: 169 msec     TR Vmax:        324.00 cm/s MV E velocity: 99.40 cm/s MV A velocity: 120.00 cm/s  SHUNTS MV E/A ratio:  0.83         Systemic VTI:  0.22 m                             Systemic Diam: 1.80 cm Soyla Merck MD Electronically signed by Soyla Merck MD Signature Date/Time: 08/22/2023/9:41:45 PM    Final    US  Abdomen Limited RUQ (LIVER/GB) Result Date: 08/21/2023 CLINICAL DATA:  Abdominal pain. EXAM: ULTRASOUND ABDOMEN LIMITED RIGHT UPPER QUADRANT COMPARISON:  CT earlier today.  Ultrasound 12/24/2022 FINDINGS: Gallbladder: Physiologically distended. Multiple intraluminal gallstones. There is a stone in the gallbladder neck. Upper normal gallbladder wall thickness at 2.8 mm. No pericholecystic fluid. No sonographic Murphy sign noted by sonographer. Common bile duct: Diameter: 9 mm. This stone in the distal common bile duct on CT is not definitively seen. Liver: No focal lesion identified. Within normal limits in parenchymal echogenicity. Portal vein is patent on color Doppler imaging with normal direction of blood flow towards the liver. Other: No right upper quadrant ascites. IMPRESSION: 1. Numerous gallstones including a stone in the gallbladder neck. No sonographic findings of acute cholecystitis. 2. Dilated common bile duct at 9 mm. Choledocholithiasis on CT is not well demonstrated on the current exam. Electronically Signed   By: Andrea Gasman M.D.   On: 08/21/2023 22:32   DG Chest Portable 1 View Result Date: 08/21/2023 CLINICAL DATA:  Sepsis EXAM: PORTABLE CHEST 1 VIEW COMPARISON:  09/09/2022 FINDINGS: Mild cardiomegaly. Aortic atherosclerosis. No confluent opacities, effusions or edema. No acute bony abnormality. IMPRESSION: Cardiomegaly.  No active disease. Electronically Signed   By: Franky Crease M.D.   On: 08/21/2023 15:10    CT ABDOMEN PELVIS WO CONTRAST Result Date: 08/21/2023 CLINICAL DATA:  Abdominal pain, vomiting. EXAM: CT ABDOMEN AND PELVIS WITHOUT CONTRAST TECHNIQUE: Multidetector CT imaging of the abdomen and pelvis was performed following the standard protocol without IV contrast. RADIATION DOSE REDUCTION: This exam was performed according to the departmental dose-optimization program which includes automated exposure control, adjustment of the mA and/or kV according to patient size and/or use of iterative reconstruction technique. COMPARISON:  09/09/2022 FINDINGS: Lower chest: No acute abnormality. Hepatobiliary: Layering gallstones within the gallbladder. Stone noted within the distal common bile duct measuring 3-4 mm. Common bile duct slightly dilated at 9 mm. No intrahepatic biliary ductal dilatation or focal hepatic abnormality. No CT evidence of acute cholecystitis. Pancreas: No focal abnormality or ductal dilatation. Spleen: No focal abnormality.  Normal size. Adrenals/Urinary Tract: No adrenal abnormality. No focal renal abnormality. No stones or hydronephrosis. Urinary bladder is unremarkable. Stomach/Bowel: Stomach, large and small bowel grossly unremarkable. Vascular/Lymphatic: Aortic atherosclerosis. No evidence of aneurysm or adenopathy. Reproductive: Prior hysterectomy.  No adnexal masses. Other: No free fluid or free air. Musculoskeletal: No acute bony abnormality. IMPRESSION: Cholelithiasis and choledocholithiasis. Common bile duct mildly dilated at 9 mm. No CT evidence for acute cholecystitis. Aortic atherosclerosis. Electronically Signed   By: Franky Crease M.D.   On: 08/21/2023 15:09   Microbiology: Results for orders placed or performed during the hospital encounter of 08/21/23  Blood culture (routine x 2)     Status: None   Collection Time: 08/21/23 12:55 PM   Specimen: BLOOD  Result Value Ref Range Status   Specimen Description  Final    BLOOD HAND LEFT Performed at [facility],  [address] [place], [place] [address]    Special Requests   Final    BOTTLES DRAWN AEROBIC AND ANAEROBIC Blood Culture adequate volume Performed at [facility], [address] [place], [place] [address]    Culture   Final    NO GROWTH 5 DAYS Performed at [facility], [address] [place], [place] [address]    Report Status 08/26/2023 FINAL  Final  Blood culture (routine x 2)     Status: None   Collection Time: 08/21/23  1:00 PM   Specimen: BLOOD  Result Value Ref Range Status   Specimen Description   Final    BLOOD RIGHT HAND Performed at [facility], [address] [place], [place] [address]    Special Requests   Final    BOTTLES DRAWN AEROBIC ONLY Blood Culture adequate volume Performed at [facility], [address] [place], [place] [address]    Culture   Final    NO GROWTH 5 DAYS Performed at [facility], [address], [place], [place] [address]    Report Status 08/26/2023 FINAL  Final  Urine Culture     Status: None   Collection Time: 08/21/23  3:17 PM   Specimen: Urine, Clean Catch  Result Value Ref Range Status   Specimen Description   Final    URINE, CLEAN CATCH Performed at [facility], [address] [place], [place] [address]    Special Requests   Final    NONE Performed at [facility], [address] [place], [place] [address]    Culture   Final    NO GROWTH Performed at [facility], [address], [place], [place] [address]    Report Status 08/22/2023 FINAL  Final  C Difficile Quick Screen w PCR reflex     Status: None   Collection Time: 08/23/23  3:45 AM   Specimen: STOOL  Result Value Ref Range Status   C Diff antigen NEGATIVE NEGATIVE Final   C Diff toxin NEGATIVE NEGATIVE Final   C Diff interpretation No C. difficile detected.  Final    Comment: Performed at [facility], [address] [place], [place] [address]   Labs: CBC: Recent Labs   Lab 08/21/23 1737 08/22/23 0457 08/23/23 0458 08/24/23 0439 08/25/23 0454  WBC 30.0* 17.1* 8.6 7.3 5.1  NEUTROABS  --   --   --  4.7 3.0  HGB 11.5* 9.1* 9.7* 11.3* 9.8*  HCT 38.9 26.5* 28.6* 33.6* 29.2*  MCV 110.2* 96.7 96.6 98.0 97.0  PLT 356 220 157 202 172   Basic Metabolic Panel: Recent Labs  Lab 08/22/23 0052 08/22/23 1119 08/23/23 0458 08/24/23 0439 08/25/23 0454  NA 141 137 140 141 139  K 4.0 3.4* 3.6 3.5 4.0  CL 101 93* 98 105 110  CO2 14* 29 30 25 22   GLUCOSE 97 140* 105* 111* 113*  BUN 24* 28* 24* 12 9  CREATININE 1.68* 1.73* 1.69* 1.37* 1.13*  CALCIUM  8.0* 8.2* 8.5* 8.8* 8.8*  MG 1.3*  --  2.7* 1.9 1.5*  PHOS  --   --  1.6* 2.5 2.5   Liver Function Tests: Recent Labs  Lab 08/21/23 1207 08/22/23 1119 08/23/23 0916 08/24/23 0439 08/25/23 0454  AST 45* 40 28 26 20   ALT  45* 27 19 20 16   ALKPHOS 66 35* 38 39 36*  BILITOT 0.4 2.5* 1.9* 1.4* 1.1  PROT 8.2* 5.8* 5.8* 6.3* 5.5*  ALBUMIN 4.8 3.2* 3.0* 3.2* 2.8*   CBG: Recent Labs  Lab 08/24/23 1231 08/24/23 1602 08/24/23 2130 08/25/23 0724 08/25/23 1152  GLUCAP 145* 123* 150* 100* 108*   Discharge time spent: greater than 30 minutes.  Signed: Alejandro Marker, DO [facility] 08/27/2023

## 2023-08-28 ENCOUNTER — Encounter: Payer: Self-pay | Admitting: Family Medicine

## 2023-08-28 ENCOUNTER — Telehealth: Payer: Self-pay

## 2023-08-28 ENCOUNTER — Ambulatory Visit (INDEPENDENT_AMBULATORY_CARE_PROVIDER_SITE_OTHER): Admitting: Family Medicine

## 2023-08-28 ENCOUNTER — Ambulatory Visit: Payer: Self-pay | Admitting: Family Medicine

## 2023-08-28 ENCOUNTER — Other Ambulatory Visit: Payer: Self-pay

## 2023-08-28 VITALS — BP 132/70 | HR 56 | Temp 98.3°F | Resp 18 | Ht 60.0 in | Wt 113.6 lb

## 2023-08-28 DIAGNOSIS — Z8619 Personal history of other infectious and parasitic diseases: Secondary | ICD-10-CM | POA: Diagnosis not present

## 2023-08-28 DIAGNOSIS — N390 Urinary tract infection, site not specified: Secondary | ICD-10-CM

## 2023-08-28 DIAGNOSIS — A419 Sepsis, unspecified organism: Secondary | ICD-10-CM | POA: Diagnosis not present

## 2023-08-28 DIAGNOSIS — E1165 Type 2 diabetes mellitus with hyperglycemia: Secondary | ICD-10-CM

## 2023-08-28 DIAGNOSIS — I10 Essential (primary) hypertension: Secondary | ICD-10-CM | POA: Diagnosis not present

## 2023-08-28 DIAGNOSIS — E785 Hyperlipidemia, unspecified: Secondary | ICD-10-CM | POA: Diagnosis not present

## 2023-08-28 LAB — POC URINALSYSI DIPSTICK (AUTOMATED)
Glucose, UA: NEGATIVE
Ketones, UA: POSITIVE
Nitrite, UA: POSITIVE
Protein, UA: POSITIVE — AB
Spec Grav, UA: 1.015 (ref 1.010–1.025)
Urobilinogen, UA: 0.2 U/dL
pH, UA: 6.5 (ref 5.0–8.0)

## 2023-08-28 NOTE — Assessment & Plan Note (Signed)
 Well controlled, no changes to meds. Encouraged heart healthy diet such as the DASH diet and exercise as tolerated.

## 2023-08-28 NOTE — Assessment & Plan Note (Signed)
 Encourage heart healthy diet such as MIND or DASH diet, increase exercise, avoid trans fats, simple carbohydrates and processed foods, consider a krill or fish or flaxseed oil cap daily.

## 2023-08-28 NOTE — Assessment & Plan Note (Signed)
 Repeat blood work today

## 2023-08-28 NOTE — Progress Notes (Signed)
 Subjective:    Patient ID: Dominique Hayes, female    DOB: 1950/02/19, 73 y.o.   MRN: [id number]  Chief Complaint  Patient presents with   Hospitalization Follow-up    HPI Patient is in today for hosp f/u. Discussed the use of AI scribe software for clinical note transcription with the patient, who gave verbal consent to proceed.  History of Present Illness Dominique Hayes is a 73 year old female who presents for follow-up after hospitalization for vomiting and diarrhea. She is accompanied by her husband.  She was admitted to hospital 8/5-8/9 for severe sepsis with vomiting and diarrhea.  She currently has no vomiting or diarrhea. She is able to eat without stomach discomfort, primarily consuming soup and potatoes.    She continues to take medication prescribed during her hospital stay but is unsure about the duration of this treatment. Her blood pressure was reported to be low at 70 by her family member, but it increases after eating. Her blood sugar was recorded at 70 today.  During her hospital stay, stones were identified in her gallbladder via ultrasound. She has not experienced any pain associated with this finding.      Past Medical History:  Diagnosis Date   DM (diabetes mellitus) (HCC)    Hepatitis B    Hyperlipidemia    Hypertension    Mild diastolic dysfunction     Past Surgical History:  Procedure Laterality Date   ABDOMINAL HYSTERECTOMY     TAH-- complications from D &C    Family History  Problem Relation Age of Onset   Hypertension Mother    Hypertension Father    Hypertension Sister    Hypertension Sister     Social History   Socioeconomic History   Marital status: Married    Spouse name: Not on file   Number of children: 1   Years of education: Not on file   Highest education level: Not on file  Occupational History   Occupation: nail tech  Tobacco Use   Smoking status: Never   Smokeless tobacco: Never  Substance and Sexual Activity   Alcohol  use: No    Alcohol/week: 0.0 standard drinks of alcohol   Drug use: No   Sexual activity: Yes  Other Topics Concern   Not on file  Social History Narrative   Marital status: married      Employment: employed; p/t Chief Strategy Officer   Exercise--- treadmill everday for 30 min   Social Drivers of Corporate investment banker Strain: Not on file  Food Insecurity: Patient Unable To Answer (08/23/2023)   Hunger Vital Sign    Worried About Running Out of Food in the Last Year: Patient unable to answer    Ran Out of Food in the Last Year: Patient unable to answer  Transportation Needs: Patient Unable To Answer (08/23/2023)   PRAPARE - Administrator, Civil Service (Medical): Patient unable to answer    Lack of Transportation (Non-Medical): Patient unable to answer  Physical Activity: Not on file  Stress: Not on file  Social Connections: Moderately Isolated (08/25/2023)   Social Connection and Isolation Panel    Frequency of Communication with Friends and Family: More than three times a week    Frequency of Social Gatherings with Friends and Family: Twice a week    Attends Religious Services: Never    Database administrator or Organizations: No    Attends Banker Meetings: Never  Marital Status: Married  Catering manager Violence: Not At Risk (08/25/2023)   Humiliation, Afraid, Rape, and Kick questionnaire    Fear of Current or Ex-Partner: No    Emotionally Abused: No    Physically Abused: No    Sexually Abused: No    Outpatient Medications Prior to Visit  Medication Sig Dispense Refill   acetaminophen  (TYLENOL ) 325 MG tablet Take 2 tablets (650 mg total) by mouth every 6 (six) hours as needed for mild pain (pain score 1-3) or fever (temp > 101.5). 20 tablet 0   amLODipine  (NORVASC ) 10 MG tablet Take 1 tablet (10 mg total) by mouth daily. 30 tablet 0   atorvastatin  (LIPITOR) 80 MG tablet Take 1 tablet (80 mg total) by mouth daily. 90 tablet 1   chlorthalidone  (HYGROTON )  25 MG tablet Take 1 tablet (25 mg total) by mouth daily. 90 tablet 1   docusate sodium  (COLACE) 100 MG capsule Take 1 capsule (100 mg total) by mouth 2 (two) times daily as needed for mild constipation. 10 capsule 0   folic acid  (FOLVITE ) 1 MG tablet Take 1 tablet (1 mg total) by mouth daily. 30 tablet 0   icosapent  Ethyl (VASCEPA ) 1 g capsule TAKE 2 CAPSULES BY MOUTH 2 TIMES DAILY. 120 capsule 2   metoprolol  tartrate (LOPRESSOR ) 25 MG tablet Take 1 tablet (25 mg total) by mouth 2 (two) times daily. 60 tablet 0   olmesartan  (BENICAR ) 20 MG tablet Take 1 tablet (20 mg total) by mouth daily. 90 tablet 1   polyethylene glycol powder (GLYCOLAX /MIRALAX ) 17 GM/SCOOP powder mix one scoop  (17 g) in 8 oz liquid and take by mouth daily as needed for moderate constipation. 238 g 0   thiamine  (VITAMIN B1) 100 MG tablet Take 1 tablet (100 mg total) by mouth daily. 30 tablet 0   nitroGLYCERIN  (NITROSTAT ) 0.4 MG SL tablet Place 1 tablet (0.4 mg total) under the tongue every 5 (five) minutes as needed for chest pain. (Patient not taking: Reported on 08/28/2023) 30 tablet 0   traZODone  (DESYREL ) 50 MG tablet Take 0.5-1 tablets (25-50 mg total) by mouth at bedtime as needed for sleep. (Patient not taking: Reported on 08/28/2023) 90 tablet 0   No facility-administered medications prior to visit.    No Known Allergies  Review of Systems  Constitutional:  Negative for fever and malaise/fatigue.  HENT:  Negative for congestion.   Eyes:  Negative for blurred vision.  Respiratory:  Negative for shortness of breath.   Cardiovascular:  Negative for chest pain, palpitations and leg swelling.  Gastrointestinal:  Negative for abdominal pain, blood in stool and nausea.  Genitourinary:  Negative for dysuria and frequency.  Musculoskeletal:  Negative for falls.  Skin:  Negative for rash.  Neurological:  Negative for dizziness, loss of consciousness and headaches.  Endo/Heme/Allergies:  Negative for environmental  allergies.  Psychiatric/Behavioral:  Negative for depression. The patient is not nervous/anxious.        Objective:    Physical Exam Vitals and nursing note reviewed.  Constitutional:      General: She is not in acute distress.    Appearance: Normal appearance. She is well-developed.  HENT:     Head: Normocephalic and atraumatic.  Eyes:     General: No scleral icterus.       Right eye: No discharge.        Left eye: No discharge.  Cardiovascular:     Rate and Rhythm: Normal rate and regular rhythm.  Heart sounds: No murmur heard. Pulmonary:     Effort: Pulmonary effort is normal. No respiratory distress.     Breath sounds: Normal breath sounds.  Musculoskeletal:        General: Normal range of motion.     Cervical back: Normal range of motion and neck supple.     Right lower leg: No edema.     Left lower leg: No edema.  Skin:    General: Skin is warm and dry.  Neurological:     Mental Status: She is alert and oriented to person, place, and time.  Psychiatric:        Mood and Affect: Mood normal.        Behavior: Behavior normal.        Thought Content: Thought content normal.        Judgment: Judgment normal.     BP 132/70 (BP Location: Right Arm, Patient Position: Sitting, Cuff Size: Normal)   Pulse (!) 56   Temp 98.3 F (36.8 C) (Oral)   Resp 18   Ht 5' (1.524 m)   Wt 113 lb 9.6 oz (51.5 kg)   SpO2 99%   BMI 22.19 kg/m  Wt Readings from Last 3 Encounters:  08/28/23 113 lb 9.6 oz (51.5 kg)  08/25/23 117 lb 8.1 oz (53.3 kg)  05/22/23 119 lb 12.8 oz (54.3 kg)    Diabetic Foot Exam - Simple   Simple Foot Form Diabetic Foot exam was performed with the following findings: Yes 08/28/2023  9:17 AM  Visual Inspection No deformities, no ulcerations, no other skin breakdown bilaterally: Yes Sensation Testing Intact to touch and monofilament testing bilaterally: Yes Pulse Check Posterior Tibialis and Dorsalis pulse intact bilaterally: Yes Comments    Lab  Results  Component Value Date   WBC 5.1 08/25/2023   HGB 9.8 (L) 08/25/2023   HCT 29.2 (L) 08/25/2023   PLT 172 08/25/2023   GLUCOSE 113 (H) 08/25/2023   CHOL 216 (H) 05/22/2023   TRIG 199.0 (H) 05/22/2023   HDL 39.10 05/22/2023   LDLDIRECT 137.0 03/30/2022   LDLCALC 137 (H) 05/22/2023   ALT 16 08/25/2023   AST 20 08/25/2023   NA 139 08/25/2023   K 4.0 08/25/2023   CL 110 08/25/2023   CREATININE 1.13 (H) 08/25/2023   BUN 9 08/25/2023   CO2 22 08/25/2023   TSH 1.05 05/26/2013   HGBA1C 6.7 (H) 05/22/2023   MICROALBUR 1.4 05/22/2023    Lab Results  Component Value Date   TSH 1.05 05/26/2013   Lab Results  Component Value Date   WBC 5.1 08/25/2023   HGB 9.8 (L) 08/25/2023   HCT 29.2 (L) 08/25/2023   MCV 97.0 08/25/2023   PLT 172 08/25/2023   Lab Results  Component Value Date   NA 139 08/25/2023   K 4.0 08/25/2023   CO2 22 08/25/2023   GLUCOSE 113 (H) 08/25/2023   BUN 9 08/25/2023   CREATININE 1.13 (H) 08/25/2023   BILITOT 1.1 08/25/2023   ALKPHOS 36 (L) 08/25/2023   AST 20 08/25/2023   ALT 16 08/25/2023   PROT 5.5 (L) 08/25/2023   ALBUMIN 2.8 (L) 08/25/2023   CALCIUM  8.8 (L) 08/25/2023   ANIONGAP 7 08/25/2023   GFR 30.61 (L) 05/22/2023   Lab Results  Component Value Date   CHOL 216 (H) 05/22/2023   Lab Results  Component Value Date   HDL 39.10 05/22/2023   Lab Results  Component Value Date   LDLCALC 137 (H) 05/22/2023  Lab Results  Component Value Date   TRIG 199.0 (H) 05/22/2023   Lab Results  Component Value Date   CHOLHDL 6 05/22/2023   Lab Results  Component Value Date   HGBA1C 6.7 (H) 05/22/2023       Assessment & Plan:  Urinary tract infection without hematuria, site unspecified  Hyperlipidemia LDL goal <70 Assessment & Plan: Encourage heart healthy diet such as MIND or DASH diet, increase exercise, avoid trans fats, simple carbohydrates and processed foods, consider a krill or fish or flaxseed oil cap daily.    Orders: -      Comprehensive metabolic panel with GFR -     Lipid panel  Primary hypertension -     CBC with Differential/Platelet -     Comprehensive metabolic panel with GFR -     Lipid panel -     TSH  Uncontrolled type 2 diabetes mellitus with hyperglycemia (HCC) Assessment & Plan: Lab Results  Component Value Date   HGBA1C 6.7 (H) 05/22/2023   Bs doing better since d/c    Hypomagnesemia -     Magnesium   History of sepsis -     CBC with Differential/Platelet -     Comprehensive metabolic panel with GFR -     Phosphorus -     POCT Urinalysis Dipstick (Automated)  Hyperlipidemia, unspecified hyperlipidemia type  Essential hypertension Assessment & Plan: Well controlled, no changes to meds. Encouraged heart healthy diet such as the DASH diet and exercise as tolerated.     Sepsis, due to unspecified organism, unspecified whether acute organ dysfunction present [facility]) Assessment & Plan: Repeat blood work today   Assessment and Plan Assessment & Plan Cholelithiasis without current symptoms   Cholelithiasis was identified during a hospital visit with gallstones present but asymptomatic. Monitor for symptoms such as upper abdominal pain and report any new symptoms immediately.  Recent resolved gastroenteritis   A recent episode of gastroenteritis with vomiting and diarrhea has resolved. There are no current gastrointestinal complaints. Continue current medications from the hospital until completion and refill as needed.  Status post right eye cataract surgery and planned left eye cataract surgery   She is status post right eye cataract surgery. Left eye cataract surgery is planned in one month. Schedule a follow-up with the ophthalmologist for the left eye cataract surgery.  Intermittent hypotension   Intermittent hypotension is noted with a recent blood pressure reading of 70 mmHg, which increases postprandially.    Sebron Mcmahill R Lowne Chase, DO

## 2023-08-28 NOTE — Transitions of Care (Post Inpatient/ED Visit) (Signed)
   08/28/2023  Name: Dominique Hayes MRN: [id number] DOB: 07/25/1950  Today's TOC FU Call Status: Today's TOC FU Call Status:: Unsuccessful Call (2nd Attempt) Unsuccessful Call (2nd Attempt) Date: 08/28/23  Attempted to reach the patient regarding the most recent Inpatient/ED visit.  Follow Up Plan: Additional outreach attempts will be made to reach the patient to complete the Transitions of Care (Post Inpatient/ED visit) call.   Medford Balboa, BSN, RN [place]  [facility] - [facility] RN Care Manager [phone]

## 2023-08-28 NOTE — Assessment & Plan Note (Signed)
 Lab Results  Component Value Date   HGBA1C 6.7 (H) 05/22/2023   Bs doing better since d/c

## 2023-08-29 ENCOUNTER — Telehealth: Payer: Self-pay

## 2023-08-29 LAB — COMPREHENSIVE METABOLIC PANEL WITH GFR
ALT: 19 U/L (ref 0–35)
AST: 29 U/L (ref 0–37)
Albumin: 3.9 g/dL (ref 3.5–5.2)
Alkaline Phosphatase: 42 U/L (ref 39–117)
BUN: 19 mg/dL (ref 6–23)
CO2: 21 meq/L (ref 19–32)
Calcium: 9.3 mg/dL (ref 8.4–10.5)
Chloride: 102 meq/L (ref 96–112)
Creatinine, Ser: 1.62 mg/dL — ABNORMAL HIGH (ref 0.40–1.20)
GFR: 31.68 mL/min — ABNORMAL LOW (ref >60.00)
Glucose, Bld: 65 mg/dL — ABNORMAL LOW (ref 70–99)
Potassium: 4.8 meq/L (ref 3.5–5.1)
Sodium: 136 meq/L (ref 135–145)
Total Bilirubin: 0.5 mg/dL (ref 0.2–1.2)
Total Protein: 6.7 g/dL (ref 6.0–8.3)

## 2023-08-29 LAB — CBC WITH DIFFERENTIAL/PLATELET
Basophils Absolute: 0.1 K/uL (ref 0.0–0.1)
Basophils Relative: 0.9 % (ref 0.0–3.0)
Eosinophils Absolute: 0.2 K/uL (ref 0.0–0.7)
Eosinophils Relative: 2.7 % (ref 0.0–5.0)
HCT: 33.9 % — ABNORMAL LOW (ref 36.0–46.0)
Hemoglobin: 11.3 g/dL — ABNORMAL LOW (ref 12.0–15.0)
Lymphocytes Relative: 34.4 % (ref 12.0–46.0)
Lymphs Abs: 2.2 K/uL (ref 0.7–4.0)
MCHC: 33.3 g/dL (ref 30.0–36.0)
MCV: 96.9 fl (ref 78.0–100.0)
Monocytes Absolute: 0.7 K/uL (ref 0.1–1.0)
Monocytes Relative: 10.9 % (ref 3.0–12.0)
Neutro Abs: 3.4 K/uL (ref 1.4–7.7)
Neutrophils Relative %: 51.1 % (ref 43.0–77.0)
Platelets: 334 K/uL (ref 150.0–400.0)
RBC: 3.5 Mil/uL — ABNORMAL LOW (ref 3.87–5.11)
RDW: 13.4 % (ref 11.5–15.5)
WBC: 6.5 K/uL (ref 4.0–10.5)

## 2023-08-29 LAB — URINE CULTURE
MICRO NUMBER:: 16819655
Result:: NO GROWTH
SPECIMEN QUALITY:: ADEQUATE

## 2023-08-29 LAB — LIPID PANEL
Cholesterol: 178 mg/dL (ref 0–200)
HDL: 35.8 mg/dL — ABNORMAL LOW (ref >39.00)
LDL Cholesterol: 92 mg/dL (ref 0–99)
NonHDL: 141.91
Total CHOL/HDL Ratio: 5
Triglycerides: 252 mg/dL — ABNORMAL HIGH (ref 0.0–149.0)
VLDL: 50.4 mg/dL — ABNORMAL HIGH (ref 0.0–40.0)

## 2023-08-29 LAB — PHOSPHORUS: Phosphorus: 4.1 mg/dL (ref 2.3–4.6)

## 2023-08-29 LAB — TSH: TSH: 1.51 u[IU]/mL (ref 0.35–5.50)

## 2023-08-29 NOTE — Transitions of Care (Post Inpatient/ED Visit) (Signed)
   08/29/2023  Name: Dominique Hayes MRN: [id number] DOB: Feb 23, 1950  Today's TOC FU Call Status: Today's TOC FU Call Status:: Unsuccessful Call (3rd Attempt) Unsuccessful Call (2nd Attempt) Date: 08/28/23 Unsuccessful Call (3rd Attempt) Date: 08/29/23  Attempted to reach the patient regarding the most recent Inpatient/ED visit.  Follow Up Plan: No further outreach attempts will be made at this time. We have been unable to contact the patient.  Medford Balboa, BSN, RN [place]  [facility] - [facility] RN Care Manager [phone]

## 2023-08-30 LAB — MAGNESIUM: Magnesium: 1.7 mg/dL (ref 1.5–2.5)

## 2023-08-30 MED ORDER — CEPHALEXIN 500 MG PO CAPS: 500.0000 mg | ORAL_CAPSULE | Freq: Two times a day (BID) | ORAL | 0 refills | Status: AC

## 2023-11-22 ENCOUNTER — Ambulatory Visit: Admitting: Family Medicine

## 2023-12-04 ENCOUNTER — Encounter: Payer: Self-pay | Admitting: Family Medicine

## 2023-12-04 ENCOUNTER — Ambulatory Visit: Admitting: Family Medicine

## 2023-12-04 VITALS — BP 132/70 | HR 52 | Temp 98.2°F | Resp 16 | Ht 60.0 in | Wt 119.6 lb

## 2023-12-04 DIAGNOSIS — E119 Type 2 diabetes mellitus without complications: Secondary | ICD-10-CM

## 2023-12-04 DIAGNOSIS — I10 Essential (primary) hypertension: Secondary | ICD-10-CM

## 2023-12-04 DIAGNOSIS — Z1211 Encounter for screening for malignant neoplasm of colon: Secondary | ICD-10-CM

## 2023-12-04 DIAGNOSIS — E785 Hyperlipidemia, unspecified: Secondary | ICD-10-CM | POA: Diagnosis not present

## 2023-12-04 MED ORDER — METOPROLOL TARTRATE 25 MG PO TABS: 25.0000 mg | ORAL_TABLET | Freq: Two times a day (BID) | ORAL | 1 refills | Status: AC

## 2023-12-04 MED ORDER — ICOSAPENT ETHYL 1 G PO CAPS: ORAL_CAPSULE | ORAL | 2 refills | Status: AC

## 2023-12-04 MED ORDER — OLMESARTAN MEDOXOMIL 20 MG PO TABS: 20.0000 mg | ORAL_TABLET | Freq: Every day | ORAL | 1 refills | Status: AC

## 2023-12-04 MED ORDER — AMLODIPINE BESYLATE 10 MG PO TABS: 10.0000 mg | ORAL_TABLET | Freq: Every day | ORAL | 1 refills | Status: AC

## 2023-12-04 MED ORDER — ATORVASTATIN CALCIUM 80 MG PO TABS: 80.0000 mg | ORAL_TABLET | Freq: Every day | ORAL | 1 refills | Status: AC

## 2023-12-04 NOTE — Progress Notes (Signed)
 Subjective:    Patient ID: Dominique Hayes, female    DOB: Jul 04, 1950, 73 y.o.   MRN: [id number]  Chief Complaint  Patient presents with   Hypertension    HPI Patient is in today for htn.  Discussed the use of AI scribe software for clinical note transcription with the patient, who gave verbal consent to proceed.  History of Present Illness Dominique Hayes is a 73 year old female who presents for medication refills and routine follow-up.  She is currently taking olmesartan , metoprolol , Lasipa, paracetamol, and amlodipine . She reports no swelling in her ankles.  She has not had a colonoscopy and is due for a Cologuard test next year in December.  Regarding vaccinations, she has received the first shingles shot but is uncertain about the second one. She recalls possibly receiving it at [facility] and requests confirmation of her vaccination status.    Past Medical History:  Diagnosis Date   DM (diabetes mellitus) (HCC)    Hepatitis B    Hyperlipidemia    Hypertension    Mild diastolic dysfunction     Past Surgical History:  Procedure Laterality Date   ABDOMINAL HYSTERECTOMY     TAH-- complications from D &C    Family History  Problem Relation Age of Onset   Hypertension Mother    Hypertension Father    Hypertension Sister    Hypertension Sister     Social History   Socioeconomic History   Marital status: Married    Spouse name: Not on file   Number of children: 1   Years of education: Not on file   Highest education level: Not on file  Occupational History   Occupation: nail tech  Tobacco Use   Smoking status: Never   Smokeless tobacco: Never  Substance and Sexual Activity   Alcohol use: No    Alcohol/week: 0.0 standard drinks of alcohol   Drug use: No   Sexual activity: Yes  Other Topics Concern   Not on file  Social History Narrative   Marital status: married      Employment: employed; p/t chief strategy officer   Exercise--- treadmill everday for 30 min   Social  Drivers of Corporate Investment Banker Strain: Not on file  Food Insecurity: Patient Unable To Answer (08/23/2023)   Hunger Vital Sign    Worried About Running Out of Food in the Last Year: Patient unable to answer    Ran Out of Food in the Last Year: Patient unable to answer  Transportation Needs: Patient Unable To Answer (08/23/2023)   PRAPARE - Administrator, Civil Service (Medical): Patient unable to answer    Lack of Transportation (Non-Medical): Patient unable to answer  Physical Activity: Not on file  Stress: Not on file  Social Connections: Moderately Isolated (08/25/2023)   Social Connection and Isolation Panel    Frequency of Communication with Friends and Family: More than three times a week    Frequency of Social Gatherings with Friends and Family: Twice a week    Attends Religious Services: Never    Database Administrator or Organizations: No    Attends Banker Meetings: Never    Marital Status: Married  Catering Manager Violence: Not At Risk (08/25/2023)   Humiliation, Afraid, Rape, and Kick questionnaire    Fear of Current or Ex-Partner: No    Emotionally Abused: No    Physically Abused: No    Sexually Abused: No    Outpatient  Medications Prior to Visit  Medication Sig Dispense Refill   acetaminophen  (TYLENOL ) 325 MG tablet Take 2 tablets (650 mg total) by mouth every 6 (six) hours as needed for mild pain (pain score 1-3) or fever (temp > 101.5). 20 tablet 0   chlorthalidone  (HYGROTON ) 25 MG tablet Take 1 tablet (25 mg total) by mouth daily. 90 tablet 1   docusate sodium  (COLACE) 100 MG capsule Take 1 capsule (100 mg total) by mouth 2 (two) times daily as needed for mild constipation. 10 capsule 0   folic acid  (FOLVITE ) 1 MG tablet Take 1 tablet (1 mg total) by mouth daily. 30 tablet 0   polyethylene glycol powder (GLYCOLAX /MIRALAX ) 17 GM/SCOOP powder mix one scoop  (17 g) in 8 oz liquid and take by mouth daily as needed for moderate constipation.  238 g 0   thiamine  (VITAMIN B1) 100 MG tablet Take 1 tablet (100 mg total) by mouth daily. 30 tablet 0   amLODipine  (NORVASC ) 10 MG tablet Take 1 tablet (10 mg total) by mouth daily. 30 tablet 0   atorvastatin  (LIPITOR) 80 MG tablet Take 1 tablet (80 mg total) by mouth daily. 90 tablet 1   icosapent  Ethyl (VASCEPA ) 1 g capsule TAKE 2 CAPSULES BY MOUTH 2 TIMES DAILY. 120 capsule 2   metoprolol  tartrate (LOPRESSOR ) 25 MG tablet Take 1 tablet (25 mg total) by mouth 2 (two) times daily. 60 tablet 0   olmesartan  (BENICAR ) 20 MG tablet Take 1 tablet (20 mg total) by mouth daily. 90 tablet 1   No facility-administered medications prior to visit.    No Known Allergies  Review of Systems  Constitutional:  Negative for chills, fever and malaise/fatigue.  HENT:  Negative for congestion and hearing loss.   Eyes:  Negative for blurred vision and discharge.  Respiratory:  Negative for cough, sputum production and shortness of breath.   Cardiovascular:  Negative for chest pain, palpitations and leg swelling.  Gastrointestinal:  Negative for abdominal pain, blood in stool, constipation, diarrhea, heartburn, nausea and vomiting.  Genitourinary:  Negative for dysuria, frequency, hematuria and urgency.  Musculoskeletal:  Negative for back pain, falls and myalgias.  Skin:  Negative for rash.  Neurological:  Negative for dizziness, sensory change, loss of consciousness, weakness and headaches.  Endo/Heme/Allergies:  Negative for environmental allergies. Does not bruise/bleed easily.  Psychiatric/Behavioral:  Negative for depression and suicidal ideas. The patient is not nervous/anxious and does not have insomnia.        Objective:    Physical Exam Vitals and nursing note reviewed.  Constitutional:      General: She is not in acute distress.    Appearance: Normal appearance. She is well-developed.  HENT:     Head: Normocephalic and atraumatic.  Eyes:     General: No scleral icterus.       Right  eye: No discharge.        Left eye: No discharge.  Cardiovascular:     Rate and Rhythm: Normal rate and regular rhythm.     Heart sounds: No murmur heard. Pulmonary:     Effort: Pulmonary effort is normal. No respiratory distress.     Breath sounds: Normal breath sounds.  Musculoskeletal:        General: Normal range of motion.     Cervical back: Normal range of motion and neck supple.     Right lower leg: No edema.     Left lower leg: No edema.  Skin:    General: Skin is  warm and dry.  Neurological:     General: No focal deficit present.     Mental Status: She is alert and oriented to person, place, and time.  Psychiatric:        Mood and Affect: Mood normal.        Behavior: Behavior normal.        Thought Content: Thought content normal.        Judgment: Judgment normal.     BP 132/70 (BP Location: Right Arm, Patient Position: Sitting, Cuff Size: Normal)   Pulse (!) 52   Temp 98.2 F (36.8 C) (Oral)   Resp 16   Ht 5' (1.524 m)   Wt 119 lb 9.6 oz (54.3 kg)   SpO2 98%   BMI 23.36 kg/m  Wt Readings from Last 3 Encounters:  12/04/23 119 lb 9.6 oz (54.3 kg)  08/28/23 113 lb 9.6 oz (51.5 kg)  08/25/23 117 lb 8.1 oz (53.3 kg)    Diabetic Foot Exam - Simple   No data filed    Lab Results  Component Value Date   WBC 6.5 08/28/2023   HGB 11.3 (L) 08/28/2023   HCT 33.9 (L) 08/28/2023   PLT 334.0 08/28/2023   GLUCOSE 65 (L) 08/28/2023   CHOL 178 08/28/2023   TRIG 252.0 (H) 08/28/2023   HDL 35.80 (L) 08/28/2023   LDLDIRECT 137.0 03/30/2022   LDLCALC 92 08/28/2023   ALT 19 08/28/2023   AST 29 08/28/2023   NA 136 08/28/2023   K 4.8 08/28/2023   CL 102 08/28/2023   CREATININE 1.62 (H) 08/28/2023   BUN 19 08/28/2023   CO2 21 08/28/2023   TSH 1.51 08/28/2023   HGBA1C 6.7 (H) 05/22/2023   MICROALBUR 1.4 05/22/2023    Lab Results  Component Value Date   TSH 1.51 08/28/2023   Lab Results  Component Value Date   WBC 6.5 08/28/2023   HGB 11.3 (L) 08/28/2023    HCT 33.9 (L) 08/28/2023   MCV 96.9 08/28/2023   PLT 334.0 08/28/2023   Lab Results  Component Value Date   NA 136 08/28/2023   K 4.8 08/28/2023   CO2 21 08/28/2023   GLUCOSE 65 (L) 08/28/2023   BUN 19 08/28/2023   CREATININE 1.62 (H) 08/28/2023   BILITOT 0.5 08/28/2023   ALKPHOS 42 08/28/2023   AST 29 08/28/2023   ALT 19 08/28/2023   PROT 6.7 08/28/2023   ALBUMIN 3.9 08/28/2023   CALCIUM  9.3 08/28/2023   ANIONGAP 7 08/25/2023   GFR 31.68 (L) 08/28/2023   Lab Results  Component Value Date   CHOL 178 08/28/2023   Lab Results  Component Value Date   HDL 35.80 (L) 08/28/2023   Lab Results  Component Value Date   LDLCALC 92 08/28/2023   Lab Results  Component Value Date   TRIG 252.0 (H) 08/28/2023   Lab Results  Component Value Date   CHOLHDL 5 08/28/2023   Lab Results  Component Value Date   HGBA1C 6.7 (H) 05/22/2023       Assessment & Plan:  Hyperlipidemia LDL goal <70 -     Atorvastatin  Calcium ; Take 1 tablet (80 mg total) by mouth daily.  Dispense: 90 tablet; Refill: 1 -     Icosapent  Ethyl; TAKE 2 CAPSULES BY MOUTH 2 TIMES DAILY.  Dispense: 120 capsule; Refill: 2 -     Lipid panel -     Comprehensive metabolic panel with GFR  Primary hypertension -     amLODIPine  Besylate; Take  1 tablet (10 mg total) by mouth daily.  Dispense: 90 tablet; Refill: 1 -     Metoprolol  Tartrate; Take 1 tablet (25 mg total) by mouth 2 (two) times daily.  Dispense: 180 tablet; Refill: 1 -     Olmesartan  Medoxomil; Take 1 tablet (20 mg total) by mouth daily.  Dispense: 90 tablet; Refill: 1 -     Lipid panel -     CBC with Differential/Platelet  Colon cancer screening -     Cologuard  Controlled type 2 diabetes mellitus without complication, without long-term current use of insulin  (HCC) -     Comprehensive metabolic panel with GFR -     Hemoglobin A1c  Assessment and Plan Assessment & Plan Essential hypertension   Blood pressure is well-controlled with the current  medication regimen. No ankle swelling reported. Continue olmesartan , metoprolol , Lasipa, paracetamol, and amlodipine . Prescriptions for antihypertensive medications have been refilled.  General Health Maintenance   Colonoscopy not yet performed. Cologuard test is due next year. Uncertain if the second shingles vaccine was administered. Will send Cologuard test for next year and verify administration of the second shingles vaccine.    Jeydi Klingel R Lowne Chase, DO

## 2023-12-05 LAB — CBC WITH DIFFERENTIAL/PLATELET
Basophils Absolute: 0.1 K/uL (ref 0.0–0.1)
Basophils Relative: 1.2 % (ref 0.0–3.0)
Eosinophils Absolute: 0.1 K/uL (ref 0.0–0.7)
Eosinophils Relative: 2.1 % (ref 0.0–5.0)
HCT: 36.8 % (ref 36.0–46.0)
Hemoglobin: 12.4 g/dL (ref 12.0–15.0)
Lymphocytes Relative: 23.1 % (ref 12.0–46.0)
Lymphs Abs: 1.6 K/uL (ref 0.7–4.0)
MCHC: 33.7 g/dL (ref 30.0–36.0)
MCV: 93.3 fl (ref 78.0–100.0)
Monocytes Absolute: 0.5 K/uL (ref 0.1–1.0)
Monocytes Relative: 7.8 % (ref 3.0–12.0)
Neutro Abs: 4.7 K/uL (ref 1.4–7.7)
Neutrophils Relative %: 65.8 % (ref 43.0–77.0)
Platelets: 287 K/uL (ref 150.0–400.0)
RBC: 3.95 Mil/uL (ref 3.87–5.11)
RDW: 13.9 % (ref 11.5–15.5)
WBC: 7.1 K/uL (ref 4.0–10.5)

## 2023-12-05 LAB — LIPID PANEL
Cholesterol: 260 mg/dL — ABNORMAL HIGH (ref 0–200)
HDL: 35 mg/dL — ABNORMAL LOW (ref >39.00)
LDL Cholesterol: 159 mg/dL — ABNORMAL HIGH (ref 0–99)
NonHDL: 224.64
Total CHOL/HDL Ratio: 7
Triglycerides: 329 mg/dL — ABNORMAL HIGH (ref 0.0–149.0)
VLDL: 65.8 mg/dL — ABNORMAL HIGH (ref 0.0–40.0)

## 2023-12-05 LAB — COMPREHENSIVE METABOLIC PANEL WITH GFR
ALT: 43 U/L — ABNORMAL HIGH (ref 0–35)
AST: 49 U/L — ABNORMAL HIGH (ref 0–37)
Albumin: 4.4 g/dL (ref 3.5–5.2)
Alkaline Phosphatase: 92 U/L (ref 39–117)
BUN: 22 mg/dL (ref 6–23)
CO2: 25 meq/L (ref 19–32)
Calcium: 9.4 mg/dL (ref 8.4–10.5)
Chloride: 101 meq/L (ref 96–112)
Creatinine, Ser: 1.21 mg/dL — ABNORMAL HIGH (ref 0.40–1.20)
GFR: 44.88 mL/min — ABNORMAL LOW (ref >60.00)
Glucose, Bld: 114 mg/dL — ABNORMAL HIGH (ref 70–99)
Potassium: 4.5 meq/L (ref 3.5–5.1)
Sodium: 137 meq/L (ref 135–145)
Total Bilirubin: 0.5 mg/dL (ref 0.2–1.2)
Total Protein: 7.5 g/dL (ref 6.0–8.3)

## 2023-12-05 LAB — HEMOGLOBIN A1C: Hgb A1c MFr Bld: 6.3 % (ref 4.6–6.5)

## 2023-12-06 ENCOUNTER — Ambulatory Visit: Payer: Self-pay | Admitting: Family Medicine

## 2023-12-06 DIAGNOSIS — E785 Hyperlipidemia, unspecified: Secondary | ICD-10-CM

## 2023-12-19 ENCOUNTER — Other Ambulatory Visit: Payer: Self-pay | Admitting: Family Medicine

## 2023-12-19 DIAGNOSIS — I10 Essential (primary) hypertension: Secondary | ICD-10-CM

## 2023-12-19 NOTE — Telephone Encounter (Signed)
 Pt's chart med is paused. Okay to refill?

## 2023-12-24 ENCOUNTER — Other Ambulatory Visit: Payer: Self-pay | Admitting: Family Medicine

## 2023-12-24 DIAGNOSIS — E119 Type 2 diabetes mellitus without complications: Secondary | ICD-10-CM

## 2024-01-01 NOTE — Telephone Encounter (Signed)
 LMOM asking for call back.

## 2024-01-03 ENCOUNTER — Other Ambulatory Visit (HOSPITAL_COMMUNITY): Payer: Self-pay

## 2024-01-03 ENCOUNTER — Telehealth: Payer: Self-pay | Admitting: Pharmacy Technician

## 2024-01-03 ENCOUNTER — Ambulatory Visit (HOSPITAL_BASED_OUTPATIENT_CLINIC_OR_DEPARTMENT_OTHER): Admitting: Internal Medicine

## 2024-01-03 VITALS — BP 108/66 | HR 55 | Ht 60.0 in | Wt 117.9 lb

## 2024-01-03 DIAGNOSIS — E119 Type 2 diabetes mellitus without complications: Secondary | ICD-10-CM | POA: Diagnosis not present

## 2024-01-03 DIAGNOSIS — E7849 Other hyperlipidemia: Secondary | ICD-10-CM | POA: Diagnosis not present

## 2024-01-03 DIAGNOSIS — I10 Essential (primary) hypertension: Secondary | ICD-10-CM | POA: Diagnosis not present

## 2024-01-03 DIAGNOSIS — E785 Hyperlipidemia, unspecified: Secondary | ICD-10-CM

## 2024-01-03 MED ORDER — REPATHA SURECLICK 140 MG/ML ~~LOC~~ SOAJ: 140.0000 mg | SUBCUTANEOUS | 6 refills | Status: AC

## 2024-01-03 NOTE — Progress Notes (Unsigned)
 LIPID CLINIC CONSULT NOTE  Chief Complaint:  Dyslipidemia  Primary Care Physician: Dominique Meth, Jamee SAUNDERS, DO  Primary Cardiologist:  None  HPI:  Dominique Hayes is a 73 y.o. female who is being seen today for the evaluation of dyslipidemia at the request of Dominique Meth Jamee SAUNDERS, DO.  This is a 73 year old Vietnamese patient seen with a professional radiation protection practitioner today.  She is referred for evaluation and management of dyslipidemia.  She has a history of high cholesterol and has been on 80 mg atorvastatin  and Vascepa  with recent labs that showed total cholesterol 260, triglycerides 329, HDL 35 and LDL 159.  Her husband verified that she has been compliant with her medications.  This is suggestive of familial hyperlipidemia as her LDL untreated would be greater than 190.  She does not know however her family history of high cholesterol.  She is a vegetarian.  She has no known heart disease.  She does have risk factors include type 2 diabetes and hypertension with a target LDL less than 70.  PMHx:  Past Medical History:  Diagnosis Date   DM (diabetes mellitus) (HCC)    Hepatitis B    Hyperlipidemia    Hypertension    Mild diastolic dysfunction     Past Surgical History:  Procedure Laterality Date   ABDOMINAL HYSTERECTOMY     TAH-- complications from D &C    FAMHx:  Family History  Problem Relation Age of Onset   Hypertension Mother    Hypertension Father    Hypertension Sister    Hypertension Sister     SOCHx:   reports that she has never smoked. She has never used smokeless tobacco. She reports that she does not drink alcohol and does not use drugs.  ALLERGIES:  Allergies[1]  ROS: Pertinent items noted in HPI and remainder of comprehensive ROS otherwise negative.  HOME MEDS: Medications Ordered Prior to Encounter[2]  LABS/IMAGING: No results found for this or any previous visit (from the past 48 hours). No results found.  LIPID PANEL:    Component Value  Date/Time   CHOL 260 (H) 12/04/2023 1344   TRIG 329.0 (H) 12/04/2023 1344   HDL 35.00 (L) 12/04/2023 1344   CHOLHDL 7 12/04/2023 1344   VLDL 65.8 (H) 12/04/2023 1344   LDLCALC 159 (H) 12/04/2023 1344   LDLDIRECT 137.0 03/30/2022 1124    No results found for: LIPOA   WEIGHTS: Wt Readings from Last 3 Encounters:  01/03/24 117 lb 14.4 oz (53.5 kg)  12/04/23 119 lb 9.6 oz (54.3 kg)  08/28/23 113 lb 9.6 oz (51.5 kg)    VITALS: BP 108/66   Pulse (!) 55   Ht 5' (1.524 m)   Wt 117 lb 14.4 oz (53.5 kg)   SpO2 98%   BMI 23.03 kg/m   EXAM: Deferred  EKG: Deferred  ASSESSMENT: Dyslipidemia, goal LDL less than 70 Possible familial hyperlipidemia Type 2 diabetes Hypertension  PLAN: 1.   Dominique Hayes has a dyslipidemia and is above target LDL less than 70.  I suspect she might have familial hyperlipidemia or combined hyperlipidemia with high triglycerides and high LDL cholesterol.  She is ready on high dose statin therapy and Vascepa .  Would recommend adding a PCSK9 inhibitor to drive LDL closer to 70.  I discussed the mechanism of action including risks and benefits with her today which was translated professionally and she is agreeable to starting the medication.  Will reach out for prior authorization for this.  She has Medicare Medicaid and I suspect this will be well covered although she might qualify for health well grant as well.  Follow-up with repeat lipids in about 3 to 4 months.  Thanks again for the kind referral.  Dominique KYM Maxcy, MD, [facility], FNLA, FACP  [place]  [facility]  Medical Director of the Advanced Lipid Disorders &  Cardiovascular Risk Reduction Clinic Diplomate of the American Board of Clinical Lipidology Attending Cardiologist  Direct Dial: [phone]  Fax: [phone]  Website:  [email or web address]  Dominique Hayes 01/03/2024, 2:12 PM     [1] No Known Allergies [2]  Current Outpatient Medications on File Prior to Visit  Medication Sig  Dispense Refill   acetaminophen  (TYLENOL ) 325 MG tablet Take 2 tablets (650 mg total) by mouth every 6 (six) hours as needed for mild pain (pain score 1-3) or fever (temp > 101.5). 20 tablet 0   amLODipine  (NORVASC ) 10 MG tablet Take 1 tablet (10 mg total) by mouth daily. 90 tablet 1   atorvastatin  (LIPITOR) 80 MG tablet Take 1 tablet (80 mg total) by mouth daily. 90 tablet 1   docusate sodium  (COLACE) 100 MG capsule Take 1 capsule (100 mg total) by mouth 2 (two) times daily as needed for mild constipation. 10 capsule 0   icosapent  Ethyl (VASCEPA ) 1 g capsule TAKE 2 CAPSULES BY MOUTH 2 TIMES DAILY. 120 capsule 2   metoprolol  tartrate (LOPRESSOR ) 25 MG tablet Take 1 tablet (25 mg total) by mouth 2 (two) times daily. 180 tablet 1   olmesartan  (BENICAR ) 20 MG tablet Take 1 tablet (20 mg total) by mouth daily. 90 tablet 1   thiamine  (VITAMIN B1) 100 MG tablet Take 1 tablet (100 mg total) by mouth daily. 30 tablet 0   [Paused] chlorthalidone  (HYGROTON ) 25 MG tablet Take 1 tablet (25 mg total) by mouth daily. (Patient not taking: Reported on 01/03/2024) 90 tablet 1   folic acid  (FOLVITE ) 1 MG tablet Take 1 tablet (1 mg total) by mouth daily. (Patient not taking: Reported on 01/03/2024) 30 tablet 0   polyethylene glycol powder (GLYCOLAX /MIRALAX ) 17 GM/SCOOP powder mix one scoop  (17 g) in 8 oz liquid and take by mouth daily as needed for moderate constipation. (Patient not taking: Reported on 01/03/2024) 238 g 0   No current facility-administered medications on file prior to visit.

## 2024-01-03 NOTE — Patient Instructions (Signed)
 Medication Instructions:  Dr. Mona recommends Repatha  (PCSK9). This is an injectable cholesterol medication self-administered once every 14 days. This medication will likely need prior approval with your insurance company, which we will work on. If the medication is not approved initially, we may need to do an appeal with your insurance. If approved, we will provide you with copay and cost information. We'll then send the prescription to your pharmacy. We would have you complete another set of fasting labs between 3-4 months to reassess cholesterol.   Repatha  is self-injected once every 14 days in subcutaneous or fatty tissue - such as belly or side/outer/upper thigh. It is best stored in the refrigerator but is stable at room temp up to 28 days. Please take the pen-injector out of fridge about 30 minutes - 1 hour prior to injection, to allow it to warm closer to room temperature.  PCSK9 (a protein) binds to LDL receptors in the liver which results in breakdown of the LDL receptor. This prevents the liver from clearing out LDL (bad cholesterol). Repatha  is a PCSK9 inhibitor, meaning it blocks this pathway.   Most common side effects:  Injection site reaction (similar findings in study group with medication and placebo group without medication) Runny nose, sore throat or symptoms of common cold which are often self-limiting and improve after subsequent injections as your body gets accustomed to the medication.    Here is a demo video: https://www.repatha .com/how-to-start-repatha -injection   If you need a co-pay card for Repatha : https://www.repatha .com/repatha -cost If you need a co-pay card for Praluent: remodelingdvds.fi  Patient Assistance:    These foundations have funds at various times.   The [employer] Foundation: https://www.panfoundation.org/disease-funds/hypercholesterolemia/ -- can sign up for wait list  The [facility] offers  assistance to help pay for medication copays.  They will cover copays for all cholesterol lowering meds, including statins, fibrates, omega-3 fish oils like Vascepa , ezetimibe , Repatha , Praluent, Nexletol, Nexlizet.  The cards are usually good for $2,500 or 12 months, whichever comes first. Our fax # is [phone] (you will need this to apply) Go to healthwellfoundation.org Click on Apply Now Answer questions as to whom is applying (patient or representative) Your disease fund will be hypercholesterolemia - Medicare access They will ask questions about finances and which medications you are taking for cholesterol When you submit, the approval is usually within minutes.  You will need to print the card information from the site You will need to show this information to your pharmacy, they will bill your Medicare Part D plan first -then bill Health Well --for the copay.   You can also call them at [phone], although the hold times can be quite long.     *If you need a refill on your cardiac medications before your next appointment, please call your pharmacy*  Lab Work:  FASTING lab work in 3-4 months -- NMR lipoprofile and LPa  If you have labs (blood work) drawn today and your tests are completely normal, you will receive your results only by: MyChart Message (if you have MyChart) OR A paper copy in the mail If you have any lab test that is abnormal or we need to change your treatment, we will call you to review the results.   Follow-Up: At [facility], you and your health needs are our priority.  As part of our continuing mission to provide you with exceptional heart care, our providers are all part of one team.  This team includes your primary Cardiologist (physician) and Advanced Practice  Providers or APPs (Physician Assistants and Nurse Practitioners) who all work together to provide you with the care you need, when you need it.  Your next appointment:     3-4  months with Dr. Mona or Rosaline Bane NP -- lipid clinic  We recommend signing up for the patient portal called MyChart.  Sign up information is provided on this After Visit Summary.  MyChart is used to connect with patients for Virtual Visits (Telemedicine).  Patients are able to view lab/test results, encounter notes, upcoming appointments, etc.  Non-urgent messages can be sent to your provider as well.   To learn more about what you can do with MyChart, go to [email or web address].   Other Instructions

## 2024-01-03 NOTE — Telephone Encounter (Addendum)
° °  Get healthwell grant if needed  Pharmacy Patient Advocate Encounter   Received notification from staff-jenna that prior authorization for repatha  is required/requested.   Insurance verification completed.   The patient is insured through [facility].   Per test claim: PA required; PA submitted to above mentioned insurance via Latent Key/confirmation #/EOC [id number] Status is pending

## 2024-01-03 NOTE — Telephone Encounter (Addendum)
 Pharmacy Patient Advocate Encounter  Received notification from [facility] that Prior Authorization for repatha  has been APPROVED from 01/03/24 to 07/03/24. I called her pharmacy and her Copay is $0.00 on insurance.     PA #/Case ID/Reference #: [id number]

## 2024-02-12 LAB — HM DIABETES EYE EXAM

## 2024-02-22 ENCOUNTER — Other Ambulatory Visit: Payer: Self-pay | Admitting: Family Medicine

## 2024-02-22 DIAGNOSIS — I10 Essential (primary) hypertension: Secondary | ICD-10-CM

## 2024-02-22 DIAGNOSIS — E785 Hyperlipidemia, unspecified: Secondary | ICD-10-CM

## 2024-04-02 ENCOUNTER — Encounter (HOSPITAL_BASED_OUTPATIENT_CLINIC_OR_DEPARTMENT_OTHER): Admitting: Nurse Practitioner

## 2024-06-02 ENCOUNTER — Ambulatory Visit: Admitting: Family Medicine
